# Patient Record
Sex: Male | Born: 1935 | Race: White | Hispanic: No | Marital: Married | Smoking: Former smoker
Health system: Southern US, Community
[De-identification: ages and names within clinical notes are randomized; demographics above are authoritative.]

## PROBLEM LIST (undated history)

## (undated) DIAGNOSIS — I714 Abdominal aortic aneurysm, without rupture, unspecified: Secondary | ICD-10-CM

## (undated) DIAGNOSIS — K5900 Constipation, unspecified: Secondary | ICD-10-CM

## (undated) DIAGNOSIS — K469 Unspecified abdominal hernia without obstruction or gangrene: Secondary | ICD-10-CM

## (undated) DIAGNOSIS — C4490 Unspecified malignant neoplasm of skin, unspecified: Secondary | ICD-10-CM

## (undated) DIAGNOSIS — J449 Chronic obstructive pulmonary disease, unspecified: Secondary | ICD-10-CM

## (undated) DIAGNOSIS — R7303 Prediabetes: Secondary | ICD-10-CM

## (undated) DIAGNOSIS — I77819 Aortic ectasia, unspecified site: Secondary | ICD-10-CM

## (undated) DIAGNOSIS — E039 Hypothyroidism, unspecified: Secondary | ICD-10-CM

## (undated) DIAGNOSIS — I10 Essential (primary) hypertension: Secondary | ICD-10-CM

## (undated) DIAGNOSIS — E785 Hyperlipidemia, unspecified: Secondary | ICD-10-CM

## (undated) DIAGNOSIS — E559 Vitamin D deficiency, unspecified: Secondary | ICD-10-CM

## (undated) DIAGNOSIS — I2510 Atherosclerotic heart disease of native coronary artery without angina pectoris: Secondary | ICD-10-CM

## (undated) DIAGNOSIS — M779 Enthesopathy, unspecified: Secondary | ICD-10-CM

## (undated) DIAGNOSIS — I739 Peripheral vascular disease, unspecified: Secondary | ICD-10-CM

## (undated) HISTORY — DX: Abdominal aortic aneurysm, without rupture: I71.4

## (undated) HISTORY — PX: PNEUMONECTOMY: SHX168

## (undated) HISTORY — DX: Essential (primary) hypertension: I10

## (undated) HISTORY — PX: PILONIDAL CYST / SINUS EXCISION: SUR543

## (undated) HISTORY — DX: Abdominal aortic aneurysm, without rupture, unspecified: I71.40

## (undated) HISTORY — DX: Enthesopathy, unspecified: M77.9

## (undated) HISTORY — DX: Peripheral vascular disease, unspecified: I73.9

## (undated) HISTORY — PX: KNEE SURGERY: SHX244

## (undated) HISTORY — PX: CORONARY ARTERY BYPASS GRAFT: SHX141

## (undated) HISTORY — DX: Unspecified abdominal hernia without obstruction or gangrene: K46.9

## (undated) HISTORY — PX: CATARACT EXTRACTION: SUR2

## (undated) HISTORY — DX: Chronic obstructive pulmonary disease, unspecified: J44.9

## (undated) HISTORY — DX: Prediabetes: R73.03

## (undated) HISTORY — DX: Aortic ectasia, unspecified site: I77.819

## (undated) HISTORY — DX: Unspecified malignant neoplasm of skin, unspecified: C44.90

## (undated) HISTORY — DX: Hypothyroidism, unspecified: E03.9

## (undated) HISTORY — DX: Hyperlipidemia, unspecified: E78.5

## (undated) HISTORY — DX: Vitamin D deficiency, unspecified: E55.9

## (undated) HISTORY — DX: Atherosclerotic heart disease of native coronary artery without angina pectoris: I25.10

---

## 2002-01-13 ENCOUNTER — Ambulatory Visit (HOSPITAL_COMMUNITY): Admission: RE | Admit: 2002-01-13 | Discharge: 2002-01-13 | Payer: Self-pay | Admitting: Ophthalmology

## 2002-02-14 ENCOUNTER — Ambulatory Visit (HOSPITAL_COMMUNITY): Admission: RE | Admit: 2002-02-14 | Discharge: 2002-02-15 | Payer: Self-pay | Admitting: Ophthalmology

## 2002-02-14 ENCOUNTER — Encounter: Payer: Self-pay | Admitting: Ophthalmology

## 2002-02-28 ENCOUNTER — Ambulatory Visit (HOSPITAL_COMMUNITY): Admission: RE | Admit: 2002-02-28 | Discharge: 2002-02-28 | Payer: Self-pay | Admitting: *Deleted

## 2002-11-09 ENCOUNTER — Ambulatory Visit (HOSPITAL_COMMUNITY): Admission: RE | Admit: 2002-11-09 | Discharge: 2002-11-09 | Payer: Self-pay | Admitting: Internal Medicine

## 2002-11-09 ENCOUNTER — Encounter: Payer: Self-pay | Admitting: Internal Medicine

## 2002-11-10 ENCOUNTER — Ambulatory Visit (HOSPITAL_COMMUNITY): Admission: RE | Admit: 2002-11-10 | Discharge: 2002-11-10 | Payer: Self-pay | Admitting: Internal Medicine

## 2002-11-10 ENCOUNTER — Encounter: Payer: Self-pay | Admitting: Internal Medicine

## 2002-12-14 ENCOUNTER — Ambulatory Visit (HOSPITAL_COMMUNITY): Admission: RE | Admit: 2002-12-14 | Discharge: 2002-12-14 | Payer: Self-pay | Admitting: Critical Care Medicine

## 2002-12-14 ENCOUNTER — Encounter (INDEPENDENT_AMBULATORY_CARE_PROVIDER_SITE_OTHER): Payer: Self-pay | Admitting: *Deleted

## 2002-12-14 ENCOUNTER — Encounter: Payer: Self-pay | Admitting: Critical Care Medicine

## 2002-12-28 ENCOUNTER — Encounter
Admission: RE | Admit: 2002-12-28 | Discharge: 2002-12-28 | Payer: Self-pay | Admitting: Thoracic Surgery (Cardiothoracic Vascular Surgery)

## 2002-12-28 ENCOUNTER — Encounter: Payer: Self-pay | Admitting: Thoracic Surgery (Cardiothoracic Vascular Surgery)

## 2003-01-17 ENCOUNTER — Encounter: Payer: Self-pay | Admitting: Thoracic Surgery

## 2003-01-18 ENCOUNTER — Encounter: Payer: Self-pay | Admitting: Thoracic Surgery

## 2003-01-18 ENCOUNTER — Encounter (INDEPENDENT_AMBULATORY_CARE_PROVIDER_SITE_OTHER): Payer: Self-pay | Admitting: Specialist

## 2003-01-18 ENCOUNTER — Inpatient Hospital Stay (HOSPITAL_COMMUNITY): Admission: RE | Admit: 2003-01-18 | Discharge: 2003-01-24 | Payer: Self-pay | Admitting: Thoracic Surgery

## 2003-01-19 ENCOUNTER — Encounter: Payer: Self-pay | Admitting: Thoracic Surgery

## 2003-01-20 ENCOUNTER — Encounter: Payer: Self-pay | Admitting: Thoracic Surgery

## 2003-01-21 ENCOUNTER — Encounter: Payer: Self-pay | Admitting: Thoracic Surgery

## 2003-01-22 ENCOUNTER — Encounter: Payer: Self-pay | Admitting: Thoracic Surgery

## 2003-01-23 ENCOUNTER — Encounter: Payer: Self-pay | Admitting: Thoracic Surgery

## 2003-02-01 ENCOUNTER — Encounter: Payer: Self-pay | Admitting: Thoracic Surgery

## 2003-02-01 ENCOUNTER — Encounter: Admission: RE | Admit: 2003-02-01 | Discharge: 2003-02-01 | Payer: Self-pay | Admitting: Thoracic Surgery

## 2003-02-22 ENCOUNTER — Encounter: Admission: RE | Admit: 2003-02-22 | Discharge: 2003-02-22 | Payer: Self-pay | Admitting: Thoracic Surgery

## 2003-02-22 ENCOUNTER — Encounter: Payer: Self-pay | Admitting: Thoracic Surgery

## 2003-04-26 ENCOUNTER — Encounter: Admission: RE | Admit: 2003-04-26 | Discharge: 2003-04-26 | Payer: Self-pay | Admitting: Thoracic Surgery

## 2003-07-26 ENCOUNTER — Encounter: Admission: RE | Admit: 2003-07-26 | Discharge: 2003-07-26 | Payer: Self-pay | Admitting: Thoracic Surgery

## 2003-10-25 ENCOUNTER — Encounter: Admission: RE | Admit: 2003-10-25 | Discharge: 2003-10-25 | Payer: Self-pay | Admitting: Thoracic Surgery

## 2004-01-05 ENCOUNTER — Encounter: Admission: RE | Admit: 2004-01-05 | Discharge: 2004-01-05 | Payer: Self-pay | Admitting: Internal Medicine

## 2004-05-06 IMAGING — CT CT CHEST W/ CM
1 of 2 series · 15 of 32 positions shown, 19 images · IV contrast (agent unspecified)
Comparison: none

FINDINGS
CLINICAL DATA: RIGHT UPPER LOBE MASS.
CON - NONE.
CT CHEST WITH CONTRAST
COMPARISON WITH PRIOR STUDY PERFORMED AT [HOSPITAL] ON 11/10/02.
SPICULATED MASS IN THE RIGHT UPPER LOBE HAS INCREASED SIGNIFICANTLY IN SIZE SINCE THE PRIOR EXAM.
IT NOW MEASURES APPROXIMATELY 4.4 X 3.2 CM IN SIZE.  THERE IS SOME CALCIFICATION WITHIN THAT AREA.
THE SPICULATED COMPONENTS DO EXTEND TO THE PLEURAL SURFACE.  I SUSPECT THIS REPRESENTS A SCAR
CARCINOMA.  THE PATIENT HAS SEVERE CHRONIC INTERSTITIAL AND OBSTRUCTIVE LUNG DISEASE WITH NUMEROUS
BLEBS.  NO FOCAL AREAS OF SEVERE CHRONIC INTERSTITIAL  DISEASE AT BOTH BASES POSTERIORLY AND APPEAR
WORSE THAN ON THE PRIOR STUDY.  HEART SIZE IS NORMAL.  THERE ARE  A FEW SMALL NODES IN THE
MEDIASTINUM IN THE PRECARINAL REGION WHICH APPEAR ESSENTIALLY UNCHANGED.  THERE IS A SINGLE NODE
ADJACENT TO THE RIGHT MAIN PULMONARY ARTERY ON IMAGE #26, WHICH IS A TYPICAL SITE FOR A LYMPH NODE
OFTEN SEEN IN NORMAL PATIENTS, THEREFORE, THIS IS NONSPECIFIC.
IMPRESSION
SIGNIFICANT INCREASE IN THE SIZE OF THE SPICULATED MASS IN THE RIGHT UPPER LOBE CONSISTENT WITH
SCAR CARCINOMA.  INCREASED DENSITY IN THE EXTENSIVE AREAS OF PERIPHERAL BLEB FORMATION IN THE
POSTERIOR ASPECTS OF BOTH LOWER LOBES.  THIS COULD REPRESENT SOME ACUTE INFECTION IN THOSE AREAS OF
SEVERE EMPHYSEMATOUS DISEASE.
COPY TO:  JANCY CANO, M.D.

[Series 2: — · axial · 0.74mm/px · z∈[-240,+35]mm · 15 of 65 slices shown, 19 images]
[im 5/65  mediastinal]
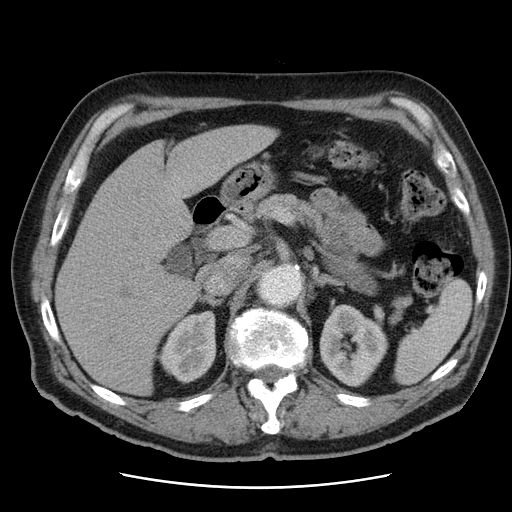
[im 5/65  lung]
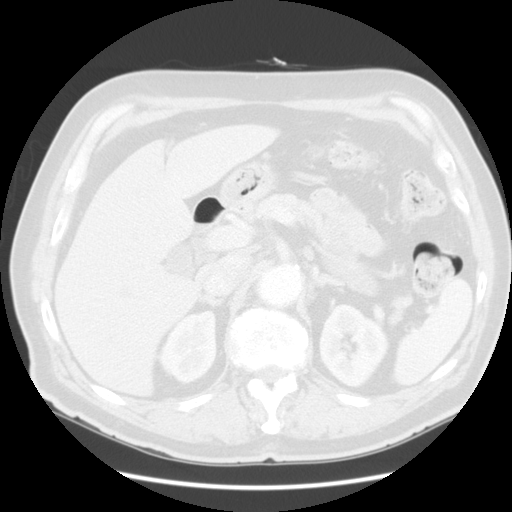
[im 9/65  lung]
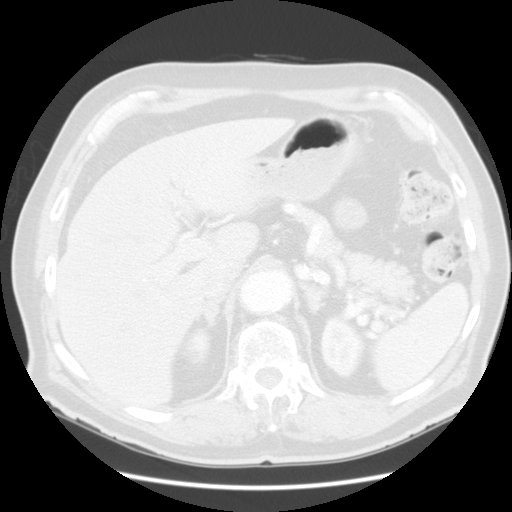
[im 13/65  lung]
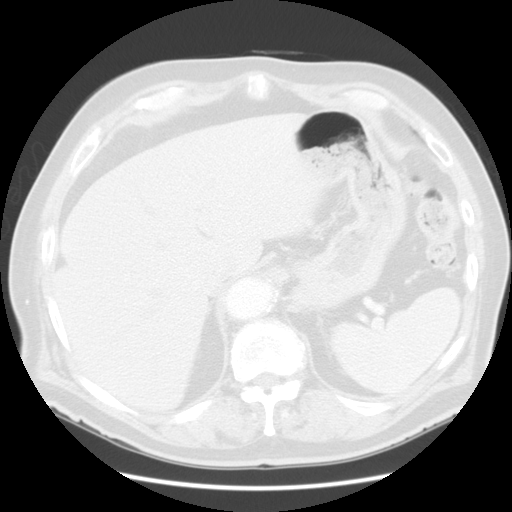
[im 18/65  lung]
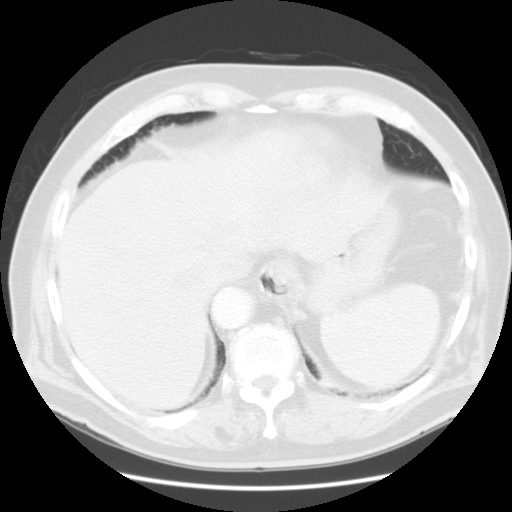
[im 22/65  mediastinal]
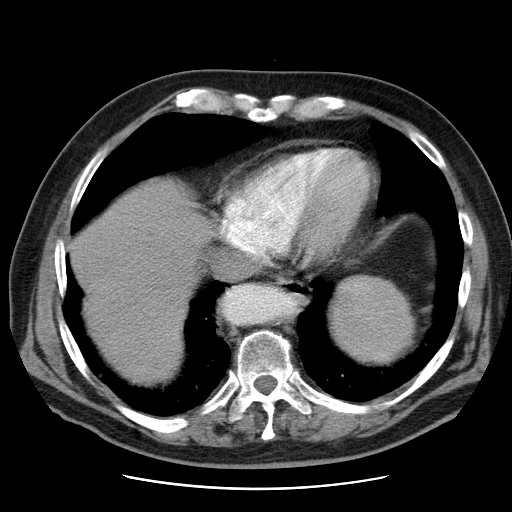
[im 22/65  lung]
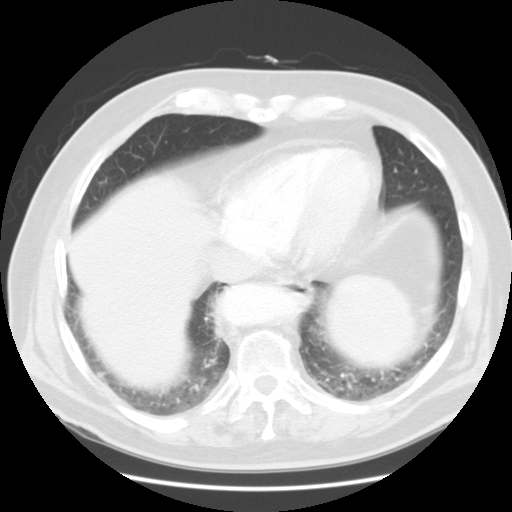
[im 26/65  lung]
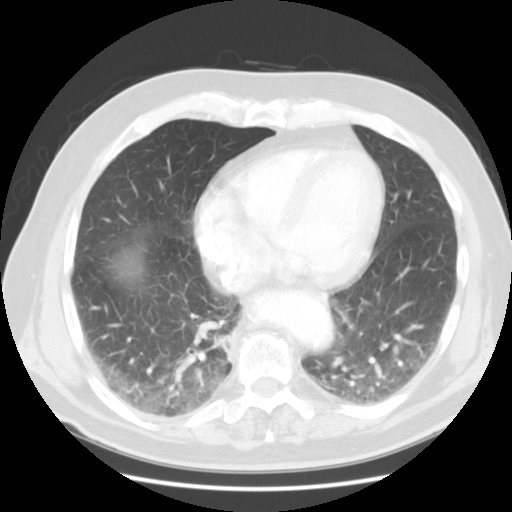
[im 30/65  lung]
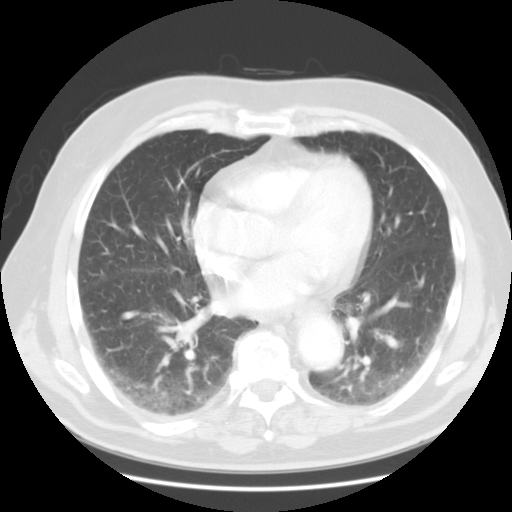
[im 33/65  lung]
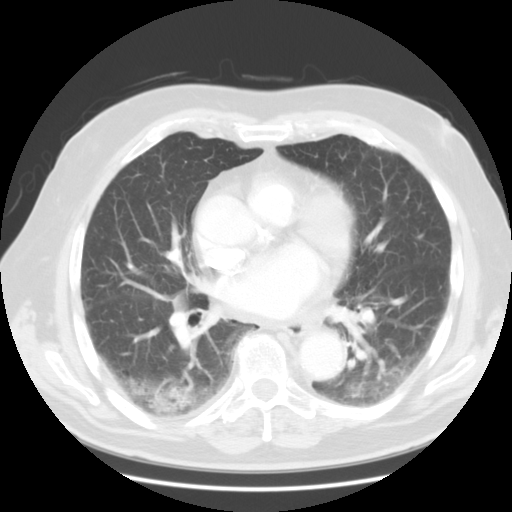
[im 35/65  mediastinal]
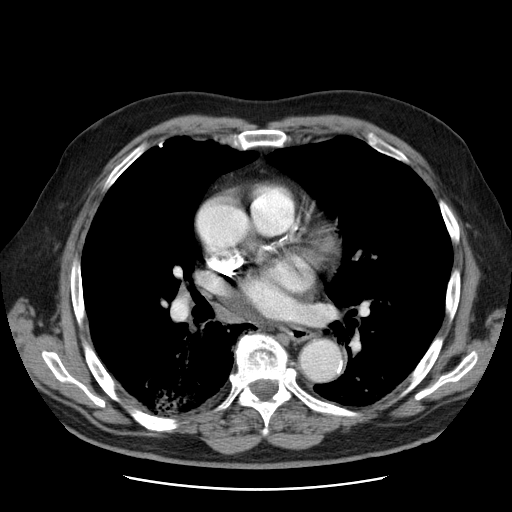
[im 35/65  lung]
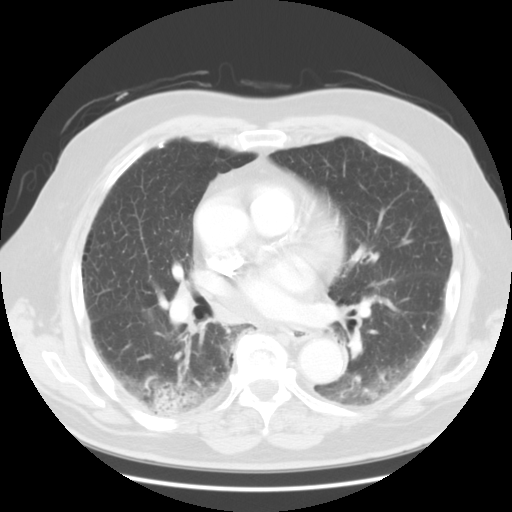
[im 39/65  lung]
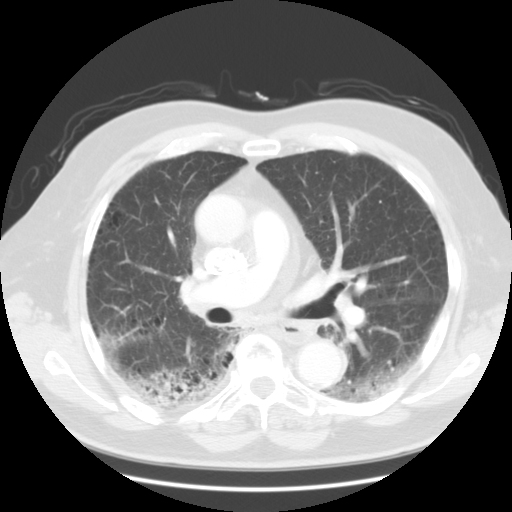
[im 43/65  lung]
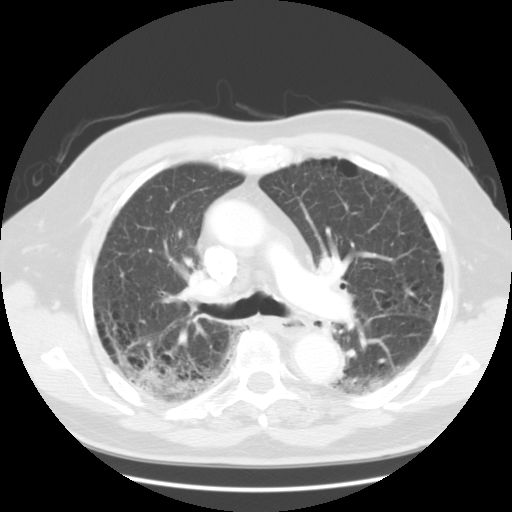
[im 47/65  lung]
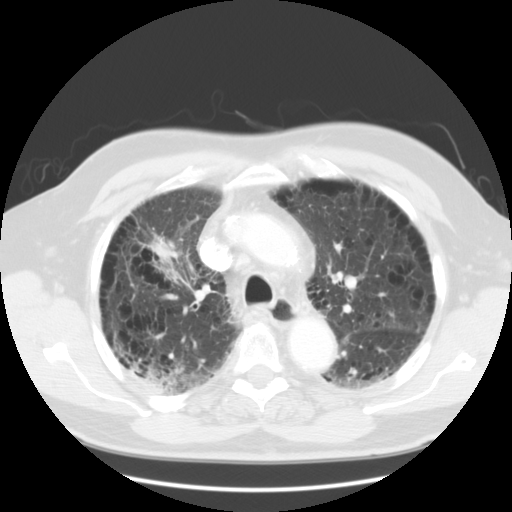
[im 52/65  mediastinal]
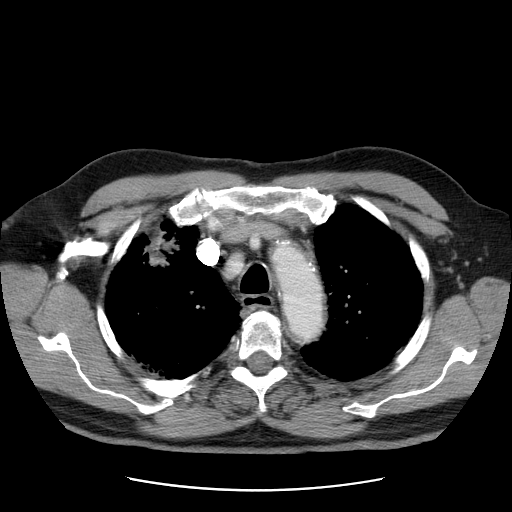
[im 52/65  lung]
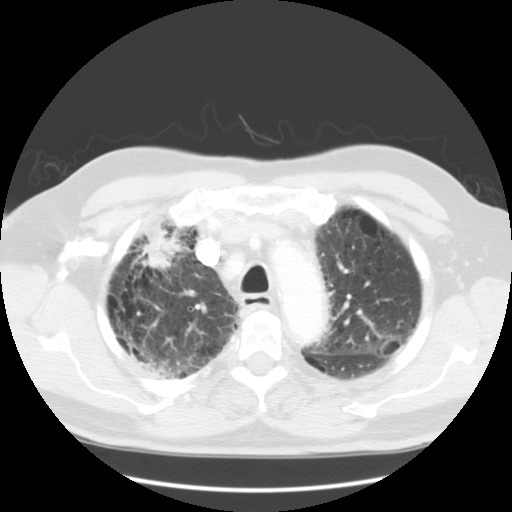
[im 56/65  lung]
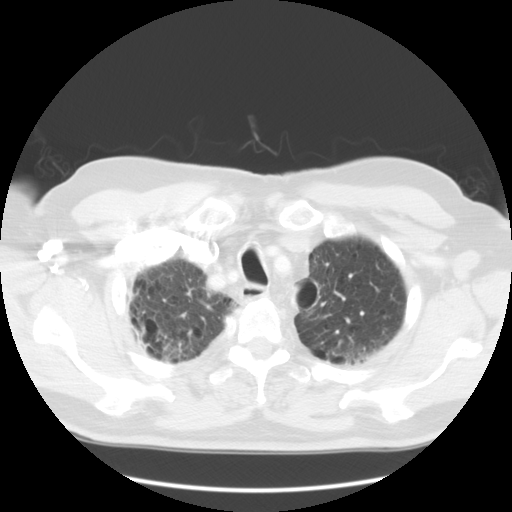
[im 60/65  lung]
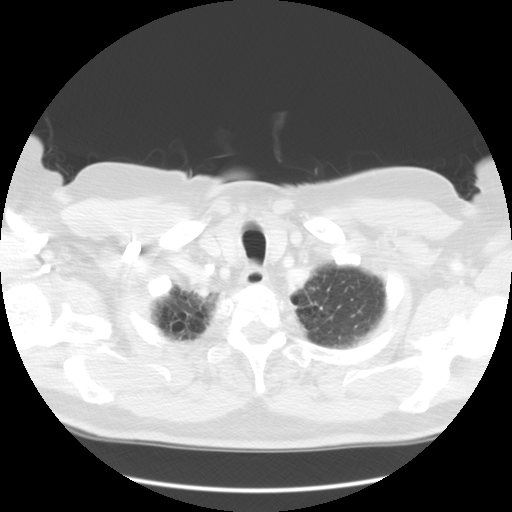

[15 of 32 positions shown; findings below may reference images not displayed]

## 2004-07-03 ENCOUNTER — Encounter: Admission: RE | Admit: 2004-07-03 | Discharge: 2004-07-03 | Payer: Self-pay | Admitting: Thoracic Surgery

## 2005-01-01 ENCOUNTER — Encounter: Admission: RE | Admit: 2005-01-01 | Discharge: 2005-01-01 | Payer: Self-pay | Admitting: Thoracic Surgery

## 2005-01-08 ENCOUNTER — Ambulatory Visit (HOSPITAL_COMMUNITY): Admission: RE | Admit: 2005-01-08 | Discharge: 2005-01-08 | Payer: Self-pay | Admitting: Specialist

## 2005-07-09 ENCOUNTER — Encounter: Admission: RE | Admit: 2005-07-09 | Discharge: 2005-07-09 | Payer: Self-pay | Admitting: Thoracic Surgery

## 2005-08-23 ENCOUNTER — Encounter: Admission: RE | Admit: 2005-08-23 | Discharge: 2005-08-23 | Payer: Self-pay | Admitting: Specialist

## 2005-09-26 ENCOUNTER — Ambulatory Visit: Payer: Self-pay | Admitting: Cardiology

## 2005-10-03 ENCOUNTER — Ambulatory Visit: Payer: Self-pay | Admitting: Critical Care Medicine

## 2005-10-22 ENCOUNTER — Ambulatory Visit: Payer: Self-pay

## 2005-10-31 ENCOUNTER — Ambulatory Visit: Payer: Self-pay | Admitting: Critical Care Medicine

## 2006-01-13 ENCOUNTER — Ambulatory Visit: Payer: Self-pay | Admitting: Pulmonary Disease

## 2006-01-13 ENCOUNTER — Inpatient Hospital Stay (HOSPITAL_COMMUNITY): Admission: RE | Admit: 2006-01-13 | Discharge: 2006-01-19 | Payer: Self-pay | Admitting: Specialist

## 2006-01-14 ENCOUNTER — Ambulatory Visit: Payer: Self-pay | Admitting: Physical Medicine & Rehabilitation

## 2006-01-16 ENCOUNTER — Ambulatory Visit: Payer: Self-pay | Admitting: Cardiology

## 2006-01-16 ENCOUNTER — Encounter: Payer: Self-pay | Admitting: Vascular Surgery

## 2006-01-16 ENCOUNTER — Encounter: Payer: Self-pay | Admitting: Cardiology

## 2006-02-04 ENCOUNTER — Ambulatory Visit: Payer: Self-pay | Admitting: Critical Care Medicine

## 2006-02-09 ENCOUNTER — Ambulatory Visit (HOSPITAL_COMMUNITY): Admission: RE | Admit: 2006-02-09 | Discharge: 2006-02-09 | Payer: Self-pay | Admitting: Specialist

## 2006-02-09 ENCOUNTER — Encounter: Payer: Self-pay | Admitting: Vascular Surgery

## 2007-12-01 ENCOUNTER — Ambulatory Visit (HOSPITAL_COMMUNITY): Admission: RE | Admit: 2007-12-01 | Discharge: 2007-12-01 | Payer: Self-pay | Admitting: Internal Medicine

## 2008-10-26 ENCOUNTER — Ambulatory Visit (HOSPITAL_COMMUNITY): Admission: RE | Admit: 2008-10-26 | Discharge: 2008-10-26 | Payer: Self-pay | Admitting: Internal Medicine

## 2008-11-01 ENCOUNTER — Encounter: Admission: RE | Admit: 2008-11-01 | Discharge: 2008-11-01 | Payer: Self-pay | Admitting: Internal Medicine

## 2008-11-09 ENCOUNTER — Ambulatory Visit (HOSPITAL_COMMUNITY): Admission: RE | Admit: 2008-11-09 | Discharge: 2008-11-09 | Payer: Self-pay | Admitting: Internal Medicine

## 2009-01-08 ENCOUNTER — Ambulatory Visit (HOSPITAL_COMMUNITY): Admission: RE | Admit: 2009-01-08 | Discharge: 2009-01-08 | Payer: Self-pay | Admitting: Internal Medicine

## 2009-01-10 ENCOUNTER — Inpatient Hospital Stay (HOSPITAL_COMMUNITY): Admission: EM | Admit: 2009-01-10 | Discharge: 2009-01-18 | Payer: Self-pay | Admitting: Emergency Medicine

## 2009-01-11 ENCOUNTER — Ambulatory Visit: Payer: Self-pay | Admitting: Surgery

## 2009-01-11 ENCOUNTER — Encounter: Payer: Self-pay | Admitting: Surgery

## 2009-02-06 ENCOUNTER — Ambulatory Visit: Payer: Self-pay | Admitting: Surgery

## 2009-02-06 ENCOUNTER — Encounter: Admission: RE | Admit: 2009-02-06 | Discharge: 2009-02-06 | Payer: Self-pay | Admitting: Surgery

## 2009-09-05 ENCOUNTER — Ambulatory Visit (HOSPITAL_COMMUNITY): Admission: RE | Admit: 2009-09-05 | Discharge: 2009-09-05 | Payer: Self-pay | Admitting: Internal Medicine

## 2009-09-05 ENCOUNTER — Encounter: Payer: Self-pay | Admitting: Cardiology

## 2009-09-20 ENCOUNTER — Encounter: Payer: Self-pay | Admitting: Cardiology

## 2009-10-03 DIAGNOSIS — E039 Hypothyroidism, unspecified: Secondary | ICD-10-CM

## 2009-10-05 ENCOUNTER — Ambulatory Visit: Payer: Self-pay | Admitting: Cardiology

## 2009-10-05 DIAGNOSIS — I739 Peripheral vascular disease, unspecified: Secondary | ICD-10-CM | POA: Insufficient documentation

## 2009-10-05 DIAGNOSIS — E782 Mixed hyperlipidemia: Secondary | ICD-10-CM

## 2009-10-05 DIAGNOSIS — I25810 Atherosclerosis of coronary artery bypass graft(s) without angina pectoris: Secondary | ICD-10-CM

## 2009-10-29 ENCOUNTER — Ambulatory Visit: Payer: Self-pay

## 2009-10-29 ENCOUNTER — Ambulatory Visit (HOSPITAL_COMMUNITY): Admission: RE | Admit: 2009-10-29 | Discharge: 2009-10-29 | Payer: Self-pay | Admitting: Cardiology

## 2009-10-29 ENCOUNTER — Encounter: Payer: Self-pay | Admitting: Cardiology

## 2009-10-29 ENCOUNTER — Ambulatory Visit: Payer: Self-pay | Admitting: Internal Medicine

## 2009-10-29 ENCOUNTER — Encounter (INDEPENDENT_AMBULATORY_CARE_PROVIDER_SITE_OTHER): Payer: Self-pay | Admitting: *Deleted

## 2009-11-05 ENCOUNTER — Encounter: Payer: Self-pay | Admitting: Cardiology

## 2009-11-30 ENCOUNTER — Ambulatory Visit: Payer: Self-pay | Admitting: Cardiology

## 2010-04-11 ENCOUNTER — Ambulatory Visit (HOSPITAL_COMMUNITY): Admission: RE | Admit: 2010-04-11 | Discharge: 2010-04-11 | Payer: Self-pay | Admitting: Internal Medicine

## 2010-06-15 ENCOUNTER — Encounter: Payer: Self-pay | Admitting: Thoracic Surgery

## 2010-06-16 ENCOUNTER — Encounter: Payer: Self-pay | Admitting: Thoracic Surgery

## 2010-06-17 ENCOUNTER — Encounter: Payer: Self-pay | Admitting: Internal Medicine

## 2010-06-25 NOTE — Assessment & Plan Note (Signed)
Summary: np6/chf/edema/htn/jml   Visit Type:  Initial Consult Primary Provider:  Berenice Primas, PAc  CC:  CAD.  History of Present Illness: The patient is referred for evaluation of dyspnea and lower extremity edema. He has a history of coronary artery disease status post recent CABG. He has had some increased lower extremity edema. This has been slowly progressive. He does have chronic dyspnea. He says he's been short of breath since lung resection for non-small cell cancer. He wears O2 as needed and at night. He gets dyspneic walking a moderate distance on level ground. He does not however describe PND or orthopnea. He does not describe chest pressure, neck or arm discomfort. He does not report palpitations, presyncope or syncope. He did have some labs recently but I do not have the BNP results.  Of note he is limited by joint pains.  Current Medications (verified): 1)  Coreg 3.125 Mg Tabs (Carvedilol) .Marland Kitchen.. 1 By Mouth Two Times A Day 2)  Ramipril 2.5 Mg Caps (Ramipril) .Marland Kitchen.. 1 By Mouth Daily 3)  Synthroid 200 Mcg Tabs (Levothyroxine Sodium) .Marland Kitchen.. 1 By Mouth Daily 4)  Aldactone 25 Mg Tabs (Spironolactone) .Marland Kitchen.. 1 By Mouth Daily 5)  Simvastatin 40 Mg Tabs (Simvastatin) .Marland Kitchen.. 1 By Mouth Daiy 6)  Furosemide 40 Mg Tabs (Furosemide) .Marland Kitchen.. 1 By Mouth Daily 7)  Vitamin D 1000 Unit Tabs (Cholecalciferol) .Marland Kitchen.. 1 By Mouth Daily  Allergies (verified): No Known Drug Allergies  Past History:  Past Medical History:  1. Hypothyroidism.  2. 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  3. Hiatal hernia with reflux.  4. Longstanding back trouble including bone spurs and fused vertebrae.  He     is treated by Dr. Thomasena Edis.  5. Left knee pain status post several knee surgeries also by Dr. Thomasena Edis.  6. Aortic ectasia, no abdominal aortic aneurysm.  He is followed for this by     Dr. Gretta Began.  7.  PVD (90% left iliac stenosis)  Past Surgical History:  1.  Right partial pneumonectomy.  2.  Knee arthroscopies.  3.  Cataracts bilaterally.  4.  Pilonidal cysts.  5.  CABG  using a left internal mammary       artery graft to left anterior descending coronary artery, saphenous       vein graft to intermediate coronary artery, saphenous vein graft to       right coronary artery.  Endoscopic vein harvesting from left leg       was done.   Family History: Negative for coronary artery disease.  Social History:  Mr. Draheim has been married for the last 40 years.  He has one  daughter.  He is a retired Chief Technology Officer.  He quit smoking  cigarettes July 2004.  Previous to this, he did  smoke a pack a day for 60 years.  Did not drink alcohol.  He lives with his wife in a multilevel dwelling.  He drives.   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  Review of Systems       Positive for tinnitus, reflux. Otherwise as stated in the history of present illness negative for all other systems.  Vital Signs:  Patient profile:   77 year old male Height:      72 inches Weight:      271 pounds BMI:     36.89 Pulse rate:   68 / minute Resp:  16 per minute BP sitting:   160 / 88  (right arm)  Vitals Entered By: Marrion Coy, CNA (Oct 05, 2009 11:44 AM)  Physical Exam  General:  Well developed, well nourished, in no acute distress. Head:  normocephalic and atraumatic Eyes:  PERRLA/EOM intact; conjunctiva and lids normal. Mouth:  Teeth, gums and palate normal. Oral mucosa normal. Neck:  Neck supple, no JVD. No masses, thyromegaly or abnormal cervical nodes. Chest Wall:  Well-healed sternotomy scar Lungs:  Mildly reduced lung sounds, no wheezing, no crackles Abdomen:  Bowel sounds positive; abdomen soft and non-tender without masses, organomegaly, or hernias noted. No hepatosplenomegaly. Msk:  Back normal, normal gait. Muscle strength and tone normal. Extremities:  No clubbing or cyanosis. Neurologic:  Alert and oriented x 3. Skin:  Intact  without lesions or rashes. Cervical Nodes:  no significant adenopathy Axillary Nodes:  no significant adenopathy Inguinal Nodes:  no significant adenopathy Psych:  Normal affect.   Detailed Cardiovascular Exam  Neck    Carotids: Carotids full and equal bilaterally without bruits.      Neck Veins: Normal, no JVD.    Heart    Inspection: no deformities or lifts noted.      Palpation: normal PMI with no thrills palpable.      Auscultation: regular rate and rhythm, S1, S2 without murmurs, rubs, gallops, or clicks.    Vascular    Abdominal Aorta: no palpable masses, pulsations, or audible bruits.      Femoral Pulses: diminished right femoral pulse and diminshed left femoral pulse.      Pedal Pulses: diminished right dorsalis pedis pulse and diminished left dorsalis pedis pulse.      Radial Pulses: normal radial pulses bilaterally.      Peripheral Circulation: no clubbing, cyanosis, or edema noted with normal capillary refill.     EKG  Procedure date:  10/05/2009  Findings:      Sinus rhythm, rate 66, no acute ST-T wave changes  Impression & Recommendations:  Problem # 1:  DYSPNEA ON EXERTION (ICD-786.09) The patient has dyspnea but has had chronic shortness of breath since his lung surgery. I looked back through old records and note his ejection fraction was well-preserved at catheter I do not see an echocardiogram done at the time of surgery. The last one was 2008. BNP results are pending. I would like to order an echocardiogram to further evaluate his ejection fraction. He is reluctant to take more medications but certainly if his EF is reduced we would uptitrate his already good medical regimen. Orders: Echocardiogram (Echo)  Problem # 2:  CAD (ICD-414.00) He has no ongoing angina. He should continue with risk reduction.  Problem # 3:  PVD (ICD-443.9) He does have left iliac stenosis but isn't describing claudication. He apparently has followup by the vascular surgeons for  other issues and I will defer workup.  Problem # 4:  DYSLIPIDEMIA (ICD-272.4) The goal LDL should be less than 100 with an HDL greater than 40. I would be happy to review this and participate in the followup of this.  Patient Instructions: 1)  Your physician recommends that you schedule a follow-up appointment in: 2 months 2)  Your physician recommends that you continue on your current medications as directed. Please refer to the Current Medication list given to you today. 3)  Your physician has requested that you have an echocardiogram.  Echocardiography is a painless test that uses sound waves to create images of your heart. It provides your doctor with  information about the size and shape of your heart and how well your heart's chambers and valves are working.  This procedure takes approximately one hour. There are no restrictions for this procedure.  Appended Document: np6/chf/edema/htn/jml Pt does not want statin.  No further meds suggested.  Appended Document: np6/chf/edema/htn/jml wife aware, no new meds at this time.

## 2010-06-25 NOTE — Progress Notes (Signed)
Summary: [facility]  [facility] Note   Imported By: Roderic Ovens 10/25/2009 12:12:54  _____________________________________________________________________  External Attachment:    Type:   Image     Comment:   External Document

## 2010-06-25 NOTE — Assessment & Plan Note (Signed)
Summary: 2 month rov/sl   Visit Type:  Follow-up Primary Provider:  Berenice Primas, PAc  CC:  CAD.  History of Present Illness: The returns for followup of his known coronary disease. Since I last saw him he has had no new problems. I did try to switch him to Lipitor for better lipid control. However, turns out he's not taking any cholesterol medicines as he thinks they cause muscle aches. He has chronic joint pain and severe arthritis and is limited by this. He has chronic shortness of breath and does use p.r.n. oxygen but doesn't think that this is any different than previous. He denies any new PND or orthopnea. He denies any new palpitations, presyncope or syncope. This had chronic lower extremity swelling and doesn't think this is different. He has had no new weight gain. He denies chest pressure, neck or arm discomfort.  Current Medications (verified): 1)  Coreg 3.125 Mg Tabs (Carvedilol) .Marland Kitchen.. 1 By Mouth Two Times A Day 2)  Ramipril 2.5 Mg Caps (Ramipril) .Marland Kitchen.. 1 By Mouth Daily 3)  Synthroid 200 Mcg Tabs (Levothyroxine Sodium) .Marland Kitchen.. 1 By Mouth Daily 4)  Simvastatin 40 Mg Tabs (Simvastatin) .... Hold 5)  Furosemide 40 Mg Tabs (Furosemide) .... 2 By Mouth Daily 6)  Vitamin D 1000 Unit Tabs (Cholecalciferol) .Marland Kitchen.. 1 By Mouth Daily 7)  Klor-Con M20 20 Meq Cr-Tabs (Potassium Chloride Crys Cr) .... 1/2 By Mouth Daily 8)  Tramadol Hcl 50 Mg Tabs (Tramadol Hcl) .... As Needed 9)  Ultimate Pro Support .Marland Kitchen.. 1 By Mouth Dialy 10)  Methocarbamol 500 Mg Tabs (Methocarbamol) .... As Needed 11)  Vitamin B Complex-C   Caps (B Complex-C) .Marland Kitchen.. 1 By Mouth Daily 12)  Fish Oil   Oil (Fish Oil) .Marland Kitchen.. 1 By Mouth Dialy 13)  Aldactone 25 Mg Tabs (Spironolactone) .Marland Kitchen.. 1 By Mouth Daily 14)  Spiriva Handihaler 18 Mcg Caps (Tiotropium Bromide Monohydrate) .... As Directed 15)  Androgel 25 Mg/2.5gm Gel (Testosterone) .... As Directed 16)  Nitrolingual 0.4 Mg/spray Soln (Nitroglycerin) .... As Directed  Allergies  (verified): No Known Drug Allergies  Past History:  Past Medical History:  1. Hypothyroidism.  2. 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  3. Hiatal hernia with reflux.  4. Longstanding back trouble including bone spurs and fused vertebrae.  He     is treated by Dr. Thomasena Edis.  5. Left knee pain status post several knee surgeries also by Dr. Thomasena Edis.  6. Aortic ectasia, no abdominal aortic aneurysm.  He is followed for this by     Dr. Gretta Began.  7.  PVD (90% left iliac stenosis)  8. CAD  Past Surgical History:  1.  Right partial pneumonectomy.  2.  Knee arthroscopies.  3.  Cataracts bilaterally.  4.  Pilonidal cysts.  5.  CABG  using a left internal mammary       artery graft to left anterior descending coronary artery, saphenous       vein graft to intermediate coronary artery, saphenous vein graft to       right coronary artery.  Endoscopic vein harvesting from left leg         Review of Systems       As stated in the HPI and negative for all other systems.   Vital Signs:  Patient profile:   77 year old male Height:      72 inches Weight:      274  pounds BMI:     37.30 Pulse rate:   60 / minute Resp:     18 per minute BP sitting:   124 / 66  (right arm)  Vitals Entered By: Marrion Coy, CNA (November 30, 2009 10:02 AM)  Physical Exam  General:  Well developed, well nourished, in no acute distress. Head:  normocephalic and atraumatic Eyes:  PERRLA/EOM intact; conjunctiva and lids normal. Neck:  Neck supple, no JVD. No masses, thyromegaly or abnormal cervical nodes. Chest Wall:  Well-healed sternotomy scar Lungs:  Mildly reduced lung sounds, no wheezing, no crackles Abdomen:  Bowel sounds positive; abdomen soft and non-tender without masses, organomegaly, or hernias noted. No hepatosplenomegaly. Msk:  Back normal, normal gait. Muscle strength and tone normal. Extremities:  No clubbing or cyanosis. Skin:  Intact  without lesions or rashes. Cervical Nodes:  no significant adenopathy Psych:  Normal affect.   Detailed Cardiovascular Exam  Neck    Carotids: Carotids full and equal bilaterally without bruits.      Neck Veins: Normal, no JVD.    Heart    Inspection: no deformities or lifts noted.      Palpation: normal PMI with no thrills palpable.      Auscultation: regular rate and rhythm, S1, S2 without murmurs, rubs, gallops, or clicks.    Vascular    Abdominal Aorta: no palpable masses, pulsations, or audible bruits.      Femoral Pulses: diminished right femoral pulse and diminshed left femoral pulse.      Pedal Pulses: diminished right dorsalis pedis pulse and diminished left dorsalis pedis pulse.      Radial Pulses: normal radial pulses bilaterally.      Peripheral Circulation: no clubbing, cyanosis, or edema noted with normal capillary refill.     Impression & Recommendations:  Problem # 1:  CAD (ICD-414.00) He has no new symptoms. He is very much looking to simplify his medical regimen. I not sure he is getting tremendous benefit from this low dose of beta blocker and so I will discontinue this. He will remain on the other meds as listed.  Problem # 2:  DYSLIPIDEMIA (ICD-272.4) He is greatly influenced somewhat he will take by friends and neighbors. He does not want to take a statin. However, at least for now, he agrees to try pravastatin 80 mg daily. His LDL was 120 recently and I would like to get this less than 100.  Problem # 3:  DYSPNEA ON EXERTION (ICD-786.09) I think this is multifactorial and no further change in therapy or evaluation is needed.  Patient Instructions: 1)  Your physician recommends that you schedule a follow-up appointment in: 6 months with Dr Antoine Poche 2)  Your physician has recommended you make the following change in your medication: Stop Simvastatin and Carvedilol, start Pravastatin 80 mg daily Prescriptions: FUROSEMIDE 40 MG TABS (FUROSEMIDE) 2 by mouth  daily  #60 x 11   Entered by:   Charolotte Capuchin, RN   Authorized by:   Rollene Rotunda, MD, [facility]   Signed by:   Charolotte Capuchin, RN on 11/30/2009   Method used:   Electronically to        [facility]  [phone]* (retail)       [address]       [place], [place]  [address]       Ph: [phone] or [phone]       Fax: [phone]   RxID:  [id number] ALDACTONE 25 MG TABS (SPIRONOLACTONE) 1 by mouth daily  #30 x 11   Entered by:   Charolotte Capuchin, RN   Authorized by:   Rollene Rotunda, MD, [facility]   Signed by:   Charolotte Capuchin, RN on 11/30/2009   Method used:   Electronically to        [facility]  [phone]* (retail)       [address]       [place], [place]  [address]       Ph: [phone] or [phone]       Fax: [phone]   RxID:   [id number] KLOR-CON M20 20 MEQ CR-TABS (POTASSIUM CHLORIDE CRYS CR) 1/2 by mouth daily  #30 x 11   Entered by:   Charolotte Capuchin, RN   Authorized by:   Rollene Rotunda, MD, [facility]   Signed by:   Charolotte Capuchin, RN on 11/30/2009   Method used:   Electronically to        [facility]  [phone]* (retail)       [address]       [place], [place]  [address]       Ph: [phone] or [phone]       Fax: [phone]   RxID:   [id number] RAMIPRIL 2.5 MG CAPS (RAMIPRIL) 1 by mouth daily  #30 x 11   Entered by:   Charolotte Capuchin, RN   Authorized by:   Rollene Rotunda, MD, [facility]   Signed by:   Charolotte Capuchin, RN on 11/30/2009   Method used:   Electronically to        [facility]  [phone]* (retail)       [address]       [place], [place]  [address]       Ph: [phone] or [phone]       Fax: [phone]   RxID:   [id number] PRAVASTATIN SODIUM 80 MG TABS (PRAVASTATIN SODIUM) one every evening  #30 x 11   Entered by:   Charolotte Capuchin,  RN   Authorized by:   Rollene Rotunda, MD, [facility]   Signed by:   Charolotte Capuchin, RN on 11/30/2009   Method used:   Electronically to        [facility]  [phone]* (retail)       [address]       [place], [place]  [address]       Ph: [phone] or [phone]       Fax: [phone]   RxID:   [id number] SIMVASTATIN 80 MG TABS (SIMVASTATIN) one every evening  #30 x 11   Entered by:   Charolotte Capuchin, RN   Authorized by:   Rollene Rotunda, MD, [facility]   Signed by:   Charolotte Capuchin, RN on 11/30/2009   Method used:   Electronically to        [facility]  [phone]* (retail)       [address]       [place], [place]  [address]       Ph: [phone] or [phone]       Fax: [phone]   RxID:   [id number]  I have reviewed and approved all prescriptions at the time of this visit.

## 2010-06-28 NOTE — Letter (Signed)
Summary: Outpatient Coinsurance Notice  Outpatient Coinsurance Notice   Imported By: Marylou Mccoy 11/01/2009 11:42:38  _____________________________________________________________________  External Attachment:    Type:   Image     Comment:   External Document

## 2010-07-10 ENCOUNTER — Telehealth: Payer: Self-pay | Admitting: Cardiology

## 2010-07-23 NOTE — Progress Notes (Signed)
Summary: surgery risk   J[facility] needs to call Dr Ezzard Standing [phone]  Phone Note From Other Clinic   Caller: dr. Narda Bonds    [phone] Request: Talk with Nurse Summary of Call: wants to know the surgery risk for pt. pt has appt to see  dr.newman in 3 week. md is aware of pt last appt in 2011 Initial call taken by: Lorne Skeens,  July 10, 2010 2:26 PM  Follow-up for Phone Call        Dr Ezzard Standing aware that Dr Edna Lions is not in the office this week.  Dr Ezzard Standing just wants to discuss with [facility] before the pt comes in to see him.  Will forward to MD. Follow-up by: Charolotte Capuchin, RN,  July 10, 2010 2:52 PM  Additional Follow-up for Phone Call Additional follow up Details #1::        Discussed with Dr. Ezzard Standing.  The patient is at acceptable risk for surgery from a cardiovascular standpoint.  No further testing is indicated. Additional Follow-up by: Rollene Rotunda, MD, [facility],  July 18, 2010 1:57 PM

## 2010-08-12 ENCOUNTER — Encounter: Payer: Self-pay | Admitting: Cardiology

## 2010-08-12 ENCOUNTER — Telehealth: Payer: Self-pay | Admitting: Cardiology

## 2010-08-12 ENCOUNTER — Ambulatory Visit (INDEPENDENT_AMBULATORY_CARE_PROVIDER_SITE_OTHER): Payer: Medicare Other | Admitting: Cardiology

## 2010-08-12 ENCOUNTER — Other Ambulatory Visit: Payer: Self-pay | Admitting: Cardiology

## 2010-08-12 DIAGNOSIS — R0609 Other forms of dyspnea: Secondary | ICD-10-CM

## 2010-08-12 DIAGNOSIS — I2510 Atherosclerotic heart disease of native coronary artery without angina pectoris: Secondary | ICD-10-CM

## 2010-08-12 DIAGNOSIS — Z01810 Encounter for preprocedural cardiovascular examination: Secondary | ICD-10-CM

## 2010-08-12 DIAGNOSIS — I200 Unstable angina: Secondary | ICD-10-CM

## 2010-08-12 LAB — CBC WITH DIFFERENTIAL/PLATELET
Basophils Relative: 0.6 % (ref 0.0–3.0)
Eosinophils Relative: 1.8 % (ref 0.0–5.0)
Hemoglobin: 14.1 g/dL (ref 13.0–17.0)
Lymphs Abs: 4.9 10*3/uL — ABNORMAL HIGH (ref 0.7–4.0)
MCHC: 34.6 g/dL (ref 30.0–36.0)
MCV: 89.2 fl (ref 78.0–100.0)
Monocytes Absolute: 1 10*3/uL (ref 0.1–1.0)
Neutrophils Relative %: 41.7 % — ABNORMAL LOW (ref 43.0–77.0)

## 2010-08-12 LAB — BASIC METABOLIC PANEL
CO2: 24 mEq/L (ref 19–32)
Calcium: 9.1 mg/dL (ref 8.4–10.5)
Chloride: 103 mEq/L (ref 96–112)
GFR: 83.33 mL/min (ref >60.00)
Sodium: 137 mEq/L (ref 135–145)

## 2010-08-12 LAB — PROTIME-INR: INR: 0.9 ratio (ref 0.8–1.0)

## 2010-08-12 LAB — APTT: aPTT: 29.3 s — ABNORMAL HIGH (ref 21.7–28.8)

## 2010-08-15 ENCOUNTER — Inpatient Hospital Stay (HOSPITAL_BASED_OUTPATIENT_CLINIC_OR_DEPARTMENT_OTHER)
Admission: RE | Admit: 2010-08-15 | Discharge: 2010-08-15 | Disposition: A | Discharge status: Home / Self Care | Payer: Medicare Other | Source: Ambulatory Visit | Attending: Cardiology | Admitting: Cardiology

## 2010-08-15 DIAGNOSIS — Z951 Presence of aortocoronary bypass graft: Secondary | ICD-10-CM | POA: Insufficient documentation

## 2010-08-15 DIAGNOSIS — R079 Chest pain, unspecified: Secondary | ICD-10-CM | POA: Insufficient documentation

## 2010-08-15 DIAGNOSIS — I2510 Atherosclerotic heart disease of native coronary artery without angina pectoris: Secondary | ICD-10-CM | POA: Insufficient documentation

## 2010-08-22 NOTE — Progress Notes (Signed)
Summary: Need pt seen sooner  Phone Note From Other Clinic   Caller: Dr.Mckeown/[phone] Lurena Joiner Summary of Call: Need pt seen [facility] for EKG changes ,palp,sweating, jaw pain ,chest pains Initial call taken by: Judie Grieve,  August 12, 2010 10:25 AM  Follow-up for Phone Call        per Lurena Joiner this weekend -  yesterday  esp - pt c/o heart attack symptoms with EKG changes.  Per Dr [facility] Lions - add onto schedule today.  They will fax [place] the EKG from today. Phone [phone] Spoke with pt -  s/s came and went for over 15 mins.  SOB, sweating, jaw pain and nausea.  Discomfort went away on its own after taking ASA and drinking a shot of alchol.  Not pain now. Follow-up by: Charolotte Capuchin, RN,  August 12, 2010 11:55 AM

## 2010-08-22 NOTE — Assessment & Plan Note (Signed)
Summary: chest pain EKG changes  PF,RN   Visit Type:  work in Primary Provider:  Berenice Primas, PAc  CC:  chest pain.  History of Present Illness: The patient was added on the schedule. He has a history of coronary disease with severe left main stenting status post CABG in 2010.  He reports an episode of chest pain.  This happened at 4 AM 2 days prior to this appointment. He had a scheduled appointment with his primary. He described chest discomfort similar to his previous angina. He was accompanied by jaw discomfort, nausea and diaphoresis. He did not have arm discomfort. He did take nitroglycerin and is not sure whether this helped. He thought his symptoms lasted for about 5-10 minutes. He has had no symptoms since that time. He had no palpitations, presyncope or syncope. He has had no new shortness of breath though he is limited in his activities. He's had no new PND or orthopnea. He is mostly bothered by significant lower extremity and cramping. This is frequent.  Current Medications (verified): 1)  Synthroid 200 Mcg Tabs (Levothyroxine Sodium) .Marland Kitchen.. 1 By Mouth Daily 2)  Furosemide 40 Mg Tabs (Furosemide) .... 2 By Mouth Daily 3)  Vitamin D 1000 Unit Tabs (Cholecalciferol) .... 6000 Units Per Day 4)  Klor-Con M20 20 Meq Cr-Tabs (Potassium Chloride Crys Cr) .... 1/2 By Mouth Daily 5)  Vitamin B Complex-C   Caps (B Complex-C) .Marland Kitchen.. 1 By Mouth Daily 6)  Fish Oil   Oil (Fish Oil) .Marland Kitchen.. 1 By Mouth Dialy 7)  Aldactone 25 Mg Tabs (Spironolactone) .Marland Kitchen.. 1 By Mouth Daily 8)  Spiriva Handihaler 18 Mcg Caps (Tiotropium Bromide Monohydrate) .... As Directed 9)  Nitrolingual 0.4 Mg/spray Soln (Nitroglycerin) .... As Directed 10)  Enalapril Maleate 20 Mg Tabs (Enalapril Maleate) .... Once Daily 11)  Aspirin 81 Mg Tbec (Aspirin) .... Take One Tablet By Mouth Daily  Allergies (verified): No Known Drug Allergies  Past History:  Past Medical History: Reviewed history from 11/30/2009 and no changes  required.  1. Hypothyroidism.  2. 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  3. Hiatal hernia with reflux.  4. Longstanding back trouble including bone spurs and fused vertebrae.  He     is treated by Dr. Thomasena Edis.  5. Left knee pain status post several knee surgeries also by Dr. Thomasena Edis.  6. Aortic ectasia, no abdominal aortic aneurysm.  He is followed for this by     Dr. Gretta Began.  7.  PVD (90% left iliac stenosis)  8. CAD  Past Surgical History: Reviewed history from 11/30/2009 and no changes required.  1.  Right partial pneumonectomy.  2.  Knee arthroscopies.  3.  Cataracts bilaterally.  4.  Pilonidal cysts.  5.  CABG  using a left internal mammary       artery graft to left anterior descending coronary artery, saphenous       vein graft to intermediate coronary artery, saphenous vein graft to       right coronary artery.  Endoscopic vein harvesting from left leg         Family History: Reviewed history from 10/05/2009 and no changes required. Negative for coronary artery disease.  Social History: Reviewed history from 10/05/2009 and no changes required.  Mr. Skoczylas has been married for the last 40 years.  He has one  daughter.  He is a retired Chief Technology Officer.  He quit smoking  cigarettes July  2004.  Previous to this, he did  smoke a pack a day for 60 years.  Did not drink alcohol.  He lives with his wife in a multilevel dwelling.  He drives.   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  Review of Systems       As stated in the HPI and negative for all other systems.   Vital Signs:  Patient profile:   77 year old male Height:      72 inches Weight:      270 pounds BMI:     36.75 Pulse rate:   82 / minute Resp:     20 per minute BP sitting:   158 / 94  (right arm) Cuff size:   regular  Vitals Entered By: Celestia Khat, CMA (August 12, 2010 1:03 PM)  Physical Exam  General:  Well developed, well  nourished, in no acute distress. Head:  normocephalic and atraumatic Eyes:  PERRLA/EOM intact; conjunctiva and lids normal. Mouth:  Teeth, gums and palate normal. Oral mucosa normal. Neck:  Neck supple, no JVD. No masses, thyromegaly or abnormal cervical nodes. Chest Wall:  Well-healed sternotomy scar Lungs:  Mildly reduced lung sounds, no wheezing, no crackles Abdomen:  Bowel sounds positive; abdomen soft and non-tender without masses, organomegaly, or hernias noted. No hepatosplenomegaly. Msk:  Back normal, normal gait. Muscle strength and tone normal. Extremities:  No clubbing or cyanosis. Neurologic:  Alert and oriented x 3. Skin:  Intact without lesions or rashes. Cervical Nodes:  no significant adenopathy Inguinal Nodes:  no significant adenopathy Psych:  Normal affect.   Detailed Cardiovascular Exam  Neck    Carotids: Carotids full and equal bilaterally without bruits.      Neck Veins: Normal, no JVD.    Heart    Inspection: no deformities or lifts noted.      Palpation: normal PMI with no thrills palpable.      Auscultation: regular rate and rhythm, S1, S2 without murmurs, rubs, gallops, or clicks.    Vascular    Abdominal Aorta: no palpable masses, pulsations, or audible bruits.      Femoral Pulses: diminished right femoral pulse and diminshed left femoral pulse.      Pedal Pulses: diminished right dorsalis pedis pulse and diminished left dorsalis pedis pulse.      Radial Pulses: normal radial pulses bilaterally.      Peripheral Circulation: no clubbing, cyanosis, or edema noted with normal capillary refill.     Impression & Recommendations:  Problem # 1:  CAD (ICD-414.00) I am quite concerned about the symptoms. I did review his catheterization previously. At this point given the resting pain I think repeat catheterization is indicated. I discussed this with him including risks benefits and he agrees to proceed. Orders: TLB-BMP (Basic Metabolic Panel-BMET)  (80048-METABOL) TLB-CBC Platelet - w/Differential (85025-CBCD) TLB-PTT (85730-PTTL) TLB-PT (Protime) (85610-PTP)  Problem # 2:  DYSPNEA ON EXERTION (ICD-786.09) This is chronic and related at least in some part to chronic lung problems. However, this could be an anginal as well and will be evaluated as above.  Problem # 3:  PVD (ICD-443.9) He has known severe left sided lower extremity disease. No change in therapy is indicated.  Patient Instructions: 1)  Your physician recommends that you have lab work today for cath work up 2)  Your physician has recommended you make the following change in your medication: start Isosorbide 60 mg once daily 3)  Your physician has requested that you have  a cardiac catheterization.  Cardiac catheterization is used to diagnose and/or treat various heart conditions. Doctors may recommend this procedure for a number of different reasons. The most common reason is to evaluate chest pain. Chest pain can be a symptom of coronary artery disease (CAD), and cardiac catheterization can show whether plaque is narrowing or blocking your heart's arteries. This procedure is also used to evaluate the valves, as well as measure the blood flow and oxygen levels in different parts of your heart.  For further information please visit [email or web address]  Please follow instruction sheet, as given. Prescriptions: ISOSORBIDE MONONITRATE CR 60 MG XR24H-TAB (ISOSORBIDE MONONITRATE) once daily by mouth  #30 x 11   Entered by:   Charolotte Capuchin, RN   Authorized by:   Rollene Rotunda, MD, [facility]   Signed by:   Charolotte Capuchin, RN on 08/12/2010   Method used:   Electronically to        [facility]  [phone]* (retail)       [address]       [place], [place]  [address]       Ph: [phone] or [phone]       Fax: [phone]   RxID:   [id number]  I have reviewed and approved all prescriptions at the time of this visit. Rollene Rotunda, MD, [facility]  August 12, 2010 2:51 PM

## 2010-08-22 NOTE — Letter (Signed)
Summary: Cardiac Catheterization Instructions- [facility]  [facility], Main Office  [address]   [place], [place] [address]   Phone: [phone]  Fax: [phone]     08/12/2010 MRN: [id number]  [facility] [address] [place], [place]  [address]  [place]  Dear Mr. LINCH,   You are scheduled for a Cardiac Catheterization on Thursday March 22,2012 with Dr.Jasmin Trumbull  Please arrive to the 1st floor of the Heart and Vascular Center at [facility] at 12:30pm on the day of your procedure. Please do not arrive before 6:30 a.m. Call the Heart and Vascular Center at [phone] if you are unable to make your appointmnet. The Code to get into the parking garage under the building is 3000. Take the elevators to the 1st floor. You must have someone to drive you home. Someone must be with you for the first 24 hours after you arrive home. Please wear clothes that are easy to get on and off and wear slip-on shoes. Do not eat or drink after midnight except water with your medications that morning. Bring all your medications and current insurance cards with you.   ___ Make sure you take your aspirin.  ___ You may take ALL of your medications with water that morning.   The usual length of stay after your procedure is 2 to 3 hours. This can vary.  If you have any questions, please call the office at the number listed above.   Rocco Serene, RN

## 2010-08-31 LAB — BASIC METABOLIC PANEL
BUN: 12 mg/dL (ref 6–23)
BUN: 12 mg/dL (ref 6–23)
BUN: 13 mg/dL (ref 6–23)
CO2: 26 mEq/L (ref 19–32)
CO2: 26 mEq/L (ref 19–32)
CO2: 27 mEq/L (ref 19–32)
Chloride: 106 mEq/L (ref 96–112)
Chloride: 107 mEq/L (ref 96–112)
Chloride: 108 mEq/L (ref 96–112)
Chloride: 109 mEq/L (ref 96–112)
Chloride: 114 mEq/L — ABNORMAL HIGH (ref 96–112)
Creatinine, Ser: 0.94 mg/dL (ref 0.4–1.5)
Creatinine, Ser: 1.04 mg/dL (ref 0.4–1.5)
GFR calc Af Amer: >60 mL/min (ref >60)
GFR calc Af Amer: >60 mL/min (ref >60)
GFR calc non Af Amer: >60 mL/min (ref >60)
Glucose, Bld: 106 mg/dL — ABNORMAL HIGH (ref 70–99)
Glucose, Bld: 141 mg/dL — ABNORMAL HIGH (ref 70–99)
Potassium: 4 mEq/L (ref 3.5–5.1)
Potassium: 4.3 mEq/L (ref 3.5–5.1)
Potassium: 4.3 mEq/L (ref 3.5–5.1)
Sodium: 141 mEq/L (ref 135–145)

## 2010-08-31 LAB — POCT I-STAT 3, ART BLOOD GAS (G3+)
Acid-base deficit: 1 mmol/L (ref 0.0–2.0)
Acid-base deficit: 3 mmol/L — ABNORMAL HIGH (ref 0.0–2.0)
Acid-base deficit: 3 mmol/L — ABNORMAL HIGH (ref 0.0–2.0)
Bicarbonate: 21.4 mEq/L (ref 20.0–24.0)
Bicarbonate: 22.7 mEq/L (ref 20.0–24.0)
Bicarbonate: 24 mEq/L (ref 20.0–24.0)
Bicarbonate: 24.4 mEq/L — ABNORMAL HIGH (ref 20.0–24.0)
Bicarbonate: 24.9 mEq/L — ABNORMAL HIGH (ref 20.0–24.0)
O2 Saturation: 100 %
O2 Saturation: 92 %
O2 Saturation: 99 %
Patient temperature: 35.4
Patient temperature: 37.4
TCO2: 22 mmol/L (ref 0–100)
TCO2: 24 mmol/L (ref 0–100)
TCO2: 26 mmol/L (ref 0–100)
TCO2: 26 mmol/L (ref 0–100)
TCO2: 26 mmol/L (ref 0–100)
pCO2 arterial: 31.7 mmHg — ABNORMAL LOW (ref 35.0–45.0)
pH, Arterial: 7.295 — ABNORMAL LOW (ref 7.350–7.450)
pH, Arterial: 7.338 — ABNORMAL LOW (ref 7.350–7.450)
pH, Arterial: 7.34 — ABNORMAL LOW (ref 7.350–7.450)
pH, Arterial: 7.372 (ref 7.350–7.450)
pO2, Arterial: 269 mmHg — ABNORMAL HIGH (ref 80.0–100.0)
pO2, Arterial: 75 mmHg — ABNORMAL LOW (ref 80.0–100.0)

## 2010-08-31 LAB — URINALYSIS, ROUTINE W REFLEX MICROSCOPIC
Bilirubin Urine: NEGATIVE
Glucose, UA: NEGATIVE mg/dL
Hgb urine dipstick: NEGATIVE
Ketones, ur: NEGATIVE mg/dL
Protein, ur: NEGATIVE mg/dL

## 2010-08-31 LAB — GLUCOSE, CAPILLARY
Glucose-Capillary: 107 mg/dL — ABNORMAL HIGH (ref 70–99)
Glucose-Capillary: 115 mg/dL — ABNORMAL HIGH (ref 70–99)
Glucose-Capillary: 117 mg/dL — ABNORMAL HIGH (ref 70–99)
Glucose-Capillary: 130 mg/dL — ABNORMAL HIGH (ref 70–99)
Glucose-Capillary: 145 mg/dL — ABNORMAL HIGH (ref 70–99)
Glucose-Capillary: 86 mg/dL (ref 70–99)
Glucose-Capillary: 99 mg/dL (ref 70–99)

## 2010-08-31 LAB — CBC
HCT: 29.6 % — ABNORMAL LOW (ref 39.0–52.0)
HCT: 31.7 % — ABNORMAL LOW (ref 39.0–52.0)
HCT: 33 % — ABNORMAL LOW (ref 39.0–52.0)
HCT: 33.9 % — ABNORMAL LOW (ref 39.0–52.0)
HCT: 35.2 % — ABNORMAL LOW (ref 39.0–52.0)
HCT: 37.7 % — ABNORMAL LOW (ref 39.0–52.0)
HCT: 38 % — ABNORMAL LOW (ref 39.0–52.0)
Hemoglobin: 10.3 g/dL — ABNORMAL LOW (ref 13.0–17.0)
Hemoglobin: 12.9 g/dL — ABNORMAL LOW (ref 13.0–17.0)
MCHC: 34.2 g/dL (ref 30.0–36.0)
MCHC: 34.3 g/dL (ref 30.0–36.0)
MCHC: 34.4 g/dL (ref 30.0–36.0)
MCHC: 34.8 g/dL (ref 30.0–36.0)
MCV: 88.6 fL (ref 78.0–100.0)
MCV: 89.3 fL (ref 78.0–100.0)
MCV: 89.3 fL (ref 78.0–100.0)
MCV: 89.4 fL (ref 78.0–100.0)
MCV: 89.5 fL (ref 78.0–100.0)
MCV: 89.5 fL (ref 78.0–100.0)
MCV: 89.7 fL (ref 78.0–100.0)
Platelets: 142 10*3/uL — ABNORMAL LOW (ref 150–400)
Platelets: 155 10*3/uL (ref 150–400)
Platelets: 199 10*3/uL (ref 150–400)
Platelets: 208 10*3/uL (ref 150–400)
RBC: 3.69 MIL/uL — ABNORMAL LOW (ref 4.22–5.81)
RBC: 3.8 MIL/uL — ABNORMAL LOW (ref 4.22–5.81)
RBC: 3.93 MIL/uL — ABNORMAL LOW (ref 4.22–5.81)
RBC: 4.26 MIL/uL (ref 4.22–5.81)
RDW: 14.2 % (ref 11.5–15.5)
RDW: 14.2 % (ref 11.5–15.5)
RDW: 14.5 % (ref 11.5–15.5)
WBC: 11.9 10*3/uL — ABNORMAL HIGH (ref 4.0–10.5)
WBC: 15 10*3/uL — ABNORMAL HIGH (ref 4.0–10.5)
WBC: 6.9 10*3/uL (ref 4.0–10.5)
WBC: 7.8 10*3/uL (ref 4.0–10.5)
WBC: 9 10*3/uL (ref 4.0–10.5)

## 2010-08-31 LAB — POCT I-STAT, CHEM 8
BUN: 12 mg/dL (ref 6–23)
BUN: 9 mg/dL (ref 6–23)
Calcium, Ion: 1.14 mmol/L (ref 1.12–1.32)
Chloride: 102 mEq/L (ref 96–112)
Creatinine, Ser: 0.8 mg/dL (ref 0.4–1.5)
Glucose, Bld: 136 mg/dL — ABNORMAL HIGH (ref 70–99)
Sodium: 138 mEq/L (ref 135–145)
Sodium: 140 mEq/L (ref 135–145)
TCO2: 19 mmol/L (ref 0–100)

## 2010-08-31 LAB — POCT I-STAT 4, (NA,K, GLUC, HGB,HCT)
Glucose, Bld: 111 mg/dL — ABNORMAL HIGH (ref 70–99)
Glucose, Bld: 116 mg/dL — ABNORMAL HIGH (ref 70–99)
Glucose, Bld: 116 mg/dL — ABNORMAL HIGH (ref 70–99)
Glucose, Bld: 129 mg/dL — ABNORMAL HIGH (ref 70–99)
HCT: 26 % — ABNORMAL LOW (ref 39.0–52.0)
HCT: 33 % — ABNORMAL LOW (ref 39.0–52.0)
HCT: 33 % — ABNORMAL LOW (ref 39.0–52.0)
Hemoglobin: 11.2 g/dL — ABNORMAL LOW (ref 13.0–17.0)
Hemoglobin: 11.2 g/dL — ABNORMAL LOW (ref 13.0–17.0)
Hemoglobin: 11.9 g/dL — ABNORMAL LOW (ref 13.0–17.0)
Hemoglobin: 8.8 g/dL — ABNORMAL LOW (ref 13.0–17.0)
Potassium: 3.7 mEq/L (ref 3.5–5.1)
Potassium: 4.1 mEq/L (ref 3.5–5.1)
Potassium: 4.4 mEq/L (ref 3.5–5.1)
Sodium: 136 mEq/L (ref 135–145)
Sodium: 138 mEq/L (ref 135–145)

## 2010-08-31 LAB — COMPREHENSIVE METABOLIC PANEL
ALT: 22 U/L (ref 0–53)
Alkaline Phosphatase: 79 U/L (ref 39–117)
CO2: 27 mEq/L (ref 19–32)
Chloride: 104 mEq/L (ref 96–112)
GFR calc non Af Amer: >60 mL/min (ref >60)
Glucose, Bld: 103 mg/dL — ABNORMAL HIGH (ref 70–99)
Potassium: 3.6 mEq/L (ref 3.5–5.1)
Sodium: 137 mEq/L (ref 135–145)
Total Protein: 6.5 g/dL (ref 6.0–8.3)

## 2010-08-31 LAB — PROTIME-INR
Prothrombin Time: 12.5 seconds (ref 11.6–15.2)
Prothrombin Time: 14.7 seconds (ref 11.6–15.2)

## 2010-08-31 LAB — POCT I-STAT GLUCOSE
Glucose, Bld: 115 mg/dL — ABNORMAL HIGH (ref 70–99)
Operator id: 3406

## 2010-08-31 LAB — BLOOD GAS, ARTERIAL
Acid-Base Excess: 1.3 mmol/L (ref 0.0–2.0)
Drawn by: 277331
O2 Saturation: 94.4 %
pO2, Arterial: 65.1 mmHg — ABNORMAL LOW (ref 80.0–100.0)

## 2010-08-31 LAB — LIPID PANEL
HDL: 38 mg/dL — ABNORMAL LOW (ref >39)
LDL Cholesterol: 110 mg/dL — ABNORMAL HIGH (ref 0–99)
Total CHOL/HDL Ratio: 4.6 RATIO
Triglycerides: 163 mg/dL — ABNORMAL HIGH (ref <150)
VLDL: 28 mg/dL (ref 0–40)
VLDL: 33 mg/dL (ref 0–40)

## 2010-08-31 LAB — POCT CARDIAC MARKERS: Troponin i, poc: <0.05 ng/mL (ref 0.00–0.09)

## 2010-08-31 LAB — CARDIAC PANEL(CRET KIN+CKTOT+MB+TROPI)
CK, MB: 1.3 ng/mL (ref 0.3–4.0)
CK, MB: 1.6 ng/mL (ref 0.3–4.0)
Total CK: 66 U/L (ref 7–232)
Total CK: 71 U/L (ref 7–232)
Troponin I: 0.02 ng/mL (ref 0.00–0.06)

## 2010-08-31 LAB — ABO/RH: ABO/RH(D): A NEG

## 2010-08-31 LAB — HEMOGLOBIN A1C
Hgb A1c MFr Bld: 5.8 % (ref 4.6–6.1)
Hgb A1c MFr Bld: 5.9 % (ref 4.6–6.1)

## 2010-08-31 LAB — MRSA PCR SCREENING: MRSA by PCR: NEGATIVE

## 2010-08-31 LAB — TYPE AND SCREEN
ABO/RH(D): A NEG
Antibody Screen: NEGATIVE

## 2010-08-31 LAB — CREATININE, SERUM
Creatinine, Ser: 0.77 mg/dL (ref 0.4–1.5)
GFR calc Af Amer: >60 mL/min (ref >60)

## 2010-08-31 LAB — APTT
aPTT: 29 seconds (ref 24–37)
aPTT: 35 seconds (ref 24–37)

## 2010-08-31 LAB — CK TOTAL AND CKMB (NOT AT ARMC)
Relative Index: INVALID (ref 0.0–2.5)
Total CK: 65 U/L (ref 7–232)

## 2010-08-31 LAB — MAGNESIUM
Magnesium: 2.2 mg/dL (ref 1.5–2.5)
Magnesium: 2.5 mg/dL (ref 1.5–2.5)

## 2010-08-31 LAB — PLATELET COUNT: Platelets: 145 10*3/uL — ABNORMAL LOW (ref 150–400)

## 2010-08-31 LAB — HEPARIN LEVEL (UNFRACTIONATED): Heparin Unfractionated: 0.41 IU/mL (ref 0.30–0.70)

## 2010-09-03 ENCOUNTER — Ambulatory Visit (INDEPENDENT_AMBULATORY_CARE_PROVIDER_SITE_OTHER): Payer: Medicare Other

## 2010-09-03 ENCOUNTER — Inpatient Hospital Stay (INDEPENDENT_AMBULATORY_CARE_PROVIDER_SITE_OTHER)
Admission: RE | Admit: 2010-09-03 | Discharge: 2010-09-03 | Disposition: A | Discharge status: Home / Self Care | Payer: Medicare Other | Source: Ambulatory Visit

## 2010-09-03 DIAGNOSIS — S62309A Unspecified fracture of unspecified metacarpal bone, initial encounter for closed fracture: Secondary | ICD-10-CM

## 2010-09-17 ENCOUNTER — Encounter: Payer: Self-pay | Admitting: Cardiology

## 2010-09-18 ENCOUNTER — Ambulatory Visit (INDEPENDENT_AMBULATORY_CARE_PROVIDER_SITE_OTHER): Payer: Medicare Other | Admitting: Cardiology

## 2010-09-18 ENCOUNTER — Encounter: Payer: Self-pay | Admitting: Cardiology

## 2010-09-18 VITALS — BP 140/83 | HR 76 | Ht 73.0 in | Wt 270.0 lb

## 2010-09-18 DIAGNOSIS — E785 Hyperlipidemia, unspecified: Secondary | ICD-10-CM

## 2010-09-18 DIAGNOSIS — I2510 Atherosclerotic heart disease of native coronary artery without angina pectoris: Secondary | ICD-10-CM

## 2010-09-18 DIAGNOSIS — I739 Peripheral vascular disease, unspecified: Secondary | ICD-10-CM

## 2010-09-18 NOTE — Assessment & Plan Note (Signed)
I did review previous records and he had a small abdominal aneurysm 3.3 cm last evaluated in 2010. I will follow this in the future. He has known right iliac stenosis but this is not symptomatic and no change in therapy is indicated.

## 2010-09-18 NOTE — Progress Notes (Signed)
HPI The patient presents for followup after cardiac catheterization. Results are described elsewhere. Essentially he had pain grafts with no culprit lesion to explain recent chest pain. This was a very difficult secondary to tortuous aorta.  He has had no chest pain since that time. He has had no palpitations, presyncope or syncope. He denies any PND or orthopnea. He did have some bruising after the catheterization and this is resolving.  No Known Allergies  Current Outpatient Prescriptions  Medication Sig Dispense Refill  . aspirin 81 MG tablet Take 81 mg by mouth daily.        . B Complex-C (B-COMPLEX WITH VITAMIN C) tablet Take 1 tablet by mouth daily.        . Cholecalciferol (VITAMIN D) 1000 UNITS capsule Take 1,000 Units by mouth daily.        . enalapril (VASOTEC) 20 MG tablet Take 20 mg by mouth daily.        . fish oil-omega-3 fatty acids 1000 MG capsule Take 1 g by mouth daily.        . furosemide (LASIX) 40 MG tablet 2 tabs po qd       . levothyroxine (SYNTHROID, LEVOTHROID) 200 MCG tablet Take 200 mcg by mouth daily.        . nitroGLYCERIN (NITROSTAT) 0.4 MG SL tablet Place 0.4 mg under the tongue every 5 (five) minutes as needed.        . potassium chloride SA (K-DUR,KLOR-CON) 20 MEQ tablet 1/2 tab po qd       . spironolactone (ALDACTONE) 25 MG tablet Take 25 mg by mouth daily.        Marland Kitchen tiotropium (SPIRIVA) 18 MCG inhalation capsule Place 18 mcg into inhaler and inhale daily.        Marland Kitchen DISCONTD: isosorbide mononitrate (IMDUR) 60 MG 24 hr tablet Take 60 mg by mouth daily.          Past Medical History  Diagnosis Date  . Hypothyroidism   . 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  Marland Kitchen Hernia   . Bone spur     and fused vertebrae.  He   is treated by Dr. Thomasena Edis.  Marland Kitchen CAD (coronary artery disease)   . PVD (peripheral vascular disease)     (90% left iliac stenosis)  . Aortic ectasia       no abdominal aortic aneurysm.  He is followed for  this by   Dr. Gretta Began.    Past Surgical History  Procedure Date  . Pneumonectomy   . Knee surgery   . Cataract extraction   . Pilonidal cyst / sinus excision   . Coronary artery bypass graft      Median sternotomy, extracorporeal circulation,   coronary artery bypass graft surgery x3 using a left internal mammary   artery graft to left anterior descending coronary artery, with a    saphenous vein graft to the intermediate coronary artery, and a   saphenous vein graft to the right coronary artery.  Endoscopic vein   harvesting from the left leg.     ROS: As stated in the HPI and negative for all other systems except for recent fall and diarhea  PHYSICAL EXAM BP 140/83  Pulse 76  Ht 6\' 1"  (1.854 m)  Wt 270 lb (122.471 kg)  BMI 35.62 kg/m2 GENERAL:  Well appearing NECK:  No jugular venous distention, waveform within normal limits, carotid upstroke brisk and  symmetric, no bruits, no thyromegaly LYMPHATICS:  No cervical, inguinal adenopathy LUNGS:  Clear to auscultation bilaterally BACK:  No CVA tenderness CHEST:  Well healed sternotomy scar. HEART:  PMI not displaced or sustained,S1 and S2 within normal limits, no S3, no S4, no clicks, no rubs, no murmurs ABD:  Flat, positive bowel sounds normal in frequency in pitch, no bruits, no rebound, no guarding, no midline pulsatile mass, no hepatomegaly, no splenomegaly, obese EXT:  2 plus pulses throughout, trace edema, no cyanosis no clubbing SKIN:  No rashes no nodules NEURO:  Cranial nerves II through XII grossly intact, motor grossly intact throughout PSYCH:  Cognitively intact, oriented to person place and time  ASSESSMENT AND PLAN

## 2010-09-18 NOTE — Patient Instructions (Signed)
Follow up in 6 months with Dr Hochrein Continue current medications 

## 2010-09-18 NOTE — Assessment & Plan Note (Signed)
This was followed by Dr. Oneta Rack.

## 2010-09-18 NOTE — Assessment & Plan Note (Signed)
His cath demonstrated a 95% left main stenosis, Misty Stanley the LAD was patent, SVG to OM was patent and SVG to right coronary pain. Given this is secondary risk reduction no further testing or change in therapy.

## 2010-09-20 NOTE — Cardiovascular Report (Signed)
  [name], [name]                 ACCOUNT NO.:  [id number]  MEDICAL RECORD NO.:  [id number]           PATIENT TYPE:  LOCATION:                                 FACILITY:  PHYSICIAN:  Rollene Rotunda, MD, FACCDATE OF BIRTH:  28-Jul-1924  DATE OF PROCEDURE:  08/15/2010 DATE OF DISCHARGE:                           CARDIAC CATHETERIZATION   PRIMARY CARE PHYSICIAN:  Lucky Cowboy, MD  PROCEDURE:  Left heart catheterization/coronary arteriography.  INDICATION:  Evaluated the patient with chest pain.  He had previous CABG secondary to severe left main disease.  PROCEDURE NOTE:  Left heart catheterization was performed via the right femoral artery.  The artery was cannulated using anterior wall puncture. A #4-French arterial sheath was inserted via the Seldinger technique. We did switch out during the case to a 5-French arterial long sheath. Multiple catheters were utilized.  The procedure was very prolonged lasting 75 minutes.  He had an extremely tortuous vessel making cannulation of his vessels extremely difficult.  There was extended fluoroscopy time of 40 minutes.  RESULTS:  Hemodynamics LV 131/19, AO 130/97.  CORONARIES:  Left main had distal 95% stenosis.  The LAD was patent.  It was seen only via the LIMA injection, which was a flush shot and so was incompletely opacified, but seen to be free of high-grade disease.  Ramus intermediate.  This was seen to fill via vein graft.  There was no obstructive disease noted.  It was a large vessel.  The right coronary artery was large dominant vessel.  The native vessel was not selectively cannulated.  However, the distal vessel was adequately visualized with the vein graft injection.  There was a large PDA, which was free of disease.  There was moderate-sized posterolateral, free of disease.  Grafts.  LIMA to the LAD was not selectively cannulated, but the flush injection demonstrated it to be patent down to the  anastomosis. Saphenous vein graft to the ramus intermediate was widely patent.  There were some luminal irregularities.  Saphenous vein graft to the right coronary artery had distal 25% stenosis.  Valves were seen.  Otherwise, it was widely patent.  LV.  Left ventricle was crossed for pressures, but not injected secondary to the length of the procedure, difficulty with catheter manipulation, and amount of contrast dye used.  CONCLUSION:  Severe native three-vessel coronary artery disease.  Patent grafts.  PLAN:  The patient will be managed medically.  His ongoing chest pain appears to be nonanginal.     Rollene Rotunda, MD, [facility]     JH/MEDQ  D:  08/15/2010  T:  08/16/2010  Job:  [id number]  cc:   Lucky Cowboy, M.D.  Electronically Signed by Rollene Rotunda MD [facility] on 09/20/2010 11:43:10 AM

## 2010-10-08 NOTE — Assessment & Plan Note (Signed)
OFFICE VISIT   [name], [name]  DOB:  [birth year]                                        February 06, 2009  CHART #:  [id number]   The patient returned today for follow up status post coronary bypass  graft surgery x3 on 01/12/2009.  Since discharge he said that he has  continued to improve.  He has been eating fairly well.  He denies any  chest pain or shortness of breath.  He did have some postoperative  atrial fibrillation and was discharged home on amiodarone in sinus  rhythm.  He has noticed no irregular heart rhythms or palpitations.   PHYSICAL EXAMINATION:  Vital  Signs:  Today, his blood pressure 140/70,  his pulse is 56 and regular, respiratory rate is 18 and unlabored.  Oxygen saturation on room air is 91%.  General:  He looks well.  Cardiac:  Regular rate and rhythm with normal heart sounds.  Lungs:  Clear.  Chest:  Chest incision is healing well and the sternum is  stable.  Extremities:  His leg incision is healing well.  There is mild  bilateral lower extremity edema to the knees.   MEDICATIONS:  Amiodarone 200 mg daily, aspirin 325 mg daily, Lasix 40 mg  daily, Lopressor 25 mg b.i.d., potassium 20 mEq daily, rosuvastatin 10  mg at bedtime, tramadol 50 mg p.r.n., levothyroxine 200 mcg daily, and a  multivitamin daily.   A followup chest x-ray today shows improved aeration with decreasing  small bilateral pleural effusions and bibasilar atelectasis.   IMPRESSION:  Overall, the patient is making a slow but steady recovery  following surgery.  He was significantly debilitated preoperatively.  I  encouraged him to continue walking as much as possible, although he is  somewhat limited due to his problems with his right leg.  I encouraged  him to attend cardiac rehab.  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  He will continue to follow up with  Dr. Sharyn Lull and will contact me if he develops any problems with his  incisions.   Evelene Croon, M.D.  Electronically Signed   BB/MEDQ  D:  02/06/2009  Cook:  02/07/2009  Job:  [id number]   cc:   Eduardo Osier. Sharyn Lull, M.D.

## 2010-10-08 NOTE — Discharge Summary (Signed)
[name], [name]                 ACCOUNT NO.:  [id number]   MEDICAL RECORD NO.:  [id number]          PATIENT TYPE:  INP   LOCATION:  2015                         FACILITY:  [facility]   PHYSICIAN:  Evelene Croon, M.D.     DATE OF BIRTH:  Oct 13, 1924   DATE OF ADMISSION:  01/10/2009  DATE OF DISCHARGE:                               DISCHARGE SUMMARY   FINAL DIAGNOSIS:  High-grade left main coronary artery disease.   IN-HOSPITAL DIAGNOSES:  1. Postoperative atrial fibrillation.  2. Acute blood loss anemia.  3. Volume overload postoperatively.   SECONDARY DIAGNOSES:  1. Hypertension.  2. History of paroxysmal atrial fibrillation.  3. Chronic obstructive pulmonary disease.  4. History of non-small cell lung cancer status post right thoracotomy      with right upper lobectomy.  5. Left knee osteoarthritis status post left total knee arthroplasty.  6. Hypothyroidism.  7. Gastroesophageal reflux disease.  8. Esophagogastroduodenoscopy.  9. Morbid obesity.  10.Status post cataract surgery.   IN-HOSPITAL OPERATIONS AND PROCEDURES:  1. Cardiac catheterization.  2. Coronary artery bypass grafting x3 using a left internal mammary      artery graft to left anterior descending coronary artery, saphenous      vein graft to intermediate coronary artery, saphenous vein graft to      right coronary artery.  Endoscopic vein harvesting from left leg      was done.   HISTORY AND PHYSICAL AND HOSPITAL COURSE:  The patient is an 77 year old  white male with a history of hypertension, hyperlipidemia, morbid  obesity, as well as paroxysmal atrial fibrillation and history of prior  lung cancer resection, status post right lobectomy in 2005.  He was  admitted with substernal chest pain radiating to his jaw with nausea and  diaphoresis relieved with nitroglycerin.  Troponin I was less than 0.05.  His CPK was negative.  Cardiac catheterization was done by Dr. Leron Croak  which showed an 85-90% distal left  main stenosis compromising a large  LAD and a large intermediate branch.  The left circumflex was a  nondominant vessel.  The right coronary artery was a large dominant  vessel that had some ostial and proximal disease.  EF was 50-55%.  Dr.  Laneta Simmers was consulted following catheterization.  Dr. Laneta Simmers saw and  evaluated the patient.  He discussed with the patient undergoing  coronary artery bypass grafting.  Risks and benefits were discussed with  the patient.  The patient nods understanding and agreed to proceed.  Surgery was scheduled for January 12, 2009.  Preoperatively, the patient  underwent bilateral carotid duplex ultrasound showing no ICA stenosis.  The patient also had preoperative ABIs showing on the right to be 0.84  and the left to be 0.73.  The patient remained stable preoperatively.  For further details of the patient's past medical history and physical  exam, please see dictated H&P.   The patient was taken to the operating room on January 12, 2009 where he  underwent coronary artery bypass grafting x3 using a left internal  mammary artery graft to left anterior descending coronary  artery,  saphenous vein graft to intermediate coronary artery, saphenous vein  graft to right coronary artery.  Endoscopic vein harvesting from the  left leg was done.  The patient tolerated this procedure well and  transferred to the intensive care unit in stable condition.  Postoperatively, the patient was noted to be hemodynamically stable.  He  was extubated evening of surgery.  Post extubation, the patient noted to  be alert and oriented x4.  Neuro intact.  Postoperatively, the patient  was initially noted to be in normal sinus rhythm.  Blood pressure  stable.  All drips were able to be weaned and discontinued.  The patient  was started on low-dose beta-blocker.  By postop day #4, the patient  started to go in and out of atrial fibrillation.  He does have a history  of paroxysmal atrial  fibrillation.  He was started on p.o. amiodarone at  that time, as well as continued on Lopressor.  Heart rate continued to  be monitored.  Currently, the patient is in normal sinus rhythm.  Blood  pressure remained stable.  Post extubation, the patient's pulmonary  status noted to be stable.  Chest x-ray obtained postop day #1 was  stable.  Chest tubes had minimal drainage and were deceased in routine  fashion.  The patient was encouraged to use his incentive spirometer.  He was eventually able to be weaned off oxygen sating greater than 90%  on room air.  The patient did have some mild acute blood loss anemia  postoperatively.  His hemoglobin/hematocrit were followed closely.  He  did not require any blood transfusions postoperatively.  The patient had  some mild volume overload postoperatively.  He was started on diuretics.  Daily weights were obtained.  This was slowly improving.  The patient is  currently below his baseline weight.  He is below by 2.2 kg.  Postoperatively, the patient was up ambulating well with cardiac rehab.  Physical Therapy was consulted for further assistance.  He was  progressing well.  They did recommend home health PT.  The patient was  tolerating diet well.  No nausea, vomiting noted.  All incisions were  clean, dry and intact and healing well.   On January 17, 2009, the patient's vital signs were noted to be stable.  He is afebrile.  He is in normal sinus rhythm.  His most recent lab work  shows a white blood cell count of 11.3, hemoglobin 11.8, hematocrit  33.9, platelets of 247.  Sodium of 137, potassium 4.1, chloride of 106,  bicarbonate 26, BUN of 12, creatinine 1.04, glucose of 112.  The patient  is tentatively ready for discharge home in the a.m. pending he remained  stable.   FOLLOW-UP APPOINTMENTS:  A followup appointment has been arranged with  Dr. Laneta Simmers for February 06, 2009 at 12:30 p.m.  The patient will need  to obtain PMI chest x-ray 30  minutes prior to this appointment.  The  patient will need to follow up with Dr. Sharyn Lull in 2 weeks.  He will  need to contact Dr. Annitta Jersey office to make these arrangements.   ACTIVITY:  The patient instructed no driving until released to do so, no  heavy lifting over 10 pounds.  He is told to ambulate 3-4 times a day  and progress as tolerated and to continue his breathing exercises.   INCISIONAL CARE:  The patient is told to shower washing his incisions  using soap and water.  He is to  contact the office if he develops any  drainage or opening from any of his incision sites.   DIET:  The patient educated on diet to be low-fat, low-salt.   DISCHARGE MEDICATIONS:  1. Tylenol 325 mg 2 tablets q.6 h. p.r.n.  2. Amiodarone 200 mg 2 tablets b.i.d. x14 days and 1 tablet b.i.d.  3. Aspirin 325 mg daily.  4. Lasix 40 mg daily x5 days.  5. Metoprolol 50 mg b.i.d.  6. Oxycodone 5 mg 1-2 tablets q. 4-6 h. p.r.n. pain.  7. Potassium chloride 20 mEq daily x5 days.  8. Crestor 10 mg at night.  9. Levothyroxine 200 mcg daily.  10.Multivitamin daily.      Sol Blazing, PA      Evelene Croon, M.D.  Electronically Signed    KMD/MEDQ  D:  01/17/2009  T:  01/17/2009  Job:  [id number]   cc:   Eduardo Osier. Sharyn Lull, M.D.

## 2010-10-08 NOTE — Discharge Summary (Signed)
[name], [name]                 ACCOUNT NO.:  [id number]   MEDICAL RECORD NO.:  [id number]          PATIENT TYPE:  INP   LOCATION:  2015                         FACILITY:  [facility]   PHYSICIAN:  Evelene Croon, M.D.     DATE OF BIRTH:  Feb 05, 1925   DATE OF ADMISSION:  01/10/2009  DATE OF DISCHARGE:  01/18/2009                               DISCHARGE SUMMARY   ADDENDUM:   DISCHARGE MEDICATIONS:  1. Amiodarone 200 mg p.o. two times daily.  2. Enteric-coated aspirin 325 mg p.o. daily.  3. Lasix 40 mg p.o. daily x5 days.  4. Metoprolol 25 mg p.o. two times daily.  5. Ultram 50 mg one to two tablets every 4-6 hours as needed for pain.  6. Potassium chloride 20 mEq p.o. daily x5 days.  7. Pressor 10 mg p.o. at night.  8. Levothyroxine 200 mcg p.o. daily.  9. Multivitamin p.o. daily.      Doree Fudge, [place]      Evelene Croon, M.D.  Electronically Signed    DZ/MEDQ  D:  01/18/2009  T:  01/19/2009  Job:  [id number]   cc:   Eduardo Osier. Sharyn Lull, M.D.  Evelene Croon, M.D.

## 2010-10-08 NOTE — Op Note (Signed)
[name], [name]                 ACCOUNT NO.:  [id number]   MEDICAL RECORD NO.:  [id number]          PATIENT TYPE:  INP   LOCATION:  2308                         FACILITY:  [facility]   PHYSICIAN:  Evelene Croon, M.D.     DATE OF BIRTH:  [birth year]   DATE OF PROCEDURE:  01/12/2009  DATE OF DISCHARGE:                               OPERATIVE REPORT   PREOPERATIVE DIAGNOSIS:  High-grade left main coronary artery disease.   POSTOPERATIVE DIAGNOSIS:  High-grade left main coronary artery disease.   OPERATIVE PROCEDURE:  Median sternotomy, extracorporeal circulation,  coronary artery bypass graft surgery x3 using a left internal mammary  artery graft to left anterior descending coronary artery, with a  saphenous vein graft to the intermediate coronary artery, and a  saphenous vein graft to the right coronary artery.  Endoscopic vein  harvesting from the left leg.   ATTENDING SURGEON:  Evelene Croon, MD   ASSISTANT:  Coral Ceo, [facility]   ANESTHESIA:  General endotracheal.   CLINICAL HISTORY:  This patient is an 77 year old white male with a  history of hypertension, hyperlipidemia, and morbid obesity as well as  paroxysmal atrial fibrillation and history of prior lung cancer  resection status post right lobectomy in 2005.  He was admitted with  substernal chest pain radiating to his jaw with nausea and diaphoresis,  relieved with nitroglycerin.  Troponin-I was less than 0.05.  His CPK  was negative.  Catheterization by Dr. Sharyn Lull showed 85% to 90% distal  left main stenosis compromising a large LAD and a large intermediate  branch.  The left circumflex was a nondominant vessel.  The right  coronary artery was a large dominant vessel that had some ostial and  proximal disease.  I estimated that the proximal stenosis was about 60%,  although Cardiology felt that it was less.  Left ventricular ejection  fraction was 50% to 55%.  After review of the catheterization  examination of the  patient, I felt that coronary artery bypass graft  surgery is the best treatment to prevent further ischemia and  infarction.  I discussed the operative procedure with the patient and  his family.  We discussed alternatives, benefits, and risks including,  but not limited to bleeding, blood transfusion, infection, stroke,  myocardial infarction, graft failure, and death.  He understood and  agreed to proceed.   OPERATIVE PROCEDURE:  The patient was taken to operating room and placed  on table in supine position.  After induction of general endotracheal  anesthesia, a Foley catheter was placed in bladder using sterile  technique.  Preoperative intravenous antibiotics were given.  Then the  chest, abdomen, and both lower extremities were prepped and draped in  usual sterile manner.  The chest was entered through a median sternotomy  incision.  The pericardium opened midline.  Examination of heart showed  good ventricular contractility.  The ascending aorta was fairly long and  it had no palpable plaques in it.  The heart and aorta were shift  towards the right side due to prior lobectomy.   Then, the left internal mammary  artery was harvested from the chest wall  using pedicle graft.  This is a medium caliber vessel with excellent  blood flow through it.  At the same time, a segment of greater saphenous  vein was harvested from the left leg using endoscopic vein harvest  technique.  This vein was of medium size and good quality.   Then, the patient was heparinized when an adequate activated clotting  time was achieved, the distal ascending aorta was cannulated using a 22-  French aortic cannula for arterial inflow.  Venous outflow was achieved  using a 40-French single stage venous cannula.  An antegrade  cardioplegia and vent cannula was inserted in the aortic root.   The patient was then placed on cardiopulmonary bypass and distal  coronaries were identified.  The LAD was a large  graftable vessel.  The  intermediate vessel was also a large graftable vessel.  The right  coronary was large graftable vessel that was heavily diseased in its  proximal and midportion, but had no disease distally.   Then, the aorta was cross-clamped and 1000 mL of cold blood antegrade  cardioplegia was administered in the aortic root with quick arrest the  heart.  Systemic hypothermia to 20 degrees centigrade and topical  hypothermic iced saline was used.  A temperature probe placed in septum  insulating pad in the pericardium.   The first distal anastomosis was performed to the intermediate coronary  artery.  The internal diameter of this vessel was about 2.5 mm.  The  conduit used was a segment of greater saphenous vein and the anastomosis  performed in an end-to-side manner using continuous 7-0 Prolene suture.  Flow was noted through the graft was excellent.   The second distal anastomosis was performed to the midportion of the  right coronary artery.  The internal diameter of this vessel was greater  than 2.5 mm.  The conduit used was a second segment of greater saphenous  vein and the anastomosis performed in an end-to-side manner using  continuous 7-0 Prolene suture.  Flow was noted through the graft was  excellent.  Another dose of cardioplegia given down the vein grafts and  in the aortic root.   The third distal anastomosis was performed to the midportion of the LAD.  The internal diameter of this vessel was about 2.5 mm.  The conduit used  was a left internal mammary graft and this was brought through an  opening in the left pericardium, anterior to phrenic nerve.  It was  anastomosed to the LAD in end-to-side manner using continuous 8-0  Prolene suture.  The pedicle was sutured to the epicardium with 6-0  Prolene sutures.  The patient rewarmed to 37 degrees centigrade.  With  the cross-clamp in place, the 2 proximal vein graft anastomosis was  performed.  The aortic  ascending aorta in end-to-side manner using  continuous 6-0 Prolene suture.  Then the clamp was removed from mammary  pedicle.  There was rapid warming of the ventricular septum and return  of spontaneous ventricular fibrillation.  The cross-clamp was removed  with a time of 62 minutes and the patient defibrillated into sinus  rhythm.   The proximal and distal anastomoses appeared hemostatic while the grafts  satisfactory.  Graft marker was placed around the proximal anastomoses.  Two temporary right ventricular and right atrial pacing wires placed and  brought through the skin.   When the patient rewarmed to 37 degrees centigrade, he was weaned from  cardiopulmonary  bypass on no inotropic agents.  Total bypass time was 83  minutes.  Cardiac function appeared excellent with cardiac output of 6 L  per minute.  Protamine was given and the venous and aortic cannulas  removed without difficulty.  Hemostasis was achieved.  Three chest tubes  were placed with the tube in the posterior pericardium, one in the left  pleural space and one in the anterior mediastinum.  The sternum was  closed with double #6 stainless steel wires.  The fascia was closed with  continuous #1 Vicryl suture.  Subcutaneous tissue was closed with  continuous 2-0 Vicryl and the skin with a 3-0 Vicryl subcuticular  closure.  The lower extremity vein harvest site was closed in layers in  similar manner.  The sponge, needle, and instrument counts were correct according to  scrub nurse.  Dry sterile dressing was applied over the incisions around  the chest tube with Pleur-Evac suction.  The patient remained  hemodynamically stable and was transferred to the SICU in guarded, but  stable condition.      Evelene Croon, M.D.  Electronically Signed     BB/MEDQ  D:  01/12/2009  T:  01/13/2009  Job:  [id number]   cc:   CVTS Office  Mohan N. Sharyn Lull, M.D.

## 2010-10-08 NOTE — Cardiovascular Report (Signed)
NAMEMACARTHUR, [name]                 ACCOUNT NO.:  [id number]   MEDICAL RECORD NO.:  [id number]          PATIENT TYPE:  INP   LOCATION:  2015                         FACILITY:  [facility]   PHYSICIAN:  Mohan N. Sharyn Lull, M.D. DATE OF BIRTH:  03/20/1925   DATE OF PROCEDURE:  01/11/2009  DATE OF DISCHARGE:  01/18/2009                            CARDIAC CATHETERIZATION   PROCEDURE:  Left cardiac catheterization with selective left and right  coronary angiography, LV graphy, and aortography via right groin using  Judkins technique.   INDICATIONS FOR PROCEDURE:  [name] is an 77 year old white male with  past medical history significant for hypertension, coronary artery  disease, history of paroxysmal AFib, COPD, history of CA of lung,  hypothyroidism, GERD, hypercholesteremia, morbid obesity, and  degenerative joint disease.  He came to the ER via EMS complaining of  retrosternal and precordial chest pain radiating to the jaw associated  with diaphoresis and nausea.  He took 2 sublingual nitro spray with  relief of chest pain.  Denies any palpitation, lightheadedness, or  syncope.  Denies shortness of breath.  Denies PND or orthopnea, but  complains of chronic leg swelling.  Denies fever, chills, cough.  Denies  chest pain at present.  Denies any recent cardiac workup.   PAST MEDICAL HISTORY:  As above.   PAST SURGICAL HISTORY:  He had pilonidal cyst resection many years ago,  had Baker cyst resection many years ago, had left total knee replacement  in 2007, had cataract surgery x2 in the past, and had right lobectomy in  2006.   ALLERGIES:  No known drug allergies.   MEDICATIONS AT HOME:  He was on:  1. Synthroid 200 mcg daily.  2. Lasix 40 mg p.o. daily.  3. Fenofibrate 1 tablet daily.  4. Vitamin D3, 1200 units per day.  5. Vicodin p.r.n..  6. Nitro spray p.r.n.   SOCIAL HISTORY:  He is married, has 1 child, retired.  He smoked more  than 1 pack per day for 60+ years.  No history  of alcohol or drug abuse.  He worked as a Animator in the past.   FAMILY HISTORY:  Negative for coronary artery disease.   PHYSICAL EXAMINATION:  GENERAL:  He is alert, awake, and oriented x3 in  no acute distress.  HEENT:  Conjunctivae were pink.  NECK:  Supple.  No JVD.  No bruit.  No bruit.  LUNGS:  He has decreased breath sounds at left base.  CARDIOVASCULAR:  S1 and S2 were normal.  There was a soft systolic  murmur.  ABDOMEN:  Soft.  Bowel sounds were present.  Obese and nontender.  EXTREMITIES:  There is no clubbing or cyanosis.  There was 1+ edema.   LABORATORY DATA:  EKG showed sinus bradycardia with no acute ischemic  changes.  His 2 sets of cardiac enzymes were negative.  The patient was  admitted to telemetry unit.  MI was ruled out by serial enzymes and EKG.  I discussed with the patient at length regarding various options of  treatment, i.e., noninvasive stress testing versus left cath,  possible  PTCA and stenting, its risks and benefits and consented for PCI.   PROCEDURE:  After obtaining informed consent, the patient was brought to  the cath lab and was placed on fluoroscopy table.  Right groin was  prepped and draped in usual fashion.  Xylocaine 2% was used for local  anesthesia in the right groin.  With the help of thin-wall needle, a 6-  French arterial sheath was placed.  The sheath was aspirated and  flushed.  Next, 6-French left Judkins catheter was advanced over the  wire under fluoroscopic guidance up to the ascending aorta.  Wire was  pulled out, the catheter was aspirated, and connected to the manifold.  Catheter was further advanced and engaged into left coronary ostium.  Multiple views of the left system were taken.  Next, the catheter was  disengaged and was pulled out over the wire and was replaced with 6-  [place] right Judkins catheter which was advanced over the wire under  fluoroscopic guidance up to the ascending aorta.  Wire was pulled  out,  the catheter was aspirated, and connected to the manifold.  Catheter was  further advanced and engaged into right coronary ostium.  Multiple views  of the right system were taken.  Next, the catheter was disengaged and  was pulled out over the wire and was replaced with 6-French pigtail  catheter which was advanced over the wire under fluoroscopic guidance up  to the ascending aorta.  Wire was pulled out, the catheter was  aspirated, and connected to the manifold.  Catheter was further advanced  across the aortic valve into the LV.  LV pressures were recorded.  Next,  LV graph was done in 30-degree RAO position.  Post angiographic  pressures were recorded from LV and then pullback pressures were  recorded from the aorta.  There was no gradient across the aortic valve.  Next, the pigtail catheter was pulled down up to the abdominal aorta.  Aortography was done in PA position.  Next, pigtail catheter was pulled  out over the wire.  Sheaths were aspirated and flushed.   FINDINGS:  LV showed good LV systolic function, EF of 50-55%.  Left main  has 85-90% distal stenosis.  LAD has ostial aneurysmal dilatation and  then 30-40% focal proximal stenosis.  Diagonal 1 and 2 were very small.  Ramus was moderate size, which has mild disease.  Left circumflex was  100% occluded at the ostium.  RCA has 20-30% ostial and proximal  stenosis.  PDA is small which is patent.  PLV branches were patent.  Ascending aorta had diffuse mild aneurysmal dilatation.  Thoracic and  abdominal aorta were tortuous with mild aneurysmal dilatation.  Left  common iliac has 85-90% ostial stenosis.  The patient tolerated the  procedure well.  There were no complications.  The patient was  transferred to recovery room in stable condition and will get CVTS  consult for possible CABG.      Eduardo Osier. Sharyn Lull, M.D.  Electronically Signed     MNH/MEDQ  D:  03/13/2009  T:  03/14/2009  Job:  [id number]   cc:    Catheterization Lab

## 2010-10-11 NOTE — Op Note (Signed)
   NAMEGOVIND, FUREY                           ACCOUNT NO.:  [id number]   MEDICAL RECORD NO.:  [id number]                   PATIENT TYPE:  AMB   LOCATION:  ENDO                                 FACILITY:  [facility]   PHYSICIAN:  Shan Levans, M.D. [facility]            DATE OF BIRTH:  01-Jun-1924   DATE OF PROCEDURE:  12/14/2002  DATE OF DISCHARGE:                                 OPERATIVE REPORT   PROCEDURE:  Bronchoscopy.   INDICATIONS FOR PROCEDURE:  Evaluate right upper lobe mass.   OPERATOR:  Charlcie Cradle. Delford Field, M.D. [facility]   ANESTHESIA:  1% Xylocaine local.   PREOPERATIVE MEDICATION:  Demerol 30 mg IV push, Versed 5 mg IV push.   PROCEDURE:  The Olympus video bronchoscope was introduced through the right  nares.  The upper airways that were visualized were entirely unremarkable.  The entire tracheobronchial tree was visualized and revealed no  endobronchial lesions.  Attention was then paid to the right upper lobe  orifice.  The anterior segment underwent transbronchial needle aspiration  under fluoroscopic control, the mass in the right upper lobe appeared to be  approximated by the needle.  Specimens x 2 were obtained.  Transbronchial  biopsies were obtained.  Bronchial washings were obtained.   COMPLICATIONS:  None.   IMPRESSION:  Right upper lobe mass, evaluate for malignancy.   RECOMMENDATIONS:  Follow up pathology and microbiology.                                               Shan Levans, M.D. [facility]    PW/MEDQ  D:  12/14/2002  T:  12/14/2002  Job:  [id number]

## 2010-10-11 NOTE — Assessment & Plan Note (Signed)
[facility]                               PULMONARY OFFICE NOTE   NAME:[name], [name]                        MRN:          [id number]  DATE:02/04/2006                            DOB:          1924/06/01   [name] is a 77 year old white male with a history of a right upper lobe  lung carcinoma, previous resection, a history of hypertension, COPD,  hypothyroidism, hypercholesterolemia.  The patient underwent recently a  total knee replacement on the left.  He did well with this with no  respiratory complications.   PHYSICAL EXAMINATION:  VITAL SIGNS:  On exam, temperature 97, blood pressure  130/84, pulse 84, saturation 95% on room air.  GENERAL:  In general, this is an obese white male in no distress.  CHEST:  Chest showed distant breath sounds with prolonged expiratory wheeze;  no wheeze or rhonchi noted.  CARDIAC:  Cardiac exam showed a regular rate and rhythm without S3.  Normal  S1, S2.  ABDOMEN:  Protuberant.  Bowel sounds active.  EXTREMITIES:  Extremities showed no edema or clubbing.  SKIN:  Clear.  NEUROLOGIC:  Neurologic exam was intact.  HEENT:  HEENT exam showed no jugular venous distention.  No lymphadenopathy.  Oropharynx is clear.  NECK:  Supple.   We did a medication reconciliation for this patient.  He currently is on  Percocet p.r.n., Combivent 1 spray q.i.d., Lopressor 12.5 mg b.i.d.,  Synthroid 150 mcg daily, Robaxin 500 mg every 8 hours p.r.n., iron sulfate  324 mg 3 times daily, Protonix 40 mg daily, and warfarin daily.  He  apparently is no longer on Lasix, Lyrica, Quinaretic, simvastatin,  multivitamins, aspirin, Prostate Defense, and Spiriva.  His Synthroid dose  is now 150 mcg daily.   IMPRESSION:  Impression is that of stable chronic obstruction lung disease  with change in medication profile.   PLAN:  The patient is advised to see Dr. Oneta Rack as soon as possible to see  if additional changes in medication profile  will be necessary.  In the  interim, he is to finish his Combivent  until the current prescription expires, then resume the Spiriva at 1 spray  daily.  We will see the patient back in followup in 3 months.                                   Charlcie Cradle Delford Field, MD, FCCP   PEW/MedQ  DD:  02/04/2006  DT:  02/05/2006  Job #:  [id number]   cc:   Lucky Cowboy, M.D.

## 2010-10-11 NOTE — Op Note (Signed)
[name], [name]                 ACCOUNT NO.:  [id number]   MEDICAL RECORD NO.:  [id number]          PATIENT TYPE:  INP   LOCATION:  0008                         FACILITY:  [facility]   PHYSICIAN:  Erasmo Leventhal, M.D.DATE OF BIRTH:  [birth year]   DATE OF PROCEDURE:  01/13/2006  DATE OF DISCHARGE:                                 OPERATIVE REPORT   PREOPERATIVE DIAGNOSIS:  Left knee end stage osteoarthritis.   POSTOPERATIVE DIAGNOSIS:  Left knee end stage osteoarthritis.   OPERATION PERFORMED:  Left total knee arthroplasty.   SURGEON:  Erasmo Leventhal, M.D.   ASSISTANT:  Jaquelyn Bitter. Chabon, P.A.   ANESTHESIA:  Spinal.   ESTIMATED BLOOD LOSS:  Less than 50 mL.   DRAINS:  2 medium Hemovacs.   COMPLICATIONS:  None.   TOURNIQUET TIME:  1 hour 36 minutes at 350 mmHg.   DISPOSITION:  PACU stable.   OPERATIVE IMPLANTS:  Laural Benes & [facility] Sigma rotating platform size 6  femur, size 5 tibia, 12.5 rotating platform polyethylene insert and a 38 mm  all cemented.   DESCRIPTION OF PROCEDURE:  Patient counseled in the holding area.  Correct  side was identified.  IV was started, antibiotics were given.  Taken to  operating room, placed in supine position where spinal anesthetic was  administered.  Foley catheter was placed utilizing sterile technique by the  OR circulating nurse.  All extremities well padded.  Left knee was examined.  Had a valgus knee with approximately a 5 degree flexion contracture.  It was  elevated, prepped with DuraPrep and draped in sterile fashion.  Exsanguinated with an Esmarch.  Tourniquet was inflated to 350 mmHg.  Straight midline incision was made through the skin and subcutaneous tissue.  __________  soft tissue flaps were done.  Medial parapatellar arthrotomy was  performed.  Medial soft tissue was preserved making sure we did not  destabilize on the medial side.  The knee was then flexed, patella was  retracted but not everted.  End  stage arthritis changes involving his bone  with a shallow grooved out lateral side.  Cruciate ligaments were resected,  starter hole made in the distal femur.  Canal was irrigated until the  effluent was clear.  __________ was gently placed.  Chose a 5 degree valgus  with 11 mm cut off distal femur, found that that was not quite sufficient,  went back and took another 2 mm.  The femur was found to be a size 6.  Rotation marks were made, cutting block was applied and cut size #6. Medial  and lateral menisci were removed under direct visualization.  Geniculate  vessels were coagulated, posterior neurovascular structures were followed  off and protected throughout the entire case.  Tibial eminence was resected.  Proximal tibia was found to be a size 5.  Central aspect was made, step  reamer was utilized, canal irrigated, effluent was clear.  __________  gently placed.  We chose initially a 10 mm cut based upon the medial side of  a 0 degree slope.  Posteromedial posterolateral femoral osteophytes removed  under direct visualization.  At this time the knee was not balanced.  Was  tight in flexion and extension.  Another 4 mm was taken from proximal tibia.  At this time flexion extension blocks well balanced.  The tibial base plate  was applied.  Rotation was set.  Reamer and punch was then applied.  The  femur box cut was created standard fashion.  At this time sized 6 femur,  size5 tibia, with 10 insert with good range of motion and soft tissue  balance.  Patella was found to be a size 38.  __________ bone was cut.  Blocking holes were made.  At this point the knee was copiously irrigated  with pulsatile lavage.  Cement was mixed  utilizing __________ cement  technique.  All components were cemented into place.  Size 5 tibia, size 6  femur, trials 12.5 tibial insert and 38 patella.  After all cement had  cured, excess cement was removed.  At this time we checked the knee.  We had  excellent  alignment, flexion and extension gaps well balanced.  Also stable  to varus and valgus stress from full extension to 90 degrees of flexion.  Trial was removed.  Excess cement was removed.  Again the knee was irrigated  and a final 12.5 mm rotating platform tibial insert was implanted.  Knee was  again checked, alignment excellent, balance excellent with excellent  stability.  Patellofemoral tracking was also anatomic.  2 medium Hemovac  drains were placed.  Sequential closure was done.  Arthrotomy was closed at  90 degrees with Vicryl, subcu was closed with Vicryl.  Skin was closed with  staples.  Sterile dressing was applied to the knee.  Tourniquet was  deflated, normal circulation to foot and ankle end of case.  Drains were  hooked to suction.  Patient then awakened and taken from operating room to  PACU in stable condition.  The patient tolerated the procedure well.  There  were no complications  Sponge, needle counts were correct.  Patient doing  well recovery time this dictation.  To decrease surgical time __________           ______________________________  Erasmo Leventhal, M.D.     RAC/MEDQ  D:  01/13/2006  T:  01/14/2006  Job:  [id number]

## 2010-10-11 NOTE — Op Note (Signed)
[name], [name]                           ACCOUNT NO.:  [id number]   MEDICAL RECORD NO.:  [id number]                   PATIENT TYPE:  INP   LOCATION:  3306                                 FACILITY:  [facility]   PHYSICIAN:  Ines Bloomer, M.D.              DATE OF BIRTH:  1924-07-19   DATE OF PROCEDURE:  01/18/2003  DATE OF DISCHARGE:                                 OPERATIVE REPORT   PREOPERATIVE DIAGNOSES:  1. Right upper lobe mass.  2. Chronic obstructive pulmonary disease.  3. Chronic emphysema.   POSTOPERATIVE DIAGNOSIS:  Non-small cell lung cancer, right upper lobe.   OPERATION:  1. Right video-assisted thoracoscopic surgery.  2. Right thoracotomy.  3. Right upper lobectomy with node dissection.   SURGEON:  Ines Bloomer, M.D.   FIRST ASSISTANT:  Rowe Clack, P.A.-C.   After percutaneous insertion of all monitoring lines, the patient underwent  general anesthesia.  He was turned to the right lateral thoracotomy position  and was prepped and draped in the usual sterile manner.  Two trocar sites  were made in the anterior and posterior axillary lines at the seventh  intercostal space.  Two trocars were inserted.  The patient had pleural  plaques that were evident as well as chronic scarring and emphysematous  blebs of the right upper lobe particularly but also some at the right middle  lobe and the right lower lobe.  The mass was seen in the anterior segment of  the right upper lobe.  A posterior lateral thoracotomy incision was then  performed with the latissimus being partially divided and the serratus  reflected anteriorly.  The fifth intercostal space was inspected.  Two  Tuffiers were placed at right angles.  Dissection was started in the  fissure, dissecting down to the pulmonary artery and then dissecting up  superiorly, superior to the superior branch of the pulmonary artery to the  right lower lobe.  Then the posterior mediastinum was dissected out,  dissecting up the right upper lobe takeoff and dissecting out several 11R  nodes.  Then the superior portion of the major fissure was divided with  three applications of the EZ-45 stapler.  This exposed the posterior branch  of the right upper lobe, which was doubly ligated with 0 silk, clipped, and  divided.  A 10R and 11R node were dissected free.  Then the trachea was  dissected free, dissecting up two more 10R nodes.  Attention was turned  anteriorly and several more large 10R nodes were dissected free, dissecting  up the apical posterior branch to the right upper lobe of the pulmonary  artery.  This was looped with a vascular tape, stapled, and divided with the  Autosuture stapler.  Then attention was turned to the superior pulmonary  vein branches to the right upper lobe.  The apical posterior branch was  dissected out, stapled, and divided with  the Autosuture stapler, and then  the rest of the branches were dissected out, stapled, and divided with the  Autosuture stapler.  No anterior branch of the right upper lobe was seen.  The bronchus was looped with a vascular tape and then stapled with the TL-30  stapler and divided distally.  The minor fissure was divided with the TLC-75  and an EZ-45 stapler.  The right upper lobe was removed.  We checked for air  leaks and none was seen.  The inferior pulmonary ligament was taken down.  Two chest tubes were brought through the trocar sites and tied in place with  0 silk.  The chest was closed with three  paracostals.  A Marcaine block was done in the usual fashion.  On-Q  catheters were placed in the usual fashion, and the chest was closed with #1  Vicryl in the muscle layer, 2-0 Vicryl in the subcutaneous tissue, and 3-0  Vicryl as a subcuticular stitch.  The patient returned to the recovery room  in stable condition.                                               Ines Bloomer, M.D.    DPB/MEDQ  D:  01/18/2003  T:  01/19/2003  Job:   [id number]   cc:   Shan Levans, M.D. [facility]

## 2010-10-11 NOTE — Discharge Summary (Signed)
[name], [name]                 ACCOUNT NO.:  [id number]   MEDICAL RECORD NO.:  [id number]          PATIENT TYPE:  INP   LOCATION:  1421                         FACILITY:  [facility]   PHYSICIAN:  Erasmo Leventhal, M.D.DATE OF BIRTH:  [birth year]   DATE OF ADMISSION:  01/13/2006  DATE OF DISCHARGE:  01/19/2006                                 DISCHARGE SUMMARY   ADMISSION DIAGNOSES:  1. End-stage osteoarthritis left knee.  2. Chronic obstructive pulmonary disease.  3. Emphysema.  4. Hypertension.  5. Gastroesophageal reflux disease.  6. Hyperlipidemia.   DISCHARGE DIAGNOSES:  1. End-stage osteoarthritis left knee.  2. Chronic obstructive pulmonary disease.  3. Emphysema.  4. Hypertension.  5. Gastroesophageal reflux disease.  6. Hyperlipidemia.  7. Paroxysmal atrial fibrillation.   OPERATION/PROCEDURE:  Total knee arthroplasty, left knee.   BRIEF HISTORY:  This is a 77 year old gentleman with a history of end-stage  osteoarthritis of his left knee who has failed all conservative measures for  alleviation of his pain and discomfort.  After medical consultation with his  pulmonologist and medical doctors and medical clearance, the patient is now  scheduled for total knee arthroplasty of the left knee.  Risks, benefits and  after-care were discussed in detail with the patient.  Questions were  invited and answered and surgery to go ahead as scheduled.   LABORATORY DATA:  Admission CMET showed a sodium low at 131, glucose high at  123, total bilirubin high at 1.5 with low protein and albumin at 5.1 and 2.7  and low calcium at 7.6.  Blood lipase low at 15.  TSH 5.049.  Admission CBC  showed hemoglobin low at 10.4, hematocrit low at 30.9.  First postoperative  day BMET showed glucose 120, calcium 7.9.  Cardiac panel:  Initial cardiac  panel on August 24 showed creatinine kinase high at 421.  Troponin high at  0.1 and CK-MB high at 5.6.  This was after an episode of atrial  fibrillation.  Second cardiac panel showed a troponin high at 0.13.  CK high  at 426, CK-MB high at 5.5.  A third cardiac panel on August 25 showed CK  down at 338, troponin at 0.12, and CK-MB normal at 3.7.  PT/INR is at 2 at  discharge.  His BMET thereafter remains normal with the exception of  slightly elevated glucose.  CBC at discharge revealed hemoglobin 10.5,  hematocrit 29.8 with a slightly high RDW at 14.5, white count normal.  EKG  did show an episode of atrial fibrillation on August 24 that corrected  spontaneously to normal sinus rhythm.   HOSPITAL COURSE:  The patient tolerated the operative procedure well.  Consultation was made with the pulmonologist and with internal medicine for  management of his multiple medical problems.  On the first postoperative  day, he was feeling good. His vital signs were stable.  He was in moderate  pain.  His temperature was to a max of 101.1.  I&Os were good.  Lungs were  clear.  Heart sounds normal.  Bowel sounds active.  Drain was removed  without difficulty.  Calves were negative.  He was mildly hypernatremic. His  IV fluid was changed to normal saline at 1200 mL.  Free fluid restriction  was undertaken.   Second postoperative day, vital signs were stable though he was noted to  still have hyponatremia and hypocalcemia.  He was afebrile. Hemoglobin and  hematocrit were stable at 10.7 and 30.5.  Again he was hyponatremic at 131  and hypocalcemic at 7.6.  Amylase was normal.  Lipase was slightly  decreased.  Lungs were clear.  Heart sounds were normal. Bowel sounds  active.  Dressings changed.  Wound was benign.  I&Os not therapeutic so  Lovenox 40 subcu started.   Third postoperative day, he had a mild tachycardia at 114 and was noted to  have an irregular heartbeat. His vital signs were stable. He was afebrile.  Hemoglobin and hematocrit were stable at 10.4 and 30.9.  His BMET was within  normal limits with the exception of the glucose  at 120 and the calcium at  7.9.  Hyponatremia had corrected.  INR 1.5.  EKG was obtained. He was noted  to be in atrial fibrillation.  He was having no chest pain, no shortness of  breath.  His calves were negative.  His wound was benign.  Valsalvas were  active.  Medical consultation was called and notified of the findings and  subsequently cardiac enzymes were obtained with previously noted results.  Also chest CT scan angiogram to rule out a PE was obtained and it was  negative.  Also Dopplers were performed and were negative.    Fourth postoperative day, the patient was comfortable. Vital signs were  stable.  He was afebrile.  He was out of atrial fibrillation.  His knee  wound was benign.  His calves were negative. His hemoglobin and hematocrit  were stable at 10.4 and 29.9.  INR 1.6.  Echo was obtained and showed no  wall motion abnormalities.  Orthopedically he was stable.  Physical therapy  was resumed.  On January 17, 2006, the patient was noted to be medically  stable.  Medical service signed off to consult p.r.n.  His vital signs were  stable.  He was noted to have no fever.  His wound was benign.  His calves  were negative and plans were made for discharge to a skilled nursing  facility when available and on January 19, 2006 with vital signs stable,  temperature to a max of 99.5, oxygen saturation 91 on 2 L with good output,  hemoglobin and hematocrit stable at 10.5 and 29.8 with INR therapeutic at 2,  minimally decreased breath sounds at the base, bowel sounds active, heart  sounds with an occasional extra beat, but not irregular, calves negative,  and wound benign, plans were made to discharge the patient to [facility]  for followup as an outpatient.  I discussed the patient with Dr. Delford Field, the  patient's pulmonologist, prior to discharge and he said just to make sure that he went home on his usual medications per pulmonary status the day he  was found here at the  hospital.   DISCHARGE MEDICATIONS:  1. Combivent one puff q.i.d.  2. Ferrous sulfate 325 mg one p.o. t.i.d. for two weeks.  3. Levothyroxine 150 mcg one p.o. daily.  4. Lopressor 12.5 mg p.o. b.i.d.  5. Protonix 40 mg p.o. daily.  6. Robaxin 500 mg one p.o. q.8h. p.r.n. spasm.  7. Percocet one to two q.6h. p.r.n. pain.  8. Coumadin per pharmacy  protocol to maintain INR between 2 and 3 for a      total of three weeks postoperative.  9. Aspirin 81 mg one p.o. daily when Coumadin is finished.   CONDITION ON DISCHARGE:  Improved.   DISCHARGE INSTRUCTIONS:  He is to have physical therapy daily.  He will need  range of motion to his knee and ambulation as well as exercise.  His  weightbearing status is weightbearing as tolerated.  He will also need a CPM  machine at 0 to 40 degrees to use six hours a day and increase by 5 degrees  flexion per day until 0 to 90 for three days in a row and then maybe  discontinue.   FOLLOW UP:  In the office with Dr. Thomasena Edis in two weeks. Please call the  office today for an appointment in two weeks.  Office number is [phone].  Appointment with Dr. Delford Field in 10 days for followup of his pulmonary status.      Jaquelyn Bitter. Chabon, P.A.    ______________________________  Erasmo Leventhal, M.D.    SJC/MEDQ  D:  01/19/2006  T:  01/19/2006  Job:  [id number]

## 2010-10-11 NOTE — Discharge Summary (Signed)
NAMEJAYCEE, [name]                           ACCOUNT NO.:  [id number]   MEDICAL RECORD NO.:  [id number]                   PATIENT TYPE:  INP   LOCATION:  3306                                 FACILITY:  [facility]   PHYSICIAN:  Ines Bloomer, M.D.              DATE OF BIRTH:  29-Oct-1924   DATE OF ADMISSION:  01/18/2003  DATE OF DISCHARGE:                                 DISCHARGE SUMMARY   ANTICIPATED DATE OF DISCHARGE:  January 24, 2003   HISTORY OF THE PRESENT ILLNESS:  This is a 77 year old male referred by Drs.  [place] and [facility] for evaluation of right upper lobe lung mass.  In June  2004 routine chest x-ray revealed this right lung mass.  Follow up CT scan  of the chest revealed a spiculated mass of the right upper lobe, and no  mediastinal or hilar was noted.  On June 23rd he underwent bronchoscopy with  biopsy by Dr. Delford Field.  No endobronchial lesions were noted and there was no  malignant cells identified.  On August 04th he was seen in the CVTS Office  by Dr. Charlett Lango.  After examining me [name] and his records Dr.  Dorris Fetch recommended surgical intervention for definitive diagnosis.  Mr.  Cook wished to think over his options.  CT scan of the chest was repeated on  August 04th and noted increased size in rhe right upper love lung mass.  He  returned to the CVTS Office on August 09th to discuss the findings.  He  agreed at that time to proceed with surgery.  Dr. Dorris Fetch requested  cardiac clearance.  He was seen and evaluated by Dr. Gerri Spore.  As Dr.  Dayton Scrape was at the [facility] office during the period of surgery  arrangements were subsequently made for Dr. Edwyna Shell to proceed with his  surgery.  He is admitted this hospitalization for the procedure.   PAST MEDICAL HISTORY:  1. Hypothyroidism.  2. 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  3. Hiatal hernia with reflux.  4. Longstanding back  trouble with bone spurs and fused vertebrae treated by     Dr. Thomasena Edis.  5. Left knee pain status post several surgeries.  6. Aortic ectasia; no abdominal aortic aneurysm; he is followed by Dr. Tawanna Cooler     Early.   ALLERGIES:  No known drug allergies.   MEDICATIONS:  Medications prior to admission:  1. Synthroid 50 mcg daily.  2. Multivitamin supplement.  3. Sal-Palmetto 2-4 tablets daily.   SOCIAL HISTORY:  Please see the history and physical done at the time of  admission.   FAMILY HISTORY:  Please see the history and physical done at the time of  admission.   REVIEW OF SYSTEMS:  Please see the history and physical done at the time of  admission.   PHYSICAL EXAMINATION:  Please see the history and physical done at the time  of admission.   HOSPITAL COURSE:  The patient was admitted electively on January 18, 2003.  He underwent the following procedures:  Right upper lobe lobectomy with lymph node dissection via right thoracotomy.  The patient tolerated the procedure well and was taken to the post  anesthesia care unit  in stable condition.   Postoperative Hospital Course:  The patient has dome well.  He has required  aggressive pulmonary toilet, but he has responded well.  He has,  additionally, required multiple nebulizers including aerosolized steroid.  He has showed a good and gradual improvement in regards to oxygen  requirements and having the ability to increase his activities each day.  All routine lines, monitors, drains, and devices were discontinued in a  stepwise manner.  His incision is healing quite well without sign of  infection.  He is tolerating routine activity commensurate for level of  postoperative convalescence.  From a pathology viewpoint the patient's right  upper lobe lesion showed invasive poorly differentiated non-small cell  carcinoma consistent with poorly differentiated adenocarcinoma.  The tumor  extended close to, but does not invade the visceral  pleura.  Adjacent lung  parenchyma showed organizing pneumonia.  There was one peribronchial lymph  node that was negative for tumor.  The bronchial margin was free of tumor.  The patient had multiple 10R and 11R lymph node biopsies.  All were negative  for metastatic carcinoma.  The patient had a bone scan and CT scan of the  brain.  Both the results of these tests are currently pending to the chart.   Overall, the patient is felt to be tentatively stable for discharge in the  morning of January 24, 2003 pending morning rounds reevaluation.   DISCHARGE MEDICATIONS:  Medications on discharge include:  1. The patient's preop medications; additionally,  2. Advair 500/50 1 dose inhaled twice daily.  3. Pain medication will include Tylox 1 or 2 every six hours as needed.   DISCHARGE INSTRUCTIONS:  The patient will receive written instructions with  regard to medications, activities, diet, wound care, and follow up.   FOLLOW UP:  Follow up will include Dr. Edwyna Shell Wednesday, September 08th, at  3:30 with a chest x-ray from [facility].   DISCHARGE DIAGNOSIS:  Right upper lobe carcinoma; pathology is consistent  with poorly differentiated adenocarcinoma.   OTHER DIAGNOSES:  1. Hypothyroidism.  2. History of skin cancer.  3. History of hiatal hernia,  4.     History of longstanding back pain.  5. History of left knee pain.  6. History of aortic ectasia.         Rowe Clack, P.A.-C.                    Ines Bloomer, M.D.    Sherryll Burger  D:  01/23/2003  T:  01/24/2003  Job:  [id number]   cc:   CVTS Office   Lucky Cowboy, M.D.  [address], [address]  [place], [place] [address]  Fax: [phone]   Shan Levans, M.D. [facility]

## 2010-10-11 NOTE — Consult Note (Signed)
[name], [name]                 ACCOUNT NO.:  [id number]   MEDICAL RECORD NO.:  [id number]          PATIENT TYPE:  INP   LOCATION:  0008                         FACILITY:  [facility]   PHYSICIAN:  Thomasenia Bottoms, MDDATE OF BIRTH:  Jul 07, 1924   DATE OF CONSULTATION:  01/13/2006  DATE OF DISCHARGE:                                   CONSULTATION   REASON FOR CONSULTATION:  Medical management postoperatively.   ASSESSMENT:  This is a 77 year old gentleman immediately post left total  knee replacement.   MEDICAL ASSESSMENT:  1. Hyperlipidemia.  2. Hypothyroidism.  3. Gastroesophageal reflux disease (GERD).  4. Known chronic obstructive pulmonary disease (COPD), he has mild      obstruction with an FEV1 of 77% of predicted.  5. History of adenocarcinoma, lung cancer, post partial pneumonectomy.  6. The patient is status post negative nuclear stress test on 10/22/2005      as part of his preop evaluation.  7. The patient also has a history of gallstones.  8. History of skin cancer.  9. Osteoarthritis.   RECOMMENDATIONS:  1. Will resume his outpatient medications.  He was only taking Thyroxine      and Lasix and those prescriptions were twice daily.  The patient was      taking it only once per day, so we will continue these two medications      starting tomorrow.  2. DVT prophylaxis per the orthopedic service.  3. Pulmonary postoperative care will be provided by the pulmonology group.  4. Would recommend following his hemoglobin and hematocrit      postoperatively.  They were normal in his preop labs, hemoglobin 13.5,      hematocrit 39.6.   MEDICATIONS PRIOR TO HOSPITALIZATION:  1. Synthroid 200 mcg p.o. daily.  2. Lasix 40 mg p.o. every morning.  He had stopped his cholesterol medication and his Quinaretic.  Also, he says  he ran out of Spiriva this morning.   SOCIAL HISTORY:  The patient quit smoking in 2004.   FAMILY HISTORY:  Negative for coronary artery disease.   PAST SURGICAL HISTORY:  Significant for tonsillectomy in addition to his  pneumonectomy, he also had cataract surgery and pilonidal cyst removal.   PHYSICAL EXAMINATION:  VITAL SIGNS:  Postoperatively, his blood pressure is  139/67, pulse 67, respiratory rate 14, and he is sating 96% on 4 liters via  nasal cannula.  GENERAL:  The patient is awake, oriented, and making jokes.  HEENT:  Normocephalic, atraumatic.  Pupils are equal and round.  His sclerae  are anicteric.  Oral mucosa moist.  NECK:  Supple.  No lymphadenopathy, no thyromegaly.  CARDIAC:  Regular rate and rhythm with no murmurs, rubs or gallops  appreciated.  LUNGS:  Clear to auscultation, very good breath sounds bilaterally.  No  wheezes, rhonchi, or rales.  ABDOMEN:  Soft, nontender, not distended.  Normal active bowel sounds.  No  masses are appreciated.  EXTREMITIES:  Left leg is in a large soft immobilizer and his ankle is  bandaged.  Both lower extremities reveal palpable DP pulses.  The right  lower extremity is also in several wrappings.  NEUROLOGIC:  He is alert and oriented and attentive.  No slurred speech.  He  moves his upper extremities to command.  I did not ask him to move his lower  extremities postoperatively.  He is attentive and cooperative.   DATA:  There is no data at this time.   REVIEW OF SYSTEMS:  CONSTITUTIONAL:  Appetite excellent.  PULMONARY:  He  does have shortness of breath secondary to his COPD.  CARDIOVASCULAR:  He  has shortness of breath with exertion but no chest pains.  He does have a  history of aortic aneurysm.  GI:  Does have heartburn and acid reflux.  History of hiatal hernia.  GU:  No hematuria reported.  MUSCULOSKELETAL:  He  does have joint pains clearly which precipitated the surgery.  NEUROLOGIC:  Denies any seizures or history of strokes or migraine headaches.  HEMATOLOGIC:  The patient has had cancer in the past but no trouble with  abnormal bleeding or bruising.  This comes  from the patient's checked review  of systems.   ASSESSMENT AND RECOMMENDATIONS:  As mentioned earlier, we will follow the  patient while he is in the hospital.  Thank you very much for this consultation.      Thomasenia Bottoms, MD  Electronically Signed     CVC/MEDQ  D:  01/13/2006  T:  01/14/2006  Job:  [id number]   cc:   Jeoffrey Massed, MD  Fax: [phone]

## 2010-10-11 NOTE — H&P (Signed)
[name], [name]                           ACCOUNT NO.:  [id number]   MEDICAL RECORD NO.:  [id number]                   PATIENT TYPE:  INP   LOCATION:  NA                                   FACILITY:  [facility]   PHYSICIAN:  Ines Bloomer, M.D.              DATE OF BIRTH:  1925/01/12   DATE OF ADMISSION:  01/18/2003  DATE OF DISCHARGE:                                HISTORY & PHYSICAL   PRIMARY CARE PHYSICIAN:  Lucky Cowboy, M.D.   PULMONOLOGIST:  Shan Levans, M.D.   CHIEF COMPLAINT:  Right upper lobe lung lesion.   HISTORY OF PRESENT ILLNESS:  A 77 year old Caucasian man, referred from Dr.  Oneta Rack and Dr. Delford Field for evaluation of right upper lobe lung mass.  In  June 2004, routine chest x-ray revealed a right lung mass.  Followup CT scan  of the chest revealed a spiculated mass in the right upper lobe.  No  mediastinal or hilar adenopathy was noted.  On June 23, he underwent  bronchoscopy and biopsy by Dr. Delford Field.  No endobronchial lesions were noted,  and no malignant cells were identified.  On August 4, he was seen in the  CVTS office by Dr. Charlett Lango.  After examining [name] and  reviewing records, Dr. Dorris Fetch recommended surgical intervention for  definitive diagnosis.  [name] wished to think over his options.  CT scan of  the chest was repeated on August 4, and noted increased size of his right  upper lobe lung lesions.  He returned to the CVTS office on August 9 for  discussion of these findings.  Mr. Leavell agreed at that time to proceed with  surgery.  Dr. Dorris Fetch requested cardiac clearance.  [name] has been  cleared by Dr. Gerri Spore.  As Dr. Dorris Fetch will be at the [facility]  office over the next four weeks, arrangements have been made for Dr. Edwyna Shell  to proceed with surgery.  [name] is here today for preoperative History and  Physical Examination in anticipation of admission for surgery tomorrow for  right VATS, possible lobectomy, and  node dissection.   PAST MEDICAL HISTORY:  1. Hypothyroidism.  2. 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  3. Hiatal hernia with reflux.  4. Longstanding back trouble including bone spurs and fused vertebrae.  He     is treated by Dr. Thomasena Edis.  5. Left knee pain status post several knee surgeries also by Dr. Thomasena Edis.  6. Aortic ectasia, no abdominal aortic aneurysm.  He is followed for this by     Dr. Gretta Began.   He specifically denies any history of coronary disease, hypertension,  dyslipidemia, diabetes, or kidney disease.   ALLERGIES:  No known drug allergies.   MEDICATIONS PRIOR TO ADMISSION:  1. Synthroid 50 mcg daily.  2. Multivitamins supplement.  3.  Saw Palmetto 2 to 4 tablets daily.   SOCIAL HISTORY:  [name] has been married for the last 40 years.  He has one  daughter.  He is a retired Chief Technology Officer.  He quit smoking  cigarettes July 2004.  He has continued to smoke one cigar a day, says his  last cigar is at home.  He will smoke it tonight.  Previous to this, he did  smoke a pack a day for 60 years.  Did not drink alcohol.  He lives with his  wife in a multilevel dwelling.  He drives.  He will have assistance at home  after surgery from his wife, [name], and also his daughter, [name] and  [name].   FAMILY HISTORY:  Noncontributory.   REVIEW OF SYSTEMS:  GENERAL:  [name] says that he feels very tired and worn  out.  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  Also, a right cataract is forming.  No change  in hearing.  No difficulty chewing or swallowing.  He does have upper and  lower dentures.  PULMONARY:  No dyspnea on exertion, no cough.  CARDIAC:  No  chest pain.  He does report some pedal edema, left greater than right.  GI:  No change in bowel habits.  His appetite is good.  He follows a regular  diet.  GU:  No complaints.  He experiences  nocturia only if he does not take  his [employer].  NEUROLOGIC: He has occasional dizziness, no fainting and  no recent falls.  He ambulates independently, does use a cane.  No  claudication.  He does report some calf cramping at night.  SKIN:  He has  recurrent groin rash, jungle rot.  Treats with over-the-counter  treatments.  No recent fevers, no troubles with nose bleed, and no history  of blood clots.  He does report some anxiety related to the past two months'  workup.   PHYSICAL EXAMINATION:  VITAL SIGNS:  Blood pressure 138/80, heart rate 64.  He is 63 inches tall, weighs 225 pounds.  GENERAL:  A 77 year old Caucasian man in no acute distress.  HEENT:  Eyes are PERRLA and EOMI.  Oral mucosa is pink and moist.  NECK: Without carotid bruit.  No thyromegaly, lymphadenopathy noted.  BACK:  Kyphoscoliosis.  LUNGS:  Breathing is unlabored.  His breath sounds are clear although  distant.  HEART:  Regular rate and rhythm.  He does have a 1/6 systolic murmur.  ABDOMEN:  Rotund.  He does have bowel sounds.  Soft and nontender.  EXTREMITIES:  Lower extremity does have some mild left ankle edema.  No  varicosities or venostasis changes.  Bilateral feet are well perfused.  NEUROLOGIC:  Alert and oriented.  Cranial nerves II-XII grossly intact.  Gait is steady with his cane. He has full range of motion of all  extremities.  Muscle strength is 5/5 in upper extremities, 3 to 4/5 in lower  extremities.   LABORATORY DATA:  Pending and includes CBC, BMET, PT, and INR.   Other diagnostics include PFTs performed  previously revealed FEV1 2.7 (95%  of normal), FVC 4.8 (107% of normal).   CT scan of the chest on June 17 and August 4:  Please see enclosed reports  for complete details.   IMPRESSION:  This is a 77 year old man with a right upper lobe lung mass.   PLAN:  Elective  admission to [facility] and Dr. Edwyna Shell on August 25 for a right VATS, possible lobectomy, and node  dissection.      Toribio Harbour, N.P.                  Ines Bloomer, M.D.    CTK/MEDQ  D:  01/17/2003  T:  01/17/2003  Job:  [id number]

## 2010-10-11 NOTE — Discharge Summary (Signed)
[name], [name]                 ACCOUNT NO.:  [id number]   MEDICAL RECORD NO.:  [id number]          PATIENT TYPE:  INP   LOCATION:  1421                         FACILITY:  [facility]   PHYSICIAN:  Erasmo Leventhal, M.D.DATE OF BIRTH:  1925/04/17   DATE OF ADMISSION:  01/13/2006  DATE OF DISCHARGE:  01/19/2006                                 DISCHARGE SUMMARY   ADDENDUM:  This is an addendum and change to [name] priority discharge  summary on his discharge medications.   Discontinue the Combivent.  He will not be taking Combivent and instead he  will be taking Spiriva 1 pill q.a.m.      Jaquelyn Bitter. Chabon, P.A.    ______________________________  Erasmo Leventhal, M.D.    SJC/MEDQ  D:  01/19/2006  T:  01/19/2006  Job:  [id number]

## 2010-10-11 NOTE — H&P (Signed)
NAMEUNKNOWN, SCHLEYER                 ACCOUNT NO.:  [id number]   MEDICAL RECORD NO.:  [id number]          PATIENT TYPE:   LOCATION:                                 FACILITY:   PHYSICIAN:  Erasmo Leventhal, M.D.DATE OF BIRTH:  11/20/1924   DATE OF ADMISSION:  12/24/2005  DATE OF DISCHARGE:                                HISTORY & PHYSICAL   PULMONOLOGIST:  Dr. Shan Levans   MEDICAL DOCTOR:  Dr. Lucky Cowboy   ADMITTING DIAGNOSIS:  Left knee osteoarthritis.   BRIEF HISTORY:  This is a 77 year old gentleman with a history of end-stage  osteoarthritis of his left knee that has failed all conservative measures  for relief of pain and discomfort.  Due to his continued pain and discomfort  and inability to ambulate without severe pain he is now scheduled for total  knee arthroplasty of the left knee.  He has seen Dr. Shan Levans, his  pulmonologist, and has had medical clearance for surgery.  He has also seen  Warden/ranger, and had medical clearance with a normal stress test.  Therefore, his surgery will go ahead as scheduled.  He is scheduled for  total knee arthroplasty.  The surgery, risks, benefits, and after care were  discussed in detail with the patient as well as the increased risk of  pulmonary complications due to his COPD.  He understands and accepts these  risks and surgery will go ahead as scheduled.   PAST MEDICAL HISTORY:   ALLERGIES:  None.   CURRENT MEDICATIONS:  1.  Spiriva once a day.  2.  Levothyroxine 200 mcg one daily.  3.  Lasix 40 mg one b.i.d.   SERIOUS MEDICAL ILLNESSES:  1.  Emphysema.  2.  Hypertension.  3.  Gastroesophageal reflux disease.  4.  COPD.  5.  Hyperlipidemia.   PREVIOUS SURGERIES:  1.  Right partial pneumonectomy.  2.  Knee arthroscopies.  3.  Cataracts bilaterally.  4.  Pilonidal cysts.   FAMILY HISTORY:  Negative.   SOCIAL HISTORY:  Patient is married.  He is retired.  He does not smoke, he  quit three  years ago.  He does not drink, he quit three years ago as well.   REVIEW OF SYSTEMS:  CENTRAL NERVOUS SYSTEM:  Negative for headache, blurred  vision, or dizziness.  PULMONARY:  Positive for COPD with shortness of  breath and two pillow orthopnea.  CARDIOVASCULAR:  Negative for chest pain.  Positive for history of aortic aneurysm being treated conservatively.  GI:  Positive for GERD and hiatal hernia.  GU:  Negative for urinary tract  difficulties.  MUSCULOSKELETAL:  Positive as in HPI.  Note, also patient  uses Vick's nasal spray for nasal congestion secondary to allergies.   PHYSICAL EXAMINATION:  VITAL SIGNS:  Blood pressure 130/78, respirations 20,  pulse 76 and regular.  GENERAL APPEARANCE:  This is an obese, well-developed, well-nourished  general in no acute distress.  HEENT:  Head normocephalic.  Nose patent.  Ears patent.  Pupils are equal,  round, and reactive to light.  Throat without injection.  NECK:  Supple without adenopathy.  Carotids 2+ without bruit.  CHEST:  Distant breath sounds that are clear.  HEART:  Regular rate and rhythm at 76 beats per minute without murmur.  Again, the sounds are distant due to his increased AP diameter of his chest.  ABDOMEN:  Soft with active bowel sounds.  No masses or organomegaly.  NEUROLOGIC:  Patient alert and oriented to time, place, and person.  Cranial  nerves II-XII grossly intact.  EXTREMITIES:  Left knee with a significant valgus deformity 5-130 degree  range of motion.  Dorsalis pedis, posterior tibialis pulses are 1+ and he  has moderate swelling of both lower legs.   X-rays show end-stage osteoarthritis of the left knee.   IMPRESSION:  End-stage osteoarthritis left knee.   PLAN:  Total knee arthroplasty left knee.      Jaquelyn Bitter. Chabon, P.A.    ______________________________  Erasmo Leventhal, M.D.    SJC/MEDQ  D:  12/17/2005  T:  12/17/2005  Job:  [id number]

## 2011-02-07 ENCOUNTER — Other Ambulatory Visit: Payer: Self-pay | Admitting: Cardiology

## 2011-06-09 DIAGNOSIS — E039 Hypothyroidism, unspecified: Secondary | ICD-10-CM | POA: Diagnosis not present

## 2011-06-09 DIAGNOSIS — E538 Deficiency of other specified B group vitamins: Secondary | ICD-10-CM | POA: Diagnosis not present

## 2011-06-09 DIAGNOSIS — M1710 Unilateral primary osteoarthritis, unspecified knee: Secondary | ICD-10-CM | POA: Diagnosis not present

## 2011-06-10 ENCOUNTER — Encounter: Payer: Self-pay | Admitting: Cardiology

## 2011-06-10 ENCOUNTER — Ambulatory Visit (INDEPENDENT_AMBULATORY_CARE_PROVIDER_SITE_OTHER): Payer: Medicare Other | Admitting: Cardiology

## 2011-06-10 DIAGNOSIS — I2510 Atherosclerotic heart disease of native coronary artery without angina pectoris: Secondary | ICD-10-CM

## 2011-06-10 DIAGNOSIS — R0609 Other forms of dyspnea: Secondary | ICD-10-CM | POA: Diagnosis not present

## 2011-06-10 DIAGNOSIS — R0989 Other specified symptoms and signs involving the circulatory and respiratory systems: Secondary | ICD-10-CM | POA: Diagnosis not present

## 2011-06-10 DIAGNOSIS — I739 Peripheral vascular disease, unspecified: Secondary | ICD-10-CM

## 2011-06-10 NOTE — Patient Instructions (Signed)
Your physician recommends that you schedule a follow-up appointment in: as scheduled  

## 2011-06-10 NOTE — Progress Notes (Signed)
HPI The patient was referred today for evaluation of "blue ankles". He reports he had these about 2 weeks ago. However, he wasn't having any other overt symptoms. He's had some chronic dyspnea which I have evaluated with a catheterization last spring. This demonstrated anatomy as described below. He's had some chronic lower extremity swelling that he doesn't think is any different. He's not having any new PND or orthopnea. He's not describing any new calf tenderness. He has resting cramping in his legs and chest but this is unchanged and chronic. He has chronic dyspnea with exertion but he does not report this to be any different than previous. He's had no cough fevers or chills. He's had no palpitations, presyncope or syncope. He denies any chest pressure, neck or arm discomfort.  No Known Allergies  Current Outpatient Prescriptions  Medication Sig Dispense Refill  . aspirin 81 MG tablet Take 81 mg by mouth daily.        . B Complex-C (B-COMPLEX WITH VITAMIN C) tablet Take 1 tablet by mouth daily.        . Cholecalciferol (VITAMIN D) 1000 UNITS capsule Take 1,000 Units by mouth daily.        . enalapril (VASOTEC) 20 MG tablet Take 20 mg by mouth daily.        . fish oil-omega-3 fatty acids 1000 MG capsule Take 1 g by mouth daily.        . furosemide (LASIX) 40 MG tablet 2 tabs po qd       . KLOR-CON M20 20 MEQ tablet TAKE ONE-HALF TABLET BY MOUTH EVERY DAY  15 each  6  . levothyroxine (SYNTHROID, LEVOTHROID) 200 MCG tablet Take 200 mcg by mouth daily.        . nitroGLYCERIN (NITROSTAT) 0.4 MG SL tablet Place 0.4 mg under the tongue every 5 (five) minutes as needed.        Marland Kitchen spironolactone (ALDACTONE) 25 MG tablet Take 25 mg by mouth daily.        Marland Kitchen tiotropium (SPIRIVA) 18 MCG inhalation capsule Place 18 mcg into inhaler and inhale daily.          Past Medical History  Diagnosis Date  . Hypothyroidism   . 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    Marland Kitchen Hernia   . Bone spur     and fused vertebrae.  He   is treated by Dr. Thomasena Edis.  Marland Kitchen CAD (coronary artery disease)   . PVD (peripheral vascular disease)     (90% left iliac stenosis)  . Aortic ectasia       no abdominal aortic aneurysm.  He is followed for this by   Dr. Gretta Began.    Past Surgical History  Procedure Date  . Pneumonectomy   . Knee surgery   . Cataract extraction   . Pilonidal cyst / sinus excision   . Coronary artery bypass graft      Median sternotomy, extracorporeal circulation,   coronary artery bypass graft surgery x3 using a left internal mammary   artery graft to left anterior descending coronary artery, with a    saphenous vein graft to the intermediate coronary artery, and a   saphenous vein graft to the right coronary artery.  Endoscopic vein   harvesting from the left leg.     ROS:  As stated in the HPI and negative for all other systems.  PHYSICAL EXAM BP 187/86  Pulse 71  Resp 18  Ht 6\' 1"  (1.854 m)  Wt 271 lb (122.925 kg)  BMI 35.75 kg/m2 GENERAL:  Well appearing HEENT:  Pupils equal round and reactive, fundi not visualized, oral mucosa unremarkable NECK:  No jugular venous distention, waveform within normal limits, carotid upstroke brisk and symmetric, no bruits, no thyromegaly LYMPHATICS:  No cervical, inguinal adenopathy LUNGS:  Expiratory wheezing BACK:  No CVA tenderness CHEST:  Well healed sternotomy scar. HEART:  PMI not displaced or sustained,S1 and S2 within normal limits, no S3, no S4, no clicks, no rubs, no murmurs ABD:  Flat, positive bowel sounds normal in frequency in pitch, no bruits, no rebound, no guarding, no midline pulsatile mass, no hepatomegaly, no splenomegaly EXT:  2 plus pulses throughout, mild ankle edema, no cyanosis no clubbing SKIN:  No rashes no nodules NEURO:  Cranial nerves II through XII grossly intact, motor grossly intact throughout PSYCH:  Cognitively intact, oriented to person place and time   EKG:  Minus  rhythm, rate 73, axis within normal limits, intervals within normal limits, poor anterior R wave progression 06/10/2011   ASSESSMENT AND PLAN

## 2011-06-10 NOTE — Assessment & Plan Note (Signed)
Was referred for the discoloration of his ankles. I think it was concern about DVT. However, I don't see any evidence of this. No further change in therapy is indicated.

## 2011-06-10 NOTE — Assessment & Plan Note (Signed)
The patient has no new sypmtoms.  No further cardiovascular testing is indicated.  We will continue with aggressive risk reduction and meds as listed.  He had the cath as reported

## 2011-06-10 NOTE — Assessment & Plan Note (Signed)
This is chronic.  His sats at rest on room air was 96% with ambulation it was 92%.   I have done an extensive work up.  At this point no change in therapy is indicated.

## 2011-08-13 DIAGNOSIS — H6120 Impacted cerumen, unspecified ear: Secondary | ICD-10-CM | POA: Diagnosis not present

## 2011-08-13 DIAGNOSIS — H7190 Unspecified cholesteatoma, unspecified ear: Secondary | ICD-10-CM | POA: Diagnosis not present

## 2011-08-18 DIAGNOSIS — J0100 Acute maxillary sinusitis, unspecified: Secondary | ICD-10-CM | POA: Diagnosis not present

## 2011-09-12 ENCOUNTER — Encounter: Payer: Self-pay | Admitting: Cardiology

## 2011-09-12 DIAGNOSIS — Z79899 Other long term (current) drug therapy: Secondary | ICD-10-CM | POA: Diagnosis not present

## 2011-09-12 DIAGNOSIS — Z125 Encounter for screening for malignant neoplasm of prostate: Secondary | ICD-10-CM | POA: Diagnosis not present

## 2011-09-12 DIAGNOSIS — E782 Mixed hyperlipidemia: Secondary | ICD-10-CM | POA: Diagnosis not present

## 2011-09-12 DIAGNOSIS — R7309 Other abnormal glucose: Secondary | ICD-10-CM | POA: Diagnosis not present

## 2011-09-12 DIAGNOSIS — E559 Vitamin D deficiency, unspecified: Secondary | ICD-10-CM | POA: Diagnosis not present

## 2011-09-12 DIAGNOSIS — Z1212 Encounter for screening for malignant neoplasm of rectum: Secondary | ICD-10-CM | POA: Diagnosis not present

## 2011-09-12 DIAGNOSIS — I10 Essential (primary) hypertension: Secondary | ICD-10-CM | POA: Diagnosis not present

## 2011-09-22 ENCOUNTER — Ambulatory Visit (HOSPITAL_COMMUNITY)
Admission: RE | Admit: 2011-09-22 | Discharge: 2011-09-22 | Disposition: A | Discharge status: Home / Self Care | Payer: Medicare Other | Source: Ambulatory Visit | Attending: Internal Medicine | Admitting: Internal Medicine

## 2011-09-22 ENCOUNTER — Other Ambulatory Visit (HOSPITAL_COMMUNITY): Payer: Self-pay | Admitting: Internal Medicine

## 2011-09-22 DIAGNOSIS — J438 Other emphysema: Secondary | ICD-10-CM | POA: Diagnosis not present

## 2011-09-22 DIAGNOSIS — J984 Other disorders of lung: Secondary | ICD-10-CM | POA: Diagnosis not present

## 2011-09-22 DIAGNOSIS — Z85118 Personal history of other malignant neoplasm of bronchus and lung: Secondary | ICD-10-CM | POA: Diagnosis not present

## 2011-09-22 DIAGNOSIS — I10 Essential (primary) hypertension: Secondary | ICD-10-CM

## 2011-09-22 DIAGNOSIS — J4489 Other specified chronic obstructive pulmonary disease: Secondary | ICD-10-CM | POA: Insufficient documentation

## 2011-09-22 DIAGNOSIS — J449 Chronic obstructive pulmonary disease, unspecified: Secondary | ICD-10-CM | POA: Diagnosis not present

## 2011-09-25 ENCOUNTER — Other Ambulatory Visit (HOSPITAL_COMMUNITY): Payer: Self-pay | Admitting: Specialist

## 2011-09-25 DIAGNOSIS — H66009 Acute suppurative otitis media without spontaneous rupture of ear drum, unspecified ear: Secondary | ICD-10-CM | POA: Diagnosis not present

## 2011-09-25 DIAGNOSIS — M25562 Pain in left knee: Secondary | ICD-10-CM

## 2011-09-25 DIAGNOSIS — H7190 Unspecified cholesteatoma, unspecified ear: Secondary | ICD-10-CM | POA: Diagnosis not present

## 2011-10-01 ENCOUNTER — Encounter (HOSPITAL_COMMUNITY)
Admission: RE | Admit: 2011-10-01 | Discharge: 2011-10-01 | Disposition: A | Discharge status: Home / Self Care | Payer: Medicare Other | Source: Ambulatory Visit | Attending: Specialist | Admitting: Specialist

## 2011-10-01 ENCOUNTER — Encounter (HOSPITAL_COMMUNITY): Payer: Self-pay

## 2011-10-01 DIAGNOSIS — M25569 Pain in unspecified knee: Secondary | ICD-10-CM | POA: Insufficient documentation

## 2011-10-01 DIAGNOSIS — Z96659 Presence of unspecified artificial knee joint: Secondary | ICD-10-CM | POA: Insufficient documentation

## 2011-10-01 DIAGNOSIS — Z471 Aftercare following joint replacement surgery: Secondary | ICD-10-CM | POA: Diagnosis not present

## 2011-10-01 DIAGNOSIS — M25562 Pain in left knee: Secondary | ICD-10-CM

## 2011-10-01 MED ORDER — TECHNETIUM TC 99M MEDRONATE IV KIT
24.5000 | PACK | Freq: Once | INTRAVENOUS | Status: AC | PRN
  Administered 2011-10-01: 24.5 via INTRAVENOUS

## 2011-10-08 DIAGNOSIS — M1710 Unilateral primary osteoarthritis, unspecified knee: Secondary | ICD-10-CM | POA: Diagnosis not present

## 2011-10-08 DIAGNOSIS — H7190 Unspecified cholesteatoma, unspecified ear: Secondary | ICD-10-CM | POA: Diagnosis not present

## 2011-11-24 DIAGNOSIS — M1710 Unilateral primary osteoarthritis, unspecified knee: Secondary | ICD-10-CM | POA: Diagnosis not present

## 2011-12-06 DIAGNOSIS — M545 Low back pain: Secondary | ICD-10-CM | POA: Diagnosis not present

## 2011-12-11 ENCOUNTER — Encounter: Payer: Self-pay | Admitting: Cardiology

## 2011-12-11 ENCOUNTER — Ambulatory Visit (INDEPENDENT_AMBULATORY_CARE_PROVIDER_SITE_OTHER): Payer: Medicare Other | Admitting: Cardiology

## 2011-12-11 VITALS — BP 121/70 | HR 70 | Ht 71.0 in | Wt 276.0 lb

## 2011-12-11 DIAGNOSIS — I2510 Atherosclerotic heart disease of native coronary artery without angina pectoris: Secondary | ICD-10-CM | POA: Diagnosis not present

## 2011-12-11 DIAGNOSIS — I739 Peripheral vascular disease, unspecified: Secondary | ICD-10-CM | POA: Diagnosis not present

## 2011-12-11 DIAGNOSIS — E785 Hyperlipidemia, unspecified: Secondary | ICD-10-CM

## 2011-12-11 NOTE — Progress Notes (Signed)
HPI The patient presents for followup of ankle swelling. He had "blue ankles" at the last visit. This has since cleared up. It was not clear the etiology of this. He has chronic lower extremity swelling. He's very limited in his activities by back and joint pain. He's had no new symptoms however. He gets dyspneic with mild exertion he wears oxygen at night. He's not having any new PND or orthopnea. He's not having any new chest pressure, neck or arm discomfort. He's gained about 5 pounds since I last saw him because he says he just eats to much.  No Known Allergies  Current Outpatient Prescriptions  Medication Sig Dispense Refill  . aspirin 81 MG tablet Take 81 mg by mouth daily.        . B Complex-C (B-COMPLEX WITH VITAMIN C) tablet Take 1 tablet by mouth daily.        . Cholecalciferol (VITAMIN D) 1000 UNITS capsule Take 1,000 Units by mouth daily.        . DULoxetine (CYMBALTA) 30 MG capsule Take 30 mg by mouth daily.      . enalapril (VASOTEC) 20 MG tablet Take 20 mg by mouth daily.        . furosemide (LASIX) 40 MG tablet 2 tabs po qd       . KLOR-CON M20 20 MEQ tablet TAKE ONE-HALF TABLET BY MOUTH EVERY DAY  15 each  6  . levothyroxine (SYNTHROID, LEVOTHROID) 200 MCG tablet Take 200 mcg by mouth daily.        . nitroGLYCERIN (NITROSTAT) 0.4 MG SL tablet Place 0.4 mg under the tongue every 5 (five) minutes as needed.        Marland Kitchen spironolactone (ALDACTONE) 25 MG tablet Take 25 mg by mouth daily.        . fish oil-omega-3 fatty acids 1000 MG capsule Take 1 g by mouth daily.          Past Medical History  Diagnosis Date  . Hypothyroidism   . 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  Marland Kitchen Hernia   . Bone spur     and fused vertebrae.  He   is treated by Dr. Thomasena Edis.  Marland Kitchen CAD (coronary artery disease)     Cath March 2012. 95% left main stenosis. Patent LIMA to the LAD, patent saphenous vein graft to OM, patent saphenous vein graft to the right  coronary artery.  Marland Kitchen PVD (peripheral vascular disease)     (90% left iliac stenosis)  . Aortic ectasia       no abdominal aortic aneurysm.  He is followed for this by   Dr. Gretta Began.    Past Surgical History  Procedure Date  . Pneumonectomy   . Knee surgery   . Cataract extraction   . Pilonidal cyst / sinus excision   . Coronary artery bypass graft      2010 Median sternotomy, extracorporeal circulation,   coronary artery bypass graft surgery x3 using a left internal mammary   artery graft to left anterior descending coronary artery, with a    saphenous vein graft to the intermediate coronary artery, and a   saphenous vein graft to the right coronary artery.  Endoscopic vein   harvesting from the left leg.     ROS:  As stated in the HPI and negative for all other systems.  PHYSICAL EXAM BP 121/70  Pulse 70  Ht 5\' 11"  (1.803  m)  Wt 276 lb (125.193 kg)  BMI 38.49 kg/m2  SpO2 97% GENERAL:  Well appearing HEENT:  Pupils equal round and reactive, fundi not visualized, oral mucosa unremarkable NECK:  No jugular venous distention, waveform within normal limits, carotid upstroke brisk and symmetric, no bruits, no thyromegaly LYMPHATICS:  No cervical, inguinal adenopathy LUNGS:  Expiratory wheezing BACK:  No CVA tenderness CHEST:  Well healed sternotomy scar. HEART:  PMI not displaced or sustained,S1 and S2 within normal limits, no S3, no S4, no clicks, no rubs, no murmurs ABD:  Flat, positive bowel sounds normal in frequency in pitch, no bruits, no rebound, no guarding, no midline pulsatile mass, no hepatomegaly, no splenomegaly EXT:  2 plus pulses throughout, moderate ankle edema, no cyanosis no clubbing   EKG:  Sinus rhythm, rate 66, axis within normal limits, intervals within normal limits, poor anterior R wave progression 12/11/2011   ASSESSMENT AND PLAN  CAD -  The patient has no new sypmtoms. No further cardiovascular testing is indicated. We will continue with aggressive  risk reduction and meds as listed. He had the cath as reported   DYSPNEA ON EXERTION -  This is chronic. I suspect a fair amount of this is secondary to his weight and deconditioning.  At this point no change in therapy is indicated.

## 2011-12-11 NOTE — Patient Instructions (Addendum)
The current medical regimen is effective;  continue present plan and medications.  Follow up with Dr Hochrein in 6 months  

## 2012-01-07 DIAGNOSIS — Z79899 Other long term (current) drug therapy: Secondary | ICD-10-CM | POA: Diagnosis not present

## 2012-01-07 DIAGNOSIS — R209 Unspecified disturbances of skin sensation: Secondary | ICD-10-CM | POA: Diagnosis not present

## 2012-01-07 DIAGNOSIS — R5381 Other malaise: Secondary | ICD-10-CM | POA: Diagnosis not present

## 2012-01-15 DIAGNOSIS — M545 Low back pain: Secondary | ICD-10-CM | POA: Diagnosis not present

## 2012-01-30 NOTE — Addendum Note (Signed)
Addended by: Disaya Walt, Bartolo Darter D on: 01/30/2012 03:01 PM   Modules accepted: Orders

## 2012-02-12 DIAGNOSIS — M5137 Other intervertebral disc degeneration, lumbosacral region: Secondary | ICD-10-CM | POA: Diagnosis not present

## 2012-02-12 DIAGNOSIS — M545 Low back pain: Secondary | ICD-10-CM | POA: Diagnosis not present

## 2012-03-22 DIAGNOSIS — S2239XA Fracture of one rib, unspecified side, initial encounter for closed fracture: Secondary | ICD-10-CM | POA: Diagnosis not present

## 2012-03-23 ENCOUNTER — Ambulatory Visit (HOSPITAL_COMMUNITY)
Admission: RE | Admit: 2012-03-23 | Discharge: 2012-03-23 | Disposition: A | Discharge status: Home / Self Care | Payer: Medicare Other | Source: Ambulatory Visit | Attending: Internal Medicine | Admitting: Internal Medicine

## 2012-03-23 ENCOUNTER — Other Ambulatory Visit (HOSPITAL_COMMUNITY): Payer: Self-pay | Admitting: Internal Medicine

## 2012-03-23 DIAGNOSIS — J449 Chronic obstructive pulmonary disease, unspecified: Secondary | ICD-10-CM | POA: Diagnosis not present

## 2012-03-23 DIAGNOSIS — S2239XA Fracture of one rib, unspecified side, initial encounter for closed fracture: Secondary | ICD-10-CM

## 2012-03-23 DIAGNOSIS — S6990XA Unspecified injury of unspecified wrist, hand and finger(s), initial encounter: Secondary | ICD-10-CM | POA: Diagnosis not present

## 2012-03-23 DIAGNOSIS — R52 Pain, unspecified: Secondary | ICD-10-CM | POA: Diagnosis not present

## 2012-03-23 DIAGNOSIS — M25529 Pain in unspecified elbow: Secondary | ICD-10-CM | POA: Insufficient documentation

## 2012-03-23 DIAGNOSIS — R079 Chest pain, unspecified: Secondary | ICD-10-CM | POA: Diagnosis not present

## 2012-03-23 DIAGNOSIS — S59919A Unspecified injury of unspecified forearm, initial encounter: Secondary | ICD-10-CM | POA: Diagnosis not present

## 2012-03-23 DIAGNOSIS — J4489 Other specified chronic obstructive pulmonary disease: Secondary | ICD-10-CM | POA: Insufficient documentation

## 2012-03-23 DIAGNOSIS — S5290XA Unspecified fracture of unspecified forearm, initial encounter for closed fracture: Secondary | ICD-10-CM

## 2012-04-08 DIAGNOSIS — H7190 Unspecified cholesteatoma, unspecified ear: Secondary | ICD-10-CM | POA: Diagnosis not present

## 2012-06-23 ENCOUNTER — Encounter: Payer: Self-pay | Admitting: Cardiology

## 2012-06-23 ENCOUNTER — Ambulatory Visit (INDEPENDENT_AMBULATORY_CARE_PROVIDER_SITE_OTHER): Payer: Medicare Other | Admitting: Cardiology

## 2012-06-23 VITALS — BP 140/90 | HR 86 | Ht 72.0 in | Wt 253.0 lb

## 2012-06-23 DIAGNOSIS — R0989 Other specified symptoms and signs involving the circulatory and respiratory systems: Secondary | ICD-10-CM

## 2012-06-23 DIAGNOSIS — R0609 Other forms of dyspnea: Secondary | ICD-10-CM

## 2012-06-23 DIAGNOSIS — I739 Peripheral vascular disease, unspecified: Secondary | ICD-10-CM

## 2012-06-23 DIAGNOSIS — E785 Hyperlipidemia, unspecified: Secondary | ICD-10-CM | POA: Diagnosis not present

## 2012-06-23 DIAGNOSIS — I2510 Atherosclerotic heart disease of native coronary artery without angina pectoris: Secondary | ICD-10-CM | POA: Diagnosis not present

## 2012-06-23 NOTE — Progress Notes (Signed)
HPI The patient presents for followup of CAD and dyspnea.  The patient denies any new shortness of breath. He wears oxygen at night and is supposed to wear it more during the day but he doesn't.: Coincidently he had an episode of chest pain 2 nights ago. He had only been scheduled for this appointment. He had cramping in both of his arms and in his chest. He thought this was similar to previous angina. He took 2 nitroglycerin sprays and chewed a baby aspirin and it went away at in 10 minutes.  He has had no recurrent discomfort. There was some mild sweatiness with this. He wasn't nauseated didn't vomit. He didn't have any jaw discomfort. It was moderate to severe in intensity. He's not describing any new PND or orthopnea. He has had no new palpitations, presyncope or syncope.  No Known Allergies  Current Outpatient Prescriptions  Medication Sig Dispense Refill  . aspirin 81 MG tablet Take 81 mg by mouth daily.        . B Complex-C (B-COMPLEX WITH VITAMIN C) tablet Take 1 tablet by mouth daily.        . Cholecalciferol (VITAMIN D) 1000 UNITS capsule Take 1,000 Units by mouth daily.        . DULoxetine (CYMBALTA) 30 MG capsule Take 30 mg by mouth daily.      . enalapril (VASOTEC) 20 MG tablet Take 20 mg by mouth daily.        . fish oil-omega-3 fatty acids 1000 MG capsule Take 1 g by mouth daily.        . furosemide (LASIX) 40 MG tablet 2 tabs po qd       . KLOR-CON M20 20 MEQ tablet TAKE ONE-HALF TABLET BY MOUTH EVERY DAY  15 each  6  . levothyroxine (SYNTHROID, LEVOTHROID) 200 MCG tablet Take 200 mcg by mouth daily.        . nitroGLYCERIN (NITROSTAT) 0.4 MG SL tablet Place 0.4 mg under the tongue every 5 (five) minutes as needed.        Marland Kitchen spironolactone (ALDACTONE) 25 MG tablet Take 25 mg by mouth daily.        . TRAMADOL HCL PO Take by mouth daily.        Past Medical History  Diagnosis Date  . Hypothyroidism   . 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  Marland Kitchen Hernia   . Bone spur     and fused vertebrae.  He   is treated by Dr. Thomasena Edis.  Marland Kitchen CAD (coronary artery disease)     Cath March 2012. 95% left main stenosis. Patent LIMA to the LAD, patent saphenous vein graft to OM, patent saphenous vein graft to the right coronary artery.  Marland Kitchen PVD (peripheral vascular disease)     (90% left iliac stenosis)  . Aortic ectasia       no abdominal aortic aneurysm.  He is followed for this by   Dr. Gretta Began.    Past Surgical History  Procedure Date  . Pneumonectomy   . Knee surgery   . Cataract extraction   . Pilonidal cyst / sinus excision   . Coronary artery bypass graft      2010 Median sternotomy, extracorporeal circulation,   coronary artery bypass graft surgery x3 using a left internal mammary   artery graft to left anterior descending coronary artery, with a    saphenous vein graft to the  intermediate coronary artery, and a   saphenous vein graft to the right coronary artery.  Endoscopic vein   harvesting from the left leg.     ROS:  As stated in the HPI and negative for all other systems.  PHYSICAL EXAM BP 140/90  Pulse 86  Ht 6' (1.829 m)  Wt 253 lb (114.76 kg)  BMI 34.31 kg/m2 GENERAL:  Well appearing HEENT:  Pupils equal round and reactive, fundi not visualized, oral mucosa unremarkable NECK:  No jugular venous distention, waveform within normal limits, carotid upstroke brisk and symmetric, no bruits, no thyromegaly LYMPHATICS:  No cervical, inguinal adenopathy LUNGS:  Expiratory wheezing BACK:  No CVA tenderness CHEST:  Well healed sternotomy scar. HEART:  PMI not displaced or sustained,S1 and S2 within normal limits, no S3, no S4, no clicks, no rubs, no murmurs ABD:  Flat, positive bowel sounds normal in frequency in pitch, no bruits, no rebound, no guarding, no midline pulsatile mass, no hepatomegaly, no splenomegaly EXT:  2 plus pulses throughout, moderate ankle edema, no cyanosis no clubbing   EKG:  Sinus rhythm, rate  86, axis within normal limits, intervals within normal limits, no acute ST T wave changes. 06/23/2012   ASSESSMENT AND PLAN  CAD -  Given the fact that this was his one and only episode and he since I last saw him he will continue with the current regimen. However, if he is requiring more nitroglycerin or has severe episodes he will let us know or call 911.  DYSPNEA ON EXERTION -  This is chronic. I suspect a fair amount of this is secondary to his weight and deconditioning.  At this point no change in therapy is indicated.

## 2012-06-23 NOTE — Patient Instructions (Addendum)
The current medical regimen is effective;  continue present plan and medications.  Follow up in 4 months with Dr Hochrein 

## 2012-09-16 DIAGNOSIS — Z79899 Other long term (current) drug therapy: Secondary | ICD-10-CM | POA: Diagnosis not present

## 2012-09-16 DIAGNOSIS — E559 Vitamin D deficiency, unspecified: Secondary | ICD-10-CM | POA: Diagnosis not present

## 2012-09-16 DIAGNOSIS — I10 Essential (primary) hypertension: Secondary | ICD-10-CM | POA: Diagnosis not present

## 2012-09-16 DIAGNOSIS — Z7901 Long term (current) use of anticoagulants: Secondary | ICD-10-CM | POA: Diagnosis not present

## 2012-09-16 DIAGNOSIS — Z125 Encounter for screening for malignant neoplasm of prostate: Secondary | ICD-10-CM | POA: Diagnosis not present

## 2012-09-16 DIAGNOSIS — E782 Mixed hyperlipidemia: Secondary | ICD-10-CM | POA: Diagnosis not present

## 2012-09-16 DIAGNOSIS — R7309 Other abnormal glucose: Secondary | ICD-10-CM | POA: Diagnosis not present

## 2012-10-07 DIAGNOSIS — H7190 Unspecified cholesteatoma, unspecified ear: Secondary | ICD-10-CM | POA: Diagnosis not present

## 2012-10-25 ENCOUNTER — Encounter: Payer: Self-pay | Admitting: Cardiology

## 2012-10-25 ENCOUNTER — Ambulatory Visit (INDEPENDENT_AMBULATORY_CARE_PROVIDER_SITE_OTHER): Payer: Medicare Other | Admitting: Cardiology

## 2012-10-25 VITALS — BP 160/80 | HR 72 | Ht 72.0 in | Wt 267.8 lb

## 2012-10-25 DIAGNOSIS — I739 Peripheral vascular disease, unspecified: Secondary | ICD-10-CM

## 2012-10-25 DIAGNOSIS — I2510 Atherosclerotic heart disease of native coronary artery without angina pectoris: Secondary | ICD-10-CM | POA: Diagnosis not present

## 2012-10-25 MED ORDER — ISOSORBIDE MONONITRATE ER 60 MG PO TB24: 90.0000 mg | ORAL_TABLET | Freq: Every day | ORAL | Status: DC

## 2012-10-25 MED ORDER — NITROGLYCERIN 0.4 MG SL SUBL: 0.4000 mg | SUBLINGUAL_TABLET | SUBLINGUAL | Status: DC | PRN

## 2012-10-25 NOTE — Patient Instructions (Addendum)
Please increase your Isosorbide to 90 mg a day. Continue all other medications as listed.  Follow up in 6 months with Dr Antoine Poche.  You will receive a letter in the mail 2 months before you are due.  Please call us when you receive this letter to schedule your follow up appointment.

## 2012-10-25 NOTE — Progress Notes (Signed)
HPI The patient presents for followup of CAD and dyspnea.  The patient is mostly limited by joint pain. This cannot be managed surgically. He has chronic dyspnea with exertion. Sometimes when he is particularly exerting himself for instance such as in a store he will get some discomfort and he will go and get his oxygen. His symptoms improved at that point. He hasn't taken any sublingual nitroglycerin. He does get some substernal chest pressure as described. He does not have any associated nausea vomiting or diaphoresis. He has no palpitations, presyncope or syncope. He's not describing resting shortness of breath, PND or orthopnea.  No Known Allergies  Current Outpatient Prescriptions  Medication Sig Dispense Refill  . aspirin 81 MG tablet Take 81 mg by mouth daily.        . B Complex-C (B-COMPLEX WITH VITAMIN C) tablet Take 1 tablet by mouth daily.        . Cholecalciferol (VITAMIN D) 1000 UNITS capsule Take 1,000 Units by mouth daily.        Marland Kitchen co-enzyme Q-10 30 MG capsule Take 400 mg by mouth daily.      . enalapril (VASOTEC) 20 MG tablet Take 20 mg by mouth daily.        . fish oil-omega-3 fatty acids 1000 MG capsule Take 1 g by mouth daily.        . furosemide (LASIX) 40 MG tablet 2 tabs po qd       . HYDROcodone-acetaminophen (NORCO/VICODIN) 5-325 MG per tablet Take 1 tablet by mouth every 6 (six) hours as needed for pain.      . ISOSORBIDE MONONITRATE PO Take by mouth.      . levothyroxine (SYNTHROID, LEVOTHROID) 200 MCG tablet Take 200 mcg by mouth daily.        Marland Kitchen MAGNESIUM PO Take by mouth.      . Multiple Vitamin (MULTIVITAMIN) tablet Take 1 tablet by mouth daily.      . nitroGLYCERIN (NITROSTAT) 0.4 MG SL tablet Place 0.4 mg under the tongue every 5 (five) minutes as needed.         No current facility-administered medications for this visit.    Past Medical History  Diagnosis Date  . Hypothyroidism   . Skin cancer     recent squamous cell carcinoma removed from right    index finger.  Marland Kitchen Hernia   . Bone spur     and fused vertebrae.  He   is treated by Dr. Thomasena Edis.  Marland Kitchen CAD (coronary artery disease)     Cath March 2012. 95% left main stenosis. Patent LIMA to the LAD, patent saphenous vein graft to OM, patent saphenous vein graft to the right coronary artery.  Marland Kitchen PVD (peripheral vascular disease)     (90% left iliac stenosis)  . Aortic ectasia       no abdominal aortic aneurysm.  He is followed for this by   Dr. Gretta Began.    Past Surgical History  Procedure Laterality Date  . Pneumonectomy    . Knee surgery    . Cataract extraction    . Pilonidal cyst / sinus excision    . Coronary artery bypass graft       2010 Median sternotomy, extracorporeal circulation,   coronary artery bypass graft surgery x3 using a left internal mammary   artery graft to left anterior descending coronary artery, with a    saphenous vein graft to the intermediate coronary artery, and a   saphenous vein graft  to the right coronary artery.  Endoscopic vein   harvesting from the left leg.     ROS:  As stated in the HPI and negative for all other systems.  PHYSICAL EXAM BP 160/80  Pulse 72  Ht 6' (1.829 m)  Wt 267 lb 12.8 oz (121.473 kg)  BMI 36.31 kg/m2 GENERAL:  Well appearing HEENT:  Pupils equal round and reactive, fundi not visualized, oral mucosa unremarkable NECK:  No jugular venous distention, waveform within normal limits, carotid upstroke brisk and symmetric, no bruits, no thyromegaly LYMPHATICS:  No cervical, inguinal adenopathy LUNGS:  Expiratory wheezing BACK:  No CVA tenderness CHEST:  Well healed sternotomy scar. HEART:  PMI not displaced or sustained,S1 and S2 within normal limits, no S3, no S4, no clicks, no rubs, no murmurs ABD:  Flat, positive bowel sounds normal in frequency in pitch, no bruits, no rebound, no guarding, no midline pulsatile mass, no hepatomegaly, no splenomegaly EXT:  2 plus pulses throughout, moderate ankle edema, no cyanosis no  clubbing   EKG:  Sinus rhythm, rate 72, axis within normal limits, intervals within normal limits, no acute ST T wave changes. PACs.  10/25/2012   ASSESSMENT AND PLAN  CAD -  His chest discomfort is relieved with oxygen. However, I will increase his Imdur to 90 mg daily. I will renew his sublingual nitroglycerin. We will otherwise consider conservative management.  DYSPNEA ON EXERTION -  This is chronic. I suspect a fair amount of this is secondary to his weight and deconditioning.  At this point no change in therapy is indicated.

## 2012-10-29 DIAGNOSIS — K5900 Constipation, unspecified: Secondary | ICD-10-CM | POA: Diagnosis not present

## 2012-11-21 ENCOUNTER — Emergency Department (HOSPITAL_COMMUNITY): Payer: Medicare Other

## 2012-11-21 ENCOUNTER — Encounter (HOSPITAL_COMMUNITY): Payer: Self-pay | Admitting: Emergency Medicine

## 2012-11-21 ENCOUNTER — Emergency Department (HOSPITAL_COMMUNITY)
Admission: EM | Admit: 2012-11-21 | Discharge: 2012-11-21 | Disposition: A | Discharge status: Home / Self Care | Payer: Medicare Other | Attending: Emergency Medicine | Admitting: Emergency Medicine

## 2012-11-21 DIAGNOSIS — Z951 Presence of aortocoronary bypass graft: Secondary | ICD-10-CM | POA: Insufficient documentation

## 2012-11-21 DIAGNOSIS — E039 Hypothyroidism, unspecified: Secondary | ICD-10-CM | POA: Insufficient documentation

## 2012-11-21 DIAGNOSIS — K5900 Constipation, unspecified: Secondary | ICD-10-CM | POA: Diagnosis not present

## 2012-11-21 DIAGNOSIS — R5381 Other malaise: Secondary | ICD-10-CM | POA: Insufficient documentation

## 2012-11-21 DIAGNOSIS — Z8679 Personal history of other diseases of the circulatory system: Secondary | ICD-10-CM | POA: Diagnosis not present

## 2012-11-21 DIAGNOSIS — Z85828 Personal history of other malignant neoplasm of skin: Secondary | ICD-10-CM | POA: Insufficient documentation

## 2012-11-21 DIAGNOSIS — R509 Fever, unspecified: Secondary | ICD-10-CM | POA: Insufficient documentation

## 2012-11-21 DIAGNOSIS — I2510 Atherosclerotic heart disease of native coronary artery without angina pectoris: Secondary | ICD-10-CM | POA: Diagnosis not present

## 2012-11-21 DIAGNOSIS — Z7982 Long term (current) use of aspirin: Secondary | ICD-10-CM | POA: Insufficient documentation

## 2012-11-21 DIAGNOSIS — Z87891 Personal history of nicotine dependence: Secondary | ICD-10-CM | POA: Diagnosis not present

## 2012-11-21 DIAGNOSIS — Z8739 Personal history of other diseases of the musculoskeletal system and connective tissue: Secondary | ICD-10-CM | POA: Insufficient documentation

## 2012-11-21 DIAGNOSIS — Z8719 Personal history of other diseases of the digestive system: Secondary | ICD-10-CM | POA: Insufficient documentation

## 2012-11-21 DIAGNOSIS — Z79899 Other long term (current) drug therapy: Secondary | ICD-10-CM | POA: Insufficient documentation

## 2012-11-21 DIAGNOSIS — R109 Unspecified abdominal pain: Secondary | ICD-10-CM | POA: Diagnosis not present

## 2012-11-21 HISTORY — DX: Constipation, unspecified: K59.00

## 2012-11-21 MED ORDER — PSYLLIUM 28 % PO PACK: 1.0000 | PACK | Freq: Two times a day (BID) | ORAL | Status: DC

## 2012-11-21 MED ORDER — MINERAL OIL RE ENEM
1.0000 | ENEMA | Freq: Once | RECTAL | Status: AC
  Administered 2012-11-21: 1 via RECTAL
  Filled 2012-11-21: qty 1

## 2012-11-21 MED ORDER — BISACODYL 10 MG RE SUPP: 10.0000 mg | Freq: Once | RECTAL | Status: DC

## 2012-11-21 NOTE — ED Notes (Signed)
Pt states that he has had constipation for the past 3 weeks and has taken multiple different things for it with no relief. Has chronic constpation. Denies any n/v . Had a small amount a few days ago.

## 2012-11-21 NOTE — ED Provider Notes (Signed)
History    CSN: [id number] Arrival date & time 11/21/12  1048  First MD Initiated Contact with Patient 11/21/12 1147     Chief Complaint  Patient presents with  . Constipation   (Consider location/radiation/quality/duration/timing/severity/associated sxs/prior Treatment) HPI Pt is an 79yo male with hx of constipation presenting with gradually worsening 3 week hx of constipation, stating he has not had a substantial BM for the past 3 weeks.  He has tried myralax, mineral oil, exlax, and dulcolax stool softeners but has not tried any suppositories.  Reports feeling some lower abdominal pain and discomfort that started about 3 weeks ago but has improved some since initial onset.  Reports being able to pass gas and liquid stool w/o blood or mucous but not much else. Denies nausea or vomiting but states he did feel "hot" yesterday.  Did not take temperature.  Denies any abdominal surgeries but PMH shows previous hernia.    Past Medical History  Diagnosis Date  . Hypothyroidism   . 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  Marland Kitchen Hernia   . Bone spur     and fused vertebrae.  He   is treated by Dr. Thomasena Edis.  Marland Kitchen CAD (coronary artery disease)     Cath March 2012. 95% left main stenosis. Patent LIMA to the LAD, patent saphenous vein graft to OM, patent saphenous vein graft to the right coronary artery.  Marland Kitchen PVD (peripheral vascular disease)     (90% left iliac stenosis)  . Aortic ectasia       no abdominal aortic aneurysm.  He is followed for this by   Dr. Gretta Began.  . Constipated    Past Surgical History  Procedure Laterality Date  . Pneumonectomy    . Knee surgery    . Cataract extraction    . Pilonidal cyst / sinus excision    . Coronary artery bypass graft       2010 Median sternotomy, extracorporeal circulation,   coronary artery bypass graft surgery x3 using a left internal mammary   artery graft to left anterior descending coronary artery, with  a    saphenous vein graft to the intermediate coronary artery, and a   saphenous vein graft to the right coronary artery.  Endoscopic vein   harvesting from the left leg.    No family history on file. History  Substance Use Topics  . Smoking status: Former Smoker    Quit date: 06/23/2004  . Smokeless tobacco: Never Used  . Alcohol Use: No    Review of Systems  Constitutional: Positive for fever ( "felt hot"). Negative for chills and diaphoresis.  Respiratory: Negative for cough and shortness of breath.   Gastrointestinal: Positive for abdominal pain ( mild, lower abomdinal pain) and constipation. Negative for nausea, vomiting, diarrhea and abdominal distention.  Genitourinary: Negative for dysuria, hematuria and flank pain.  All other systems reviewed and are negative.    Allergies  Review of patient's allergies indicates no known allergies.  Home Medications   Current Outpatient Rx  Name  Route  Sig  Dispense  Refill  . aspirin 81 MG tablet   Oral   Take 81 mg by mouth daily.           . Cholecalciferol (VITAMIN D) 2000 UNITS tablet   Oral   Take 2,000 Units by mouth daily.         . Coenzyme Q10 400 MG CAPS  Oral   Take 1 capsule by mouth daily.         . enalapril (VASOTEC) 20 MG tablet   Oral   Take 20 mg by mouth daily.           . fish oil-omega-3 fatty acids 1000 MG capsule   Oral   Take 1 g by mouth daily.           . furosemide (LASIX) 40 MG tablet   Oral   Take 40 mg by mouth daily.          . isosorbide mononitrate (IMDUR) 60 MG 24 hr tablet   Oral   Take 1.5 tablets (90 mg total) by mouth daily.   45 tablet   11   . levothyroxine (SYNTHROID, LEVOTHROID) 200 MCG tablet   Oral   Take 200 mcg by mouth daily.           Marland Kitchen MAGNESIUM PO   Oral   Take 1 tablet by mouth daily.          . Multiple Vitamin (MULTIVITAMIN) tablet   Oral   Take 1 tablet by mouth daily.         . nitroGLYCERIN (NITROSTAT) 0.4 MG SL tablet    Sublingual   Place 1 tablet (0.4 mg total) under the tongue every 5 (five) minutes as needed.   25 tablet   11   . psyllium (METAMUCIL SMOOTH TEXTURE) 28 % packet   Oral   Take 1 packet by mouth 2 (two) times daily.   30 packet   0    BP 144/67  Pulse 88  Temp(Src) 98 F (36.7 C)  Resp 18  SpO2 100% Physical Exam  Nursing note and vitals reviewed. Constitutional: He appears well-developed and well-nourished. No distress.  Pleasant elderly male lying in exam bed, NAD.  HENT:  Head: Normocephalic and atraumatic.  Eyes: Conjunctivae are normal. No scleral icterus.  Neck: Normal range of motion.  Cardiovascular: Normal rate, regular rhythm and normal heart sounds.   Pulmonary/Chest: Effort normal. No respiratory distress. He has wheezes ( mild, diffuse). He has no rales. He exhibits no tenderness.  Abdominal: Soft. He exhibits no distension and no mass. Bowel sounds are decreased. There is tenderness ( right lower abdomen ). There is no rebound and no guarding.  Genitourinary:  No internal hemorrhoids or fecal impaction palpated   Musculoskeletal: Normal range of motion.  Neurological: He is alert.  Skin: Skin is warm and dry. He is not diaphoretic.    ED Course  Procedures (including critical care time) Labs Reviewed - No data to display Dg Abd 1 View  11/21/2012   *RADIOLOGY REPORT*  Clinical Data: Abdominal pain, constipation through 3 weeks.  ABDOMEN - 1 VIEW  Comparison: None.  Findings: The bowel, soft tissue planes and bony structures appear to be normal.  There is moderate stool the colon.  No evidence of fecal impaction.  No evidence of ileus or abnormal calcifications. No acute bony changes.  IMPRESSION: No acute findings.  Normal volume of stool in the colon without dilatation.   Original Report Authenticated By: Sander Radon, M.D.   1. Constipation     MDM  Not concerned for SBO, will tx as constipation with mineral oil enema since pt has not tried  suppository at home.  Abd upright: no acute findings. Normal volume of stool in colon w/o dilatation   Enema attempted in ED per pt request.  No BM achieved. Rx: psyllium.  Will discharge pt home and have him f/u with . Return precautions given. Pt verbalized understanding and agreement with tx plan. Vitals: unremarkable. Discharged in stable condition.    Discussed pt with attending during ED encounter.   Junius Finner, PA-C 11/21/12 1448

## 2012-11-22 NOTE — ED Provider Notes (Signed)
Medical screening examination/treatment/procedure(s) were conducted as a shared visit with non-physician practitioner(s) and myself.  I personally evaluated the patient during the encounter Pt c/o constipation.  Had small bm yesterday. No abd distension or nv. Rectal, no impaction felt. kub without obstruction. rec colace, miralax prn.   Suzi Roots, MD 11/22/12 [phone]

## 2012-12-16 DIAGNOSIS — I10 Essential (primary) hypertension: Secondary | ICD-10-CM | POA: Diagnosis not present

## 2012-12-16 DIAGNOSIS — R7309 Other abnormal glucose: Secondary | ICD-10-CM | POA: Diagnosis not present

## 2012-12-16 DIAGNOSIS — E538 Deficiency of other specified B group vitamins: Secondary | ICD-10-CM | POA: Diagnosis not present

## 2012-12-16 DIAGNOSIS — Z79899 Other long term (current) drug therapy: Secondary | ICD-10-CM | POA: Diagnosis not present

## 2012-12-16 DIAGNOSIS — N3000 Acute cystitis without hematuria: Secondary | ICD-10-CM | POA: Diagnosis not present

## 2012-12-16 DIAGNOSIS — E782 Mixed hyperlipidemia: Secondary | ICD-10-CM | POA: Diagnosis not present

## 2012-12-16 DIAGNOSIS — D649 Anemia, unspecified: Secondary | ICD-10-CM | POA: Diagnosis not present

## 2012-12-16 DIAGNOSIS — E559 Vitamin D deficiency, unspecified: Secondary | ICD-10-CM | POA: Diagnosis not present

## 2013-03-17 DIAGNOSIS — E559 Vitamin D deficiency, unspecified: Secondary | ICD-10-CM | POA: Diagnosis not present

## 2013-03-17 DIAGNOSIS — R7309 Other abnormal glucose: Secondary | ICD-10-CM | POA: Diagnosis not present

## 2013-03-17 DIAGNOSIS — Z79899 Other long term (current) drug therapy: Secondary | ICD-10-CM | POA: Diagnosis not present

## 2013-03-17 DIAGNOSIS — E782 Mixed hyperlipidemia: Secondary | ICD-10-CM | POA: Diagnosis not present

## 2013-03-17 DIAGNOSIS — I10 Essential (primary) hypertension: Secondary | ICD-10-CM | POA: Diagnosis not present

## 2013-03-29 ENCOUNTER — Encounter: Payer: Self-pay | Admitting: Internal Medicine

## 2013-03-29 ENCOUNTER — Telehealth: Payer: Self-pay | Admitting: Internal Medicine

## 2013-03-29 ENCOUNTER — Other Ambulatory Visit: Payer: Self-pay | Admitting: *Deleted

## 2013-03-29 MED ORDER — FUROSEMIDE 40 MG PO TABS: 40.0000 mg | ORAL_TABLET | Freq: Two times a day (BID) | ORAL | Status: DC

## 2013-03-29 MED ORDER — CYCLOBENZAPRINE HCL 10 MG PO TABS: 10.0000 mg | ORAL_TABLET | Freq: Three times a day (TID) | ORAL | Status: DC | PRN

## 2013-03-29 MED ORDER — ENALAPRIL MALEATE 20 MG PO TABS: 20.0000 mg | ORAL_TABLET | Freq: Every day | ORAL | Status: DC

## 2013-03-29 NOTE — Telephone Encounter (Signed)
REFILL ON ENALAPRIL 20MG  [id number]

## 2013-05-27 ENCOUNTER — Encounter (INDEPENDENT_AMBULATORY_CARE_PROVIDER_SITE_OTHER): Payer: Self-pay

## 2013-05-27 ENCOUNTER — Encounter: Payer: Self-pay | Admitting: Cardiology

## 2013-05-27 ENCOUNTER — Ambulatory Visit (INDEPENDENT_AMBULATORY_CARE_PROVIDER_SITE_OTHER): Payer: Medicare Other | Admitting: Cardiology

## 2013-05-27 VITALS — BP 168/88 | HR 74 | Ht 72.0 in | Wt 253.1 lb

## 2013-05-27 DIAGNOSIS — I2510 Atherosclerotic heart disease of native coronary artery without angina pectoris: Secondary | ICD-10-CM

## 2013-05-27 NOTE — Patient Instructions (Signed)
Your physician wants you to follow-up in:   YEAR WITH  DR HOCHREIN You will receive a reminder letter in the mail two months in advance. If you don't receive a letter, please call our office to schedule the follow-up appointment. Your physician recommends that you continue on your current medications as directed. Please refer to the Current Medication list given to you today. 

## 2013-05-27 NOTE — Progress Notes (Signed)
HPI The patient presents for followup of CAD and dyspnea.  The patient is mostly limited by joint pain. This cannot be managed surgically. He has chronic dyspnea with exertion. At the last visit he was describing some chest discomfort. However, he doesn't really recall this and doesn't complain of this currently.  He denies any PND or orthopnea.  However, again his is very limited.  He does not take NTG.  He doesn't report any acute PND or orthopnea.   No Known Allergies  Current Outpatient Prescriptions  Medication Sig Dispense Refill  . aspirin 81 MG tablet Take 81 mg by mouth daily.        . Cholecalciferol (VITAMIN D) 2000 UNITS tablet Take 2,000 Units by mouth daily.      . Coenzyme Q10 400 MG CAPS Take 1 capsule by mouth daily.      . cyclobenzaprine (FLEXERIL) 10 MG tablet Take 1 tablet (10 mg total) by mouth every 8 (eight) hours as needed for muscle spasms.  90 tablet  0  . enalapril (VASOTEC) 20 MG tablet Take 1 tablet (20 mg total) by mouth daily.  90 tablet  0  . fish oil-omega-3 fatty acids 1000 MG capsule Take 1 g by mouth daily.        . furosemide (LASIX) 40 MG tablet Take 40 mg by mouth daily.      . isosorbide mononitrate (IMDUR) 60 MG 24 hr tablet Take 1.5 tablets (90 mg total) by mouth daily.  45 tablet  11  . levothyroxine (SYNTHROID, LEVOTHROID) 200 MCG tablet Take 200 mcg by mouth daily.        Marland Kitchen MAGNESIUM PO Take 1 tablet by mouth daily.       . Multiple Vitamin (MULTIVITAMIN) tablet Take 1 tablet by mouth daily.      . nitroGLYCERIN (NITROSTAT) 0.4 MG SL tablet Place 1 tablet (0.4 mg total) under the tongue every 5 (five) minutes as needed.  25 tablet  11  . potassium chloride 20 MEQ/15ML (10%) solution Take 20 mEq by mouth daily.       . psyllium (METAMUCIL SMOOTH TEXTURE) 28 % packet Take 1 packet by mouth 2 (two) times daily.  30 packet  0   No current facility-administered medications for this visit.    Past Medical History  Diagnosis Date  . Hypothyroidism    . 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  Marland Kitchen Hernia   . Bone spur     and fused vertebrae.  He   is treated by Dr. Theda Sers.  Marland Kitchen CAD (coronary artery disease)     Cath March 2012. 95% left main stenosis. Patent LIMA to the LAD, patent saphenous vein graft to OM, patent saphenous vein graft to the right coronary artery.  Marland Kitchen PVD (peripheral vascular disease)     (90% left iliac stenosis)  . Aortic ectasia       no abdominal aortic aneurysm.  He is followed for this by   Dr. Curt Jews.  . Constipated   . Hypertension   . COPD (chronic obstructive pulmonary disease)   . Hyperlipidemia   . Abdominal aortic aneurysm     3.3 cm  . Pre-diabetes   . Vitamin D deficiency     Past Surgical History  Procedure Laterality Date  . Pneumonectomy    . Knee surgery    . Cataract extraction    . Pilonidal cyst / sinus excision    .  Coronary artery bypass graft       2010 Median sternotomy, extracorporeal circulation,   coronary artery bypass graft surgery x3 using a left internal mammary   artery graft to left anterior descending coronary artery, with a    saphenous vein graft to the intermediate coronary artery, and a   saphenous vein graft to the right coronary artery.  Endoscopic vein   harvesting from the left leg.     ROS:  As stated in the HPI and negative for all other systems.  PHYSICAL EXAM BP 168/88  Pulse 74  Ht 6' (1.829 m)  Wt 253 lb 1.9 oz (114.814 kg)  BMI 34.32 kg/m2 GENERAL:  No acute distress NECK:  No jugular venous distention, waveform within normal limits, carotid upstroke brisk and symmetric, no bruits, no thyromegaly LUNGS: Clear BACK:  No CVA tenderness CHEST:  Well healed sternotomy scar. HEART:  PMI not displaced or sustained,S1 and S2 within normal limits, no S3, no S4, no clicks, no rubs, no murmurs ABD:  Flat, positive bowel sounds normal in frequency in pitch, no bruits, no rebound, no guarding, no midline pulsatile  mass, no hepatomegaly, no splenomegaly EXT:  2 plus pulses throughout, moderate ankle edema, no cyanosis no clubbing   EKG:  Sinus rhythm, rate 74, axis within normal limits, intervals within normal limits, no acute ST T wave changes.  05/27/2013   ASSESSMENT AND PLAN  CAD -  He doesn't really describe any chest discomfort or other symptoms currently but he is very limited by his chronic problems including joint pain. It sounds like his only taking 60 mg of the Imdur and so I will continue this.  DYSPNEA ON EXERTION -  This is chronic. I suspect a fair amount of this is secondary to his weight and deconditioning.  At this point no change in therapy is indicated.

## 2013-06-07 ENCOUNTER — Other Ambulatory Visit: Payer: Self-pay | Admitting: Internal Medicine

## 2013-06-23 ENCOUNTER — Encounter: Payer: Self-pay | Admitting: Emergency Medicine

## 2013-06-23 ENCOUNTER — Ambulatory Visit (INDEPENDENT_AMBULATORY_CARE_PROVIDER_SITE_OTHER): Payer: Medicare Other | Admitting: Emergency Medicine

## 2013-06-23 VITALS — BP 136/84 | HR 98 | Temp 98.0°F | Resp 20 | Ht 71.0 in | Wt 267.0 lb

## 2013-06-23 DIAGNOSIS — I10 Essential (primary) hypertension: Secondary | ICD-10-CM

## 2013-06-23 DIAGNOSIS — E559 Vitamin D deficiency, unspecified: Secondary | ICD-10-CM

## 2013-06-23 DIAGNOSIS — E782 Mixed hyperlipidemia: Secondary | ICD-10-CM

## 2013-06-23 DIAGNOSIS — R7309 Other abnormal glucose: Secondary | ICD-10-CM

## 2013-06-23 DIAGNOSIS — E538 Deficiency of other specified B group vitamins: Secondary | ICD-10-CM

## 2013-06-23 DIAGNOSIS — R252 Cramp and spasm: Secondary | ICD-10-CM | POA: Diagnosis not present

## 2013-06-23 LAB — CBC WITH DIFFERENTIAL/PLATELET
Basophils Absolute: 0.1 10*3/uL (ref 0.0–0.1)
Basophils Relative: 1 % (ref 0–1)
EOS ABS: 0.2 10*3/uL (ref 0.0–0.7)
EOS PCT: 3 % (ref 0–5)
HEMATOCRIT: 41.1 % (ref 39.0–52.0)
HEMOGLOBIN: 14.1 g/dL (ref 13.0–17.0)
LYMPHS ABS: 4.2 10*3/uL — AB (ref 0.7–4.0)
LYMPHS PCT: 44 % (ref 12–46)
MCH: 29.4 pg (ref 26.0–34.0)
MCHC: 34.3 g/dL (ref 30.0–36.0)
MCV: 85.6 fL (ref 78.0–100.0)
MONOS PCT: 9 % (ref 3–12)
Monocytes Absolute: 0.8 10*3/uL (ref 0.1–1.0)
Neutro Abs: 4.2 10*3/uL (ref 1.7–7.7)
Neutrophils Relative %: 43 % (ref 43–77)
PLATELETS: 226 10*3/uL (ref 150–400)
RBC: 4.8 MIL/uL (ref 4.22–5.81)
RDW: 14.7 % (ref 11.5–15.5)
WBC: 9.5 10*3/uL (ref 4.0–10.5)

## 2013-06-23 LAB — LIPID PANEL
CHOL/HDL RATIO: 5 ratio
CHOLESTEROL: 213 mg/dL — AB (ref 0–200)
HDL: 43 mg/dL (ref >39)
LDL Cholesterol: 104 mg/dL — ABNORMAL HIGH (ref 0–99)
Triglycerides: 329 mg/dL — ABNORMAL HIGH (ref <150)
VLDL: 66 mg/dL — ABNORMAL HIGH (ref 0–40)

## 2013-06-23 LAB — BASIC METABOLIC PANEL WITH GFR
BUN: 19 mg/dL (ref 6–23)
CALCIUM: 8.9 mg/dL (ref 8.4–10.5)
CHLORIDE: 103 meq/L (ref 96–112)
CO2: 26 mEq/L (ref 19–32)
CREATININE: 0.83 mg/dL (ref 0.50–1.35)
GFR, EST NON AFRICAN AMERICAN: 80 mL/min
Glucose, Bld: 92 mg/dL (ref 70–99)
Potassium: 4 mEq/L (ref 3.5–5.3)
Sodium: 135 mEq/L (ref 135–145)

## 2013-06-23 LAB — HEPATIC FUNCTION PANEL
ALK PHOS: 73 U/L (ref 39–117)
ALT: 21 U/L (ref 0–53)
AST: 15 U/L (ref 0–37)
Albumin: 3.9 g/dL (ref 3.5–5.2)
BILIRUBIN DIRECT: 0.1 mg/dL (ref 0.0–0.3)
BILIRUBIN INDIRECT: 0.4 mg/dL (ref 0.2–1.2)
BILIRUBIN TOTAL: 0.5 mg/dL (ref 0.2–1.2)
Total Protein: 6.4 g/dL (ref 6.0–8.3)

## 2013-06-23 LAB — HEMOGLOBIN A1C
Hgb A1c MFr Bld: 5.7 % — ABNORMAL HIGH (ref <5.7)
Mean Plasma Glucose: 117 mg/dL — ABNORMAL HIGH (ref <117)

## 2013-06-23 LAB — TSH: TSH: 5.489 u[IU]/mL — ABNORMAL HIGH (ref 0.350–4.500)

## 2013-06-23 LAB — MAGNESIUM: Magnesium: 1.9 mg/dL (ref 1.5–2.5)

## 2013-06-23 NOTE — Patient Instructions (Signed)
Muscle Cramps and Spasms  Muscle cramps and spasms are when muscles tighten by themselves. They usually get better within minutes. Muscle cramps are painful. They are usually stronger and last longer than muscle spasms. Muscle spasms may or may not be painful. They can last a few seconds or much longer.  HOME CARE  · Drink enough fluid to keep your pee (urine) clear or pale yellow.  · Massage, stretch, and relax the muscle.  · Use a warm towel, heating pad, or warm shower water on tight muscles.  · Place ice on the muscle if it is tender or in pain.  · Put ice in a plastic bag.  · Place a towel between your skin and the bag.  · Leave the ice on for 15-20 minutes, 03-04 times a day.  · Only take medicine as told by your doctor.  GET HELP RIGHT AWAY IF:   Your cramps or spasms get worse, happen more often, or do not get better with time.  MAKE SURE YOU:  · Understand these instructions.  · Will watch your condition.  · Will get help right away if you are not doing well or get worse.  Document Released: 04/24/2008 Document Revised: 09/06/2012 Document Reviewed: 04/28/2012  [employer]® Patient Information ©2014 [employer].

## 2013-06-23 NOTE — Progress Notes (Signed)
Subjective:    Patient ID: [name], male    DOB: 1924/07/06, 80 y.o.   MRN: [id number]  HPI Comments: 80 yo male presents for 3 month F/U for HTN, Cholesterol, Pre-Dm, D. deficient LAST LABS BS 105 T 187 TG 248 H 38 L 99 A1C 5.7 INS 57 D 55 MAG 1.9 HE INCREASED VIT D AT LAST OV. He is exercising rarely. He is eating descent. He is checking BP occasionally and has been [place]. He has noticed increased cramping in all extremities. He does not drink enough water.  Hypertension   Current Outpatient Prescriptions on File Prior to Visit  Medication Sig Dispense Refill  . aspirin 81 MG tablet Take 81 mg by mouth daily.        . Coenzyme Q10 400 MG CAPS Take 1 capsule by mouth daily.      . cyclobenzaprine (FLEXERIL) 10 MG tablet TAKE ONE-HALF TO ONE TABLET BY MOUTH THREE TIMES DAILY AS NEEDED FOR MUSCLE SPASM  90 tablet  0  . enalapril (VASOTEC) 20 MG tablet Take 1 tablet (20 mg total) by mouth daily.  90 tablet  0  . fish oil-omega-3 fatty acids 1000 MG capsule Take 1 g by mouth daily.        . furosemide (LASIX) 40 MG tablet Take 40 mg by mouth daily.      . isosorbide mononitrate (IMDUR) 60 MG 24 hr tablet Take 60 mg by mouth daily. Takes 1.5 pills daily      . levothyroxine (SYNTHROID, LEVOTHROID) 200 MCG tablet Take 200 mcg by mouth daily.        Marland Kitchen MAGNESIUM PO Take 250 mg by mouth daily.       . Multiple Vitamin (MULTIVITAMIN) tablet Take 1 tablet by mouth daily.      . nitroGLYCERIN (NITROSTAT) 0.4 MG SL tablet Place 1 tablet (0.4 mg total) under the tongue every 5 (five) minutes as needed.  25 tablet  11   No current facility-administered medications on file prior to visit.   ALLERGIES Prednisone  Past Medical History  Diagnosis Date  . Hypothyroidism   . 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  Marland Kitchen Hernia   . Bone spur     and fused vertebrae.  He   is treated by Dr. Theda Sers.  Marland Kitchen CAD (coronary artery disease)     Cath March 2012. 95%  left main stenosis. Patent LIMA to the LAD, patent saphenous vein graft to OM, patent saphenous vein graft to the right coronary artery.  Marland Kitchen PVD (peripheral vascular disease)     (90% left iliac stenosis)  . Aortic ectasia       no abdominal aortic aneurysm.  He is followed for this by   Dr. Curt Jews.  . Constipated   . Hypertension   . COPD (chronic obstructive pulmonary disease)   . Hyperlipidemia   . Abdominal aortic aneurysm     3.3 cm  . Pre-diabetes   . Vitamin D deficiency       Review of Systems  Musculoskeletal: Positive for myalgias.  All other systems reviewed and are negative.   BP 136/84  Pulse 98  Temp(Src) 98 F (36.7 C) (Temporal)  Resp 20  Ht 5\' 11"  (1.803 m)  Wt 267 lb (121.11 kg)  BMI 37.26 kg/m2     Objective:   Physical Exam  Nursing note and vitals reviewed. Constitutional: He is oriented to  person, place, and time. He appears well-developed and well-nourished.  Obese  HENT:  Head: Normocephalic and atraumatic.  Right Ear: External ear normal.  Left Ear: External ear normal.  Nose: Nose normal.  Eyes: Conjunctivae and EOM are normal.  Neck: Normal range of motion. Neck supple. No JVD present. No thyromegaly present.  Cardiovascular: Normal rate, regular rhythm, normal heart sounds and intact distal pulses.   Pulmonary/Chest: Effort normal and breath sounds normal.  Chronic SOB with walking/ talking no change  Abdominal: Soft. Bowel sounds are normal. He exhibits no distension and no mass. There is no tenderness. There is no rebound and no guarding.  Musculoskeletal: Normal range of motion. He exhibits no edema and no tenderness.  Lymphadenopathy:    He has no cervical adenopathy.  Neurological: He is alert and oriented to person, place, and time. He has normal reflexes. No cranial nerve deficit. Coordination normal.  Skin: Skin is warm and dry.  Psychiatric: He has a normal mood and affect. His behavior is normal. Judgment and thought  content normal.          Assessment & Plan:  1.  3 month F/U for HTN, Cholesterol, Pre-Dm, D. Deficient. Needs healthy diet, cardio QD and obtain healthy weight. Check Labs, Check BP if >130/80 call office 2. B12 def ? vs cramping- Check labs

## 2013-06-24 LAB — INSULIN, FASTING: Insulin fasting, serum: 40 u[IU]/mL — ABNORMAL HIGH (ref 3–28)

## 2013-07-13 DIAGNOSIS — M5137 Other intervertebral disc degeneration, lumbosacral region: Secondary | ICD-10-CM | POA: Diagnosis not present

## 2013-07-13 DIAGNOSIS — M48061 Spinal stenosis, lumbar region without neurogenic claudication: Secondary | ICD-10-CM | POA: Diagnosis not present

## 2013-07-27 ENCOUNTER — Ambulatory Visit: Payer: Self-pay

## 2013-07-28 ENCOUNTER — Ambulatory Visit (INDEPENDENT_AMBULATORY_CARE_PROVIDER_SITE_OTHER): Payer: Medicare Other | Admitting: *Deleted

## 2013-07-28 DIAGNOSIS — E039 Hypothyroidism, unspecified: Secondary | ICD-10-CM

## 2013-07-28 NOTE — Progress Notes (Signed)
Patient ID: [name], male   DOB: [birth year], 80 y.o.   MRN: [id number] Patient presents for 1 month recheck TSH levels.  Patient was advised at last office visit to take Levothyroxine 200 mcg 1 daily.  Patient states he "thinks' he takes 8 pills per week, 1 daily and 1 1/2 two days per week, not sure which days, and only when he "gets around" to taking his Levothyroxine Rx.

## 2013-07-29 LAB — TSH: TSH: 1.409 u[IU]/mL (ref 0.350–4.500)

## 2013-09-20 ENCOUNTER — Encounter: Payer: Self-pay | Admitting: Internal Medicine

## 2013-10-27 ENCOUNTER — Ambulatory Visit (INDEPENDENT_AMBULATORY_CARE_PROVIDER_SITE_OTHER): Payer: Medicare Other | Admitting: Internal Medicine

## 2013-10-27 ENCOUNTER — Encounter: Payer: Self-pay | Admitting: Internal Medicine

## 2013-10-27 VITALS — BP 124/82 | HR 68 | Temp 98.1°F | Resp 18 | Ht 71.25 in | Wt 263.6 lb

## 2013-10-27 DIAGNOSIS — Z789 Other specified health status: Secondary | ICD-10-CM

## 2013-10-27 DIAGNOSIS — Z23 Encounter for immunization: Secondary | ICD-10-CM | POA: Diagnosis not present

## 2013-10-27 DIAGNOSIS — I10 Essential (primary) hypertension: Secondary | ICD-10-CM | POA: Diagnosis not present

## 2013-10-27 DIAGNOSIS — Z1212 Encounter for screening for malignant neoplasm of rectum: Secondary | ICD-10-CM

## 2013-10-27 DIAGNOSIS — Z79899 Other long term (current) drug therapy: Secondary | ICD-10-CM | POA: Insufficient documentation

## 2013-10-27 DIAGNOSIS — E782 Mixed hyperlipidemia: Secondary | ICD-10-CM | POA: Diagnosis not present

## 2013-10-27 DIAGNOSIS — R7309 Other abnormal glucose: Secondary | ICD-10-CM | POA: Insufficient documentation

## 2013-10-27 DIAGNOSIS — J449 Chronic obstructive pulmonary disease, unspecified: Secondary | ICD-10-CM

## 2013-10-27 DIAGNOSIS — Z125 Encounter for screening for malignant neoplasm of prostate: Secondary | ICD-10-CM | POA: Diagnosis not present

## 2013-10-27 DIAGNOSIS — E559 Vitamin D deficiency, unspecified: Secondary | ICD-10-CM | POA: Insufficient documentation

## 2013-10-27 DIAGNOSIS — Z1331 Encounter for screening for depression: Secondary | ICD-10-CM

## 2013-10-27 LAB — CBC WITH DIFFERENTIAL/PLATELET
Basophils Absolute: 0.1 10*3/uL (ref 0.0–0.1)
Basophils Relative: 1 % (ref 0–1)
Eosinophils Absolute: 0.2 10*3/uL (ref 0.0–0.7)
Eosinophils Relative: 3 % (ref 0–5)
HCT: 37.2 % — ABNORMAL LOW (ref 39.0–52.0)
HEMOGLOBIN: 12.4 g/dL — AB (ref 13.0–17.0)
LYMPHS ABS: 3.2 10*3/uL (ref 0.7–4.0)
LYMPHS PCT: 46 % (ref 12–46)
MCH: 28.8 pg (ref 26.0–34.0)
MCHC: 33.3 g/dL (ref 30.0–36.0)
MCV: 86.3 fL (ref 78.0–100.0)
MONOS PCT: 10 % (ref 3–12)
Monocytes Absolute: 0.7 10*3/uL (ref 0.1–1.0)
NEUTROS PCT: 40 % — AB (ref 43–77)
Neutro Abs: 2.8 10*3/uL (ref 1.7–7.7)
PLATELETS: 199 10*3/uL (ref 150–400)
RBC: 4.31 MIL/uL (ref 4.22–5.81)
RDW: 14.2 % (ref 11.5–15.5)
WBC: 6.9 10*3/uL (ref 4.0–10.5)

## 2013-10-27 LAB — HEMOGLOBIN A1C
Hgb A1c MFr Bld: 5.5 % (ref <5.7)
Mean Plasma Glucose: 111 mg/dL (ref <117)

## 2013-10-27 NOTE — Patient Instructions (Signed)

## 2013-10-27 NOTE — Progress Notes (Signed)
Patient ID: [name], male   DOB: Oct 12, 1924, 80 y.o.   MRN: [id number]   Annual Screening Comprehensive Examination  This very nice 80 y.o.MWM presents for complete physical.  Patient has been followed for HTN, ASCAD/CABG, ASPVD, severe COPD,  hypothyroidism,  Prediabetes, Hyperlipidemia, and Vitamin D Deficiency.   HTN predates since 2005. Patient's BP has been controlled at home.Today's BP: 124/82 mmHg.Patient had CABG in 2010. In Mar 2012, Ht cath showed patent grafts. Patient denies any cardiac symptoms as chest pain, palpitations, shortness of breath, dizziness or ankle swelling.   Patient's hyperlipidemia is near controlled with diet and medications. Patient denies myalgias or other medication SE's. Last Lipids as below.   Lab Results  Component Value Date   CHOL 213* 06/23/2013   HDL 43 06/23/2013   LDLCALC 104* 06/23/2013   TRIG 329* 06/23/2013   CHOLHDL 5.0 06/23/2013    Patient has moderately severe O2 Dependent COPD And consequent DOE alleging smoking cessation since 2004. He denies any cough or sputum production but does become moderately dyspneic during speech and Room Air O2 sat measured 86% at rest.   Patient has Morbid Obesity  and consequent prediabetes with A1c 5.9% since Mar 2011 and last A1c was 5.7% in Jan 2015. Patient denies reactive hypoglycemic symptoms, visual blurring, diabetic polys or paresthesias.    Finally, patient has history of Vitamin D Deficiency of 28 in July 2009 and last vitamin D was 55 in Oct 2014.  Medication Sig  . aspirin 81 MG tablet Take 81 mg by mouth daily.    Marland Kitchen VITAMIN D 5000 U Take by mouth daily.   . Coenzyme Q10 400 MG CAPS Take 1 capsule by mouth daily.  . cyclobenzaprine (FLEXERIL) 10 MG tablet TAKE ONE-HALF TO ONE TABLET BY MOUTH THREE TIMES DAILY   . enalapril  20 MG tablet Take 1 tablet (20 mg total) by mouth daily.  . fish oil1000 MG cap Take 1 g by mouth daily.    . furosemide (LASIX) 40 MG tablet Take 40 mg by mouth daily.  .  IMDUR 60 MG 24 hr  Take 60 mg by mouth daily. Takes 1.5 pills daily  . levothyroxine  200 MCG tablet Take 200 mcg by mouth daily.    Marland Kitchen MAGNESIUM PO Take 250 mg by mouth daily.   . MULTIVITAMIN Take 1 tablet by mouth daily.  Marland Kitchen NITROSTAT 0.4 MG SL tabl  1 tablet under the tongue as needed.   Allergies  Allergen Reactions  . Prednisone    Past Medical History  Diagnosis Date  . Hypothyroidism   . 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  Marland Kitchen Hernia   . Bone spur     and fused vertebrae.  He   is treated by Dr. Theda Sers.  Marland Kitchen CAD (coronary artery disease)     Cath March 2012. 95% left main stenosis. Patent LIMA to the LAD, patent saphenous vein graft to OM, patent saphenous vein graft to the right coronary artery.  Marland Kitchen PVD (peripheral vascular disease)     (90% left iliac stenosis)  . Aortic ectasia       no abdominal aortic aneurysm.  He is followed for this by   Dr. Curt Jews.  . Constipated   . Hypertension   . COPD (chronic obstructive pulmonary disease)   . Hyperlipidemia   . Abdominal aortic aneurysm     3.3 cm  . Pre-diabetes   .  Vitamin D deficiency    Past Surgical History  Procedure Laterality Date  . Pneumonectomy    . Knee surgery    . Cataract extraction    . Pilonidal cyst / sinus excision    . Coronary artery bypass graft       2010 Median sternotomy, extracorporeal circulation,   coronary artery bypass graft surgery x3 using a left internal mammary   artery graft to left anterior descending coronary artery, with a    saphenous vein graft to the intermediate coronary artery, and a   saphenous vein graft to the right coronary artery.  Endoscopic vein   harvesting from the left leg.    Family History  Problem Relation Age of Onset  . Heart disease Mother   . Pneumonia Father    History   Social History  . Marital Status: Married    Spouse Name: N/A    Number of Children: N/A  . Years of Education: N/A   Occupational  History  . Retired Diplomatic Services operational officer   Social History Main Topics  . Smoking status: Former Smoker    Quit date: 06/23/2002  . Smokeless tobacco: Not on file  . Alcohol Use: No  . Drug Use: No  . Sexual Activity: No   Social History Narrative   [name] has been married for the last 51 years.  He has one    daughter.  He is a retired Corporate treasurer.  He quit smoking    cigarettes July 2004.  Previous to this, he did    smoke a pack a day for 60 years.  Did not drink alcohol.  He lives with his wife in a multilevel dwelling.  He drives.   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    ROS Constitutional: Denies fever, chills, weight loss/gain, headaches, insomnia, fatigue, night sweats or change in appetite. Eyes: Denies redness, blurred vision, diplopia, discharge, itchy or watery eyes.  ENT: Denies discharge, congestion, post nasal drip, epistaxis, sore throat, earache, hearing loss, dental pain, Tinnitus, Vertigo, Sinus pain or snoring.  Cardio: Denies chest pain, palpitations, irregular heartbeat, syncope, dyspnea, diaphoresis, orthopnea, PND, claudication or edema Respiratory: denies cough, dyspnea, DOE, pleurisy, hoarseness, laryngitis or wheezing.  Gastrointestinal: Denies dysphagia, heartburn, reflux, water brash, pain, cramps, nausea, vomiting, bloating, diarrhea, constipation, hematemesis, melena, hematochezia, jaundice or hemorrhoids Genitourinary: Denies dysuria, frequency, urgency, nocturia, hesitancy, discharge, hematuria or flank pain Musculoskeletal: Denies arthralgia, myalgia, stiffness, Jt. Swelling, pain, limp or strain/sprain.Occas nosturnal leg cramps. Skin: Denies puritis, rash, hives, warts, acne, eczema or change in skin lesion Neuro: No weakness, tremor, incoordination, spasms, paresthesia or pain Psychiatric: Denies confusion, memory loss or sensory loss Endocrine: Denies change in weight, skin, hair change, nocturia, and paresthesia, diabetic polys, visual  blurring or hyper / hypo glycemic episodes.  Heme/Lymph: No excessive bleeding, bruising or enlarged lymph nodes.  Physical Exam  BP 124/82  P 68  T 98.1 F  R 18  Ht 5' 11.25"   Wt 263 lb 9.6 oz   BMI 36.50 kg/m2  General Appearance: Well nourished, in no apparent distress. Eyes: PERRLA, EOMs, conjunctiva no swelling or erythema, normal fundi and vessels. Sinuses: No frontal/maxillary tenderness ENT/Mouth: EACs patent / TMs  nl. Nares clear without erythema, swelling, mucoid exudates. Oral hygiene is good. No erythema, swelling, or exudate. Tongue normal, non-obstructing. Tonsils not swollen or erythematous. Hearing normal.  Neck: Supple, thyroid normal. No bruits, nodes or JVD. Respiratory: Respiratory effort normal.  BS decreased, equal  and clear bilateral without rales, rhonci, wheezing or stridor. Cardio: Heart sounds are decreased with regular rate and rhythm and no murmurs, rubs or gallops. Peripheral pulses are normal and equal bilaterally with 1-2 (+) assym  Edema R>L. No aortic or femoral bruits. Chest: Barrel configured. Symmetric.  Abdomen: Obese soft, with bowl sounds. Nontender, no guarding, rebound, hernias, masses, or organomegaly.  Lymphatics: Non tender without lymphadenopathy.  Genitourinary: No hernias.Testes nl. DRE - prostate nl for age - smooth & firm w/o nodules. Musculoskeletal: Generalized decrease in muscle power, tone and bulk. Full ROM all peripheral extremities, joint stability, 5/5 strength, and walks with a cane. Skin: Warm and dry without rashes, lesions, cyanosis, clubbing or  ecchymosis.  Neuro: Cranial nerves intact, reflexes equal bilaterally. Normal muscle tone, no cerebellar symptoms. Sensation intact.  Pysch: Awake and oriented X 3, normal affect, insight and judgment appropriate.   Assessment and Plan  1. Annual Screening Examination 2. Hypertension  3. Hyperlipidemia 4. Pre Diabetes 5. Vitamin D Deficiency 6. ASCAD/CABG(2010) 7. ASPVD 8.  COPD   Continue prudent diet as discussed, weight control, BP monitoring, regular exercise, and medications as discussed.  Discussed med effects and SE's. Routine screening labs and tests as requested with regular follow-up as recommended.

## 2013-10-28 LAB — BASIC METABOLIC PANEL WITH GFR
BUN: 17 mg/dL (ref 6–23)
CHLORIDE: 108 meq/L (ref 96–112)
CO2: 21 meq/L (ref 19–32)
Calcium: 8.9 mg/dL (ref 8.4–10.5)
Creat: 0.86 mg/dL (ref 0.50–1.35)
GFR, Est African American: >89 mL/min
GFR, Est Non African American: 78 mL/min
GLUCOSE: 91 mg/dL (ref 70–99)
POTASSIUM: 4.3 meq/L (ref 3.5–5.3)
SODIUM: 138 meq/L (ref 135–145)

## 2013-10-28 LAB — MICROALBUMIN / CREATININE URINE RATIO
CREATININE, URINE: 147.5 mg/dL
MICROALB/CREAT RATIO: 3.4 mg/g (ref 0.0–30.0)
Microalb, Ur: 0.5 mg/dL (ref 0.00–1.89)

## 2013-10-28 LAB — URINALYSIS, MICROSCOPIC ONLY
Bacteria, UA: NONE SEEN
CRYSTALS: NONE SEEN
Casts: NONE SEEN
SQUAMOUS EPITHELIAL / LPF: NONE SEEN

## 2013-10-28 LAB — LIPID PANEL
CHOL/HDL RATIO: 4.4 ratio
Cholesterol: 175 mg/dL (ref 0–200)
HDL: 40 mg/dL (ref >39)
LDL CALC: 88 mg/dL (ref 0–99)
Triglycerides: 233 mg/dL — ABNORMAL HIGH (ref <150)
VLDL: 47 mg/dL — AB (ref 0–40)

## 2013-10-28 LAB — HEPATIC FUNCTION PANEL
ALK PHOS: 65 U/L (ref 39–117)
ALT: 17 U/L (ref 0–53)
AST: 14 U/L (ref 0–37)
Albumin: 3.8 g/dL (ref 3.5–5.2)
BILIRUBIN DIRECT: 0.1 mg/dL (ref 0.0–0.3)
BILIRUBIN INDIRECT: 0.4 mg/dL (ref 0.2–1.2)
BILIRUBIN TOTAL: 0.5 mg/dL (ref 0.2–1.2)
Total Protein: 6 g/dL (ref 6.0–8.3)

## 2013-10-28 LAB — INSULIN, FASTING: Insulin fasting, serum: 57 u[IU]/mL — ABNORMAL HIGH (ref 3–28)

## 2013-10-28 LAB — PSA: PSA: 0.9 ng/mL (ref <=4.00)

## 2013-10-28 LAB — VITAMIN D 25 HYDROXY (VIT D DEFICIENCY, FRACTURES): VIT D 25 HYDROXY: 51 ng/mL (ref 30–89)

## 2013-10-28 LAB — TSH: TSH: 0.197 u[IU]/mL — AB (ref 0.350–4.500)

## 2013-10-28 LAB — MAGNESIUM: MAGNESIUM: 2 mg/dL (ref 1.5–2.5)

## 2013-10-31 ENCOUNTER — Ambulatory Visit (HOSPITAL_COMMUNITY)
Admission: RE | Admit: 2013-10-31 | Discharge: 2013-10-31 | Disposition: A | Discharge status: Home / Self Care | Payer: Medicare Other | Source: Ambulatory Visit | Attending: Internal Medicine | Admitting: Internal Medicine

## 2013-10-31 DIAGNOSIS — J984 Other disorders of lung: Secondary | ICD-10-CM | POA: Insufficient documentation

## 2013-10-31 DIAGNOSIS — J4489 Other specified chronic obstructive pulmonary disease: Secondary | ICD-10-CM | POA: Insufficient documentation

## 2013-10-31 DIAGNOSIS — M549 Dorsalgia, unspecified: Secondary | ICD-10-CM | POA: Diagnosis not present

## 2013-10-31 DIAGNOSIS — J9819 Other pulmonary collapse: Secondary | ICD-10-CM | POA: Insufficient documentation

## 2013-10-31 DIAGNOSIS — Z85118 Personal history of other malignant neoplasm of bronchus and lung: Secondary | ICD-10-CM | POA: Diagnosis not present

## 2013-10-31 DIAGNOSIS — J449 Chronic obstructive pulmonary disease, unspecified: Secondary | ICD-10-CM | POA: Diagnosis not present

## 2013-11-02 DIAGNOSIS — M5137 Other intervertebral disc degeneration, lumbosacral region: Secondary | ICD-10-CM | POA: Diagnosis not present

## 2013-11-02 DIAGNOSIS — G894 Chronic pain syndrome: Secondary | ICD-10-CM | POA: Diagnosis not present

## 2013-11-24 ENCOUNTER — Other Ambulatory Visit (INDEPENDENT_AMBULATORY_CARE_PROVIDER_SITE_OTHER): Payer: Medicare Other

## 2013-11-24 DIAGNOSIS — Z1212 Encounter for screening for malignant neoplasm of rectum: Secondary | ICD-10-CM

## 2013-11-24 LAB — POC HEMOCCULT BLD/STL (HOME/3-CARD/SCREEN)
Card #3 Fecal Occult Blood, POC: NEGATIVE
FECAL OCCULT BLD: NEGATIVE
Fecal Occult Blood, POC: NEGATIVE

## 2013-12-12 ENCOUNTER — Other Ambulatory Visit: Payer: Self-pay | Admitting: Cardiology

## 2014-01-09 ENCOUNTER — Encounter: Payer: Self-pay | Admitting: Physician Assistant

## 2014-01-09 ENCOUNTER — Ambulatory Visit (INDEPENDENT_AMBULATORY_CARE_PROVIDER_SITE_OTHER): Payer: Medicare Other | Admitting: Physician Assistant

## 2014-01-09 VITALS — BP 156/70 | HR 68 | Temp 98.0°F | Resp 20 | Ht 71.0 in | Wt 260.0 lb

## 2014-01-09 DIAGNOSIS — B351 Tinea unguium: Secondary | ICD-10-CM

## 2014-01-09 DIAGNOSIS — T300 Burn of unspecified body region, unspecified degree: Secondary | ICD-10-CM

## 2014-01-09 MED ORDER — TRIAMCINOLONE ACETONIDE 0.1 % EX CREA: 1.0000 "application " | TOPICAL_CREAM | Freq: Two times a day (BID) | CUTANEOUS | Status: DC

## 2014-01-09 MED ORDER — HYDROCODONE-ACETAMINOPHEN 5-325 MG PO TABS: 1.0000 | ORAL_TABLET | Freq: Four times a day (QID) | ORAL | Status: DC | PRN
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
## 2014-01-09 NOTE — Patient Instructions (Signed)
Burn Care Your skin is a natural barrier to infection. It is the largest organ of your body. Burns damage this natural protection. To help prevent infection, it is very important to follow your caregiver's instructions in the care of your burn. Burns are classified as:  First degree. There is only redness of the skin (erythema). No scarring is expected.  Second degree. There is blistering of the skin. Scarring may occur with deeper burns.  Third degree. All layers of the skin are injured, and scarring is expected. HOME CARE INSTRUCTIONS   Wash your hands well before changing your bandage.  Change your bandage as often as directed by your caregiver.  Remove the old bandage. If the bandage sticks, you may soak it off with cool, clean water.  Cleanse the burn thoroughly but gently with mild soap and water.  Pat the area dry with a clean, dry cloth.  Apply a thin layer of antibacterial cream to the burn.  Apply a clean bandage as instructed by your caregiver.  Keep the bandage as clean and dry as possible.  Elevate the affected area for the first 24 hours, then as instructed by your caregiver.  Only take over-the-counter or prescription medicines for pain, discomfort, or fever as directed by your caregiver. SEEK IMMEDIATE MEDICAL CARE IF:   You develop excessive pain.  You develop redness, tenderness, swelling, or red streaks near the burn.  The burned area develops yellowish-white fluid (pus) or a bad smell.  You have a fever. MAKE SURE YOU:   Understand these instructions.  Will watch your condition.  Will get help right away if you are not doing well or get worse. Document Released: 05/12/2005 Document Revised: 08/04/2011 Document Reviewed: 10/02/2010 Exit[employer] Patient Information 2015 [employer]. This information is not intended to replace advice given to you by your health care provider. Make sure you discuss any questions you have with your health care  provider.  

## 2014-01-10 LAB — CBC WITH DIFFERENTIAL/PLATELET
BASOS ABS: 0.1 10*3/uL (ref 0.0–0.1)
Basophils Relative: 1 % (ref 0–1)
EOS ABS: 0.2 10*3/uL (ref 0.0–0.7)
EOS PCT: 2 % (ref 0–5)
HCT: 38.3 % — ABNORMAL LOW (ref 39.0–52.0)
Hemoglobin: 13.2 g/dL (ref 13.0–17.0)
LYMPHS PCT: 44 % (ref 12–46)
Lymphs Abs: 3.7 10*3/uL (ref 0.7–4.0)
MCH: 28.7 pg (ref 26.0–34.0)
MCHC: 34.5 g/dL (ref 30.0–36.0)
MCV: 83.3 fL (ref 78.0–100.0)
Monocytes Absolute: 0.8 10*3/uL (ref 0.1–1.0)
Monocytes Relative: 9 % (ref 3–12)
Neutro Abs: 3.7 10*3/uL (ref 1.7–7.7)
Neutrophils Relative %: 44 % (ref 43–77)
PLATELETS: 215 10*3/uL (ref 150–400)
RBC: 4.6 MIL/uL (ref 4.22–5.81)
RDW: 14.5 % (ref 11.5–15.5)
WBC: 8.5 10*3/uL (ref 4.0–10.5)

## 2014-01-16 ENCOUNTER — Ambulatory Visit (INDEPENDENT_AMBULATORY_CARE_PROVIDER_SITE_OTHER): Payer: Medicare Other | Admitting: Podiatry

## 2014-01-16 ENCOUNTER — Encounter: Payer: Self-pay | Admitting: Podiatry

## 2014-01-16 VITALS — BP 160/92 | HR 78 | Resp 12

## 2014-01-16 DIAGNOSIS — B351 Tinea unguium: Secondary | ICD-10-CM | POA: Diagnosis not present

## 2014-01-16 DIAGNOSIS — M79609 Pain in unspecified limb: Secondary | ICD-10-CM

## 2014-01-16 DIAGNOSIS — T300 Burn of unspecified body region, unspecified degree: Secondary | ICD-10-CM

## 2014-01-16 DIAGNOSIS — I2510 Atherosclerotic heart disease of native coronary artery without angina pectoris: Secondary | ICD-10-CM | POA: Diagnosis not present

## 2014-01-16 DIAGNOSIS — M79676 Pain in unspecified toe(s): Secondary | ICD-10-CM

## 2014-01-16 NOTE — Patient Instructions (Signed)
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
## 2014-02-01 ENCOUNTER — Encounter: Payer: Self-pay | Admitting: Physician Assistant

## 2014-02-01 ENCOUNTER — Ambulatory Visit (INDEPENDENT_AMBULATORY_CARE_PROVIDER_SITE_OTHER): Payer: Medicare Other | Admitting: Physician Assistant

## 2014-02-01 VITALS — BP 132/78 | HR 60 | Temp 98.1°F | Resp 16 | Ht 71.5 in | Wt 260.0 lb

## 2014-02-01 DIAGNOSIS — E559 Vitamin D deficiency, unspecified: Secondary | ICD-10-CM

## 2014-02-01 DIAGNOSIS — I10 Essential (primary) hypertension: Secondary | ICD-10-CM

## 2014-02-01 DIAGNOSIS — R7309 Other abnormal glucose: Secondary | ICD-10-CM

## 2014-02-01 DIAGNOSIS — E039 Hypothyroidism, unspecified: Secondary | ICD-10-CM

## 2014-02-01 DIAGNOSIS — Z79899 Other long term (current) drug therapy: Secondary | ICD-10-CM | POA: Diagnosis not present

## 2014-02-01 DIAGNOSIS — E782 Mixed hyperlipidemia: Secondary | ICD-10-CM

## 2014-02-01 LAB — CBC WITH DIFFERENTIAL/PLATELET
BASOS ABS: 0.1 10*3/uL (ref 0.0–0.1)
BASOS PCT: 1 % (ref 0–1)
Eosinophils Absolute: 0.3 10*3/uL (ref 0.0–0.7)
Eosinophils Relative: 3 % (ref 0–5)
HCT: 36.7 % — ABNORMAL LOW (ref 39.0–52.0)
HEMOGLOBIN: 12.5 g/dL — AB (ref 13.0–17.0)
Lymphocytes Relative: 50 % — ABNORMAL HIGH (ref 12–46)
Lymphs Abs: 4.2 10*3/uL — ABNORMAL HIGH (ref 0.7–4.0)
MCH: 28.7 pg (ref 26.0–34.0)
MCHC: 34.1 g/dL (ref 30.0–36.0)
MCV: 84.2 fL (ref 78.0–100.0)
Monocytes Absolute: 0.8 10*3/uL (ref 0.1–1.0)
Monocytes Relative: 9 % (ref 3–12)
NEUTROS ABS: 3.1 10*3/uL (ref 1.7–7.7)
NEUTROS PCT: 37 % — AB (ref 43–77)
Platelets: 211 10*3/uL (ref 150–400)
RBC: 4.36 MIL/uL (ref 4.22–5.81)
RDW: 14.9 % (ref 11.5–15.5)
WBC: 8.4 10*3/uL (ref 4.0–10.5)

## 2014-02-01 LAB — HEPATIC FUNCTION PANEL
ALT: 16 U/L (ref 0–53)
AST: 15 U/L (ref 0–37)
Albumin: 3.9 g/dL (ref 3.5–5.2)
Alkaline Phosphatase: 79 U/L (ref 39–117)
BILIRUBIN DIRECT: 0.1 mg/dL (ref 0.0–0.3)
BILIRUBIN INDIRECT: 0.4 mg/dL (ref 0.2–1.2)
TOTAL PROTEIN: 6.5 g/dL (ref 6.0–8.3)
Total Bilirubin: 0.5 mg/dL (ref 0.2–1.2)

## 2014-02-01 LAB — BASIC METABOLIC PANEL WITH GFR
BUN: 17 mg/dL (ref 6–23)
CO2: 24 mEq/L (ref 19–32)
Calcium: 9 mg/dL (ref 8.4–10.5)
Chloride: 107 mEq/L (ref 96–112)
Creat: 0.9 mg/dL (ref 0.50–1.35)
GFR, EST AFRICAN AMERICAN: 88 mL/min
GFR, EST NON AFRICAN AMERICAN: 77 mL/min
Glucose, Bld: 95 mg/dL (ref 70–99)
POTASSIUM: 4.8 meq/L (ref 3.5–5.3)
SODIUM: 140 meq/L (ref 135–145)

## 2014-02-01 LAB — HEMOGLOBIN A1C
Hgb A1c MFr Bld: 5.8 % — ABNORMAL HIGH (ref <5.7)
Mean Plasma Glucose: 120 mg/dL — ABNORMAL HIGH (ref <117)

## 2014-02-01 LAB — LIPID PANEL
Cholesterol: 187 mg/dL (ref 0–200)
HDL: 42 mg/dL (ref >39)
LDL CALC: 102 mg/dL — AB (ref 0–99)
TRIGLYCERIDES: 213 mg/dL — AB (ref <150)
Total CHOL/HDL Ratio: 4.5 Ratio
VLDL: 43 mg/dL — AB (ref 0–40)

## 2014-02-01 LAB — MAGNESIUM: Magnesium: 2 mg/dL (ref 1.5–2.5)

## 2014-02-01 NOTE — Progress Notes (Signed)
Assessment and Plan:  Hypertension: Continue medication, monitor blood pressure at home. Continue DASH diet. Cholesterol: Continue diet and exercise. Check cholesterol.  Pre-diabetes-Continue diet and exercise. Check A1C Vitamin D Def- check level and continue medications.  Angina- continue NTG/imdur, suggest follow up with cardio. If worsening CP go to ER.  Hypothyroidism-check TSH level.    Continue diet and meds as discussed. Further disposition pending results of labs.  HPI 80 y.o. male  presents for 3 month follow up with hypertension, hyperlipidemia, prediabetes and vitamin D. His blood pressure has been controlled at home, today their BP is BP: 132/78 mmHg He does not workout, but states that he walks a lot and is active. He denies chest pain, shortness of breath, dizziness.  He is not on cholesterol medication and denies myalgias. His cholesterol is not at goal. The cholesterol last visit was:   Lab Results  Component Value Date   CHOL 175 10/27/2013   HDL 40 10/27/2013   LDLCALC 88 10/27/2013   TRIG 233* 10/27/2013   CHOLHDL 4.4 10/27/2013   He has been working on diet and exercise for prediabetes, and denies polydipsia, polyuria and visual disturbances. Last A1C in the office was:  Lab Results  Component Value Date   HGBA1C 5.5 10/27/2013   Patient is on Vitamin D supplement.   Lab Results  Component Value Date   VD25OH 51 10/27/2013     He is on thyroid medication. His medication was not changed last visit, he takes one every morning. Patient denies nervousness, palpitations and weight changes.  Lab Results  Component Value Date   TSH 0.197* 10/27/2013  .  He has angina and is on Imdur, he takes NTG once every few months. Last time he took the NTG was 1 month ago, he states he was getting out of bed in the morning and had some chest pressure, pain in his jaw, arm, sweaty but denies SOB, dizziness, he took 1 NTG and it got better.   Current Medications:  Current Outpatient  Prescriptions on File Prior to Visit  Medication Sig Dispense Refill  . aspirin 81 MG tablet Take 81 mg by mouth daily.        . Cholecalciferol (VITAMIN D-3) 5000 UNITS TABS Take by mouth daily.       . Coenzyme Q10 400 MG CAPS Take 1 capsule by mouth daily.      . cyclobenzaprine (FLEXERIL) 10 MG tablet TAKE ONE-HALF TO ONE TABLET BY MOUTH THREE TIMES DAILY AS NEEDED FOR MUSCLE SPASM  90 tablet  0  . enalapril (VASOTEC) 20 MG tablet Take 1 tablet (20 mg total) by mouth daily.  90 tablet  0  . fish oil-omega-3 fatty acids 1000 MG capsule Take 1 g by mouth daily.        . furosemide (LASIX) 40 MG tablet Take 40 mg by mouth daily.      Marland Kitchen HYDROcodone-acetaminophen (NORCO) 5-325 MG per tablet Take 1 tablet by mouth every 6 (six) hours as needed for moderate pain.  30 tablet  0  . isosorbide mononitrate (IMDUR) 60 MG 24 hr tablet TAKE ONE AND ONE-HALF TABLETS BY MOUTH ONCE DAILY  45 tablet  10  . levothyroxine (SYNTHROID, LEVOTHROID) 200 MCG tablet Take 200 mcg by mouth daily.        Marland Kitchen MAGNESIUM PO Take 250 mg by mouth daily.       . Multiple Vitamin (MULTIVITAMIN) tablet Take 1 tablet by mouth daily.      Marland Kitchen  nitroGLYCERIN (NITROSTAT) 0.4 MG SL tablet Place 1 tablet (0.4 mg total) under the tongue every 5 (five) minutes as needed.  25 tablet  11  . triamcinolone cream (KENALOG) 0.1 % Apply 1 application topically 2 (two) times daily.  85.2 g  2   No current facility-administered medications on file prior to visit.   Medical History:  Past Medical History  Diagnosis Date  . Hypothyroidism   . 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  Marland Kitchen Hernia   . Bone spur     and fused vertebrae.  He   is treated by Dr. Theda Sers.  Marland Kitchen CAD (coronary artery disease)     Cath March 2012. 95% left main stenosis. Patent LIMA to the LAD, patent saphenous vein graft to OM, patent saphenous vein graft to the right coronary artery.  Marland Kitchen PVD (peripheral vascular disease)     (90%  left iliac stenosis)  . Aortic ectasia       no abdominal aortic aneurysm.  He is followed for this by   Dr. Curt Jews.  . Constipated   . Hypertension   . COPD (chronic obstructive pulmonary disease)   . Hyperlipidemia   . Abdominal aortic aneurysm     3.3 cm  . Pre-diabetes   . Vitamin D deficiency    Allergies:  Allergies  Allergen Reactions  . Prednisone      Review of Systems: [X]  = complains of  [ ]  = denies  General: Fatigue [ ]  Fever [ ]  Chills [ ]  Weakness [ ]   Insomnia [ ]  Eyes: Redness [ ]  Blurred vision [ ]  Diplopia [ ]   ENT: Congestion [ ]  Sinus Pain [ ]  Post Nasal Drip [ ]  Sore Throat [ ]  Earache [ ]   Cardiac: Chest pain/pressure [ ]  SOB [ ]  Orthopnea [ ]   Palpitations [ ]   Paroxysmal nocturnal dyspnea[ ]  Claudication [ ]  Edema [ ]   Pulmonary: Cough [ ]  Wheezing[ ]   SOB [ ]   Snoring [ ]   GI: Nausea [ ]  Vomiting[ ]  Dysphagia[ ]  Heartburn[ ]  Abdominal pain [ ]  Constipation [ ] ; Diarrhea [ ] ; BRBPR [ ]  Melena[ ]  GU: Hematuria[ ]  Dysuria [ ]  Nocturia[ ]  Urgency [ ]   Hesitancy [ ]  Discharge [ ]  Neuro: Headaches[ ]  Vertigo[ ]  Paresthesias[ ]  Spasm [ ]  Speech changes [ ]  Incoordination [ ]   Ortho: Arthritis [ ]  Joint pain [ ]  Muscle pain [ ]  Joint swelling [ ]  Back Pain [ ]  Skin:  Rash [ ]   Pruritis [ ]  Change in skin lesion [ ]   Psych: Depression[ ]  Anxiety[ ]  Confusion [ ]  Memory loss [ ]   Heme/Lypmh: Bleeding [ ]  Bruising [ ]  Enlarged lymph nodes [ ]   Endocrine: Visual blurring [ ]  Paresthesia [ ]  Polyuria [ ]  Polydypsea [ ]    Heat/cold intolerance [ ]  Hypoglycemia [ ]   Family history- Review and unchanged Social history- Review and unchanged Physical Exam: BP 132/78  Pulse 60  Temp(Src) 98.1 F (36.7 C)  Resp 16  Ht 5' 11.5" (1.816 m)  Wt 260 lb (117.935 kg)  BMI 35.76 kg/m2 Wt Readings from Last 3 Encounters:  02/01/14 260 lb (117.935 kg)  01/09/14 260 lb (117.935 kg)  10/27/13 263 lb 9.6 oz (119.568 kg)   General Appearance: Well nourished, in no  apparent distress. Eyes: PERRLA, EOMs, conjunctiva no swelling or erythema Sinuses: No Frontal/maxillary tenderness ENT/Mouth: Ext aud canals clear, TMs without  erythema, bulging. No erythema, swelling, or exudate on post pharynx.  Tonsils not swollen or erythematous. Hearing normal.  Neck: Supple, thyroid normal.  Respiratory: Respiratory effort normal, BS equal bilaterally without rales, rhonchi, wheezing or stridor.  Cardio: RRR with systolic murmur. Brisk peripheral pulses with 2+ edema.  Abdomen: Soft, + BS, obese  Non tender, no guarding, rebound, hernias, masses. Lymphatics: Non tender without lymphadenopathy.  Musculoskeletal: Full ROM, 5/5 strength, walks with a cane.  Skin: Warm, dry without rashes, lesions, ecchymosis.  Neuro: Cranial nerves intact. Normal muscle tone, no cerebellar symptoms.  Psych: Awake and oriented X 3, normal affect, Insight and Judgment appropriate.    Vicie Mutters 10:58 AM

## 2014-02-01 NOTE — Patient Instructions (Signed)
Angina Pectoris  Angina pectoris, often just called angina, is extreme discomfort in your chest, neck, or arm caused by a lack of blood in the middle and thickest layer of your heart wall (myocardium). It may feel like tightness or heavy pressure. It may feel like a crushing or squeezing pain. Some people say it feels like gas or indigestion. It may go down your shoulders, back, and arms. Some people may have symptoms other than pain. These symptoms include fatigue, shortness of breath, cold sweats, or nausea. There are four different types of angina:  · Stable angina--Stable angina usually occurs in episodes of predictable frequency and duration. It usually is brought on by physical activity, emotional stress, or excitement. These are all times when the myocardium needs more oxygen. Stable angina usually lasts a few minutes and often is relieved by taking a medicine that can be taken under your tongue (sublingually). The medicine is called nitroglycerin. Stable angina is caused by a buildup of plaque inside the arteries, which restricts blood flow to the heart muscle (atherosclerosis).  · Unstable angina--Unstable angina can occur even when your body experiences little or no physical exertion. It can occur during sleep. It can also occur at rest. It can suddenly increase in severity or frequency. It might not be relieved by sublingual nitroglycerin. It can last up to 30 minutes. The most common cause of unstable angina is a blood clot that has developed on the top of plaque buildup inside a coronary artery. It can lead to a heart attack if the blood clot completely blocks the artery.  · Microvascular angina--This type of angina is caused by a disorder of tiny blood vessels called arterioles. Microvascular angina is more common in women. The pain may be more severe and last longer than other types of angina pectoris.  · Prinzmetal or variant angina--This type of angina pectoris usually occurs when your body  experiences little or no physical exertion. It especially occurs in the early morning hours. It is caused by a spasm of your coronary artery.  HOME CARE INSTRUCTIONS   · Only take over-the-counter and prescription medicines as directed by your health care provider.  · Stay active or increase your exercise as directed by your health care provider.  · Limit strenuous activity as directed by your health care provider.  · Limit heavy lifting as directed by your health care provider.  · Maintain a healthy weight.  · Learn about and eat heart-healthy foods.  · Do not use any tobacco products including cigarettes, chewing tobacco or electronic cigarettes.  SEEK IMMEDIATE MEDICAL CARE IF:   You experience the following symptoms:  · Chest, neck, deep shoulder, or arm pain or discomfort that lasts more than a few minutes.  · Chest, neck, deep shoulder, or arm pain or discomfort that goes away and comes back, repeatedly.  · Heavy sweating with discomfort, without a noticeable cause.  · Shortness of breath or difficulty breathing.  · Angina that does not get better after a few minutes of rest or after taking sublingual nitroglycerin.  These can all be symptoms of a heart attack, which is a medical emergency! Get medical help at once. Call your local emergency service (911 in [place] immediately. Do not  drive yourself to the hospital and do not  wait to for your symptoms to go away.  MAKE SURE YOU:  · Understand these instructions.  · Will watch your condition.  · Will get help right away if you are not   doing well or get worse.  Document Released: 05/12/2005 Document Revised: 05/17/2013 Document Reviewed: 09/13/2013  ExitCare® Patient Information ©2015 [employer]. This information is not intended to replace advice given to you by your health care provider. Make sure you discuss any questions you have with your health care provider.

## 2014-02-02 LAB — TSH: TSH: 0.604 u[IU]/mL (ref 0.350–4.500)

## 2014-03-08 ENCOUNTER — Ambulatory Visit (INDEPENDENT_AMBULATORY_CARE_PROVIDER_SITE_OTHER): Payer: Medicare Other | Admitting: *Deleted

## 2014-03-08 DIAGNOSIS — E039 Hypothyroidism, unspecified: Secondary | ICD-10-CM

## 2014-03-08 DIAGNOSIS — R7989 Other specified abnormal findings of blood chemistry: Secondary | ICD-10-CM | POA: Diagnosis not present

## 2014-03-08 NOTE — Progress Notes (Signed)
Patient ID: [name], male   DOB: 05-Jul-1924, 80 y.o.   MRN: [id number] Patient states he is currently taking Levothyroxine 200 mcg  6 days per wee.  Leaves pill off on Sundays.  Instructions from last labs were to take 1/2 pill on Sundays but patient misunderstood instructions.

## 2014-03-09 ENCOUNTER — Other Ambulatory Visit: Payer: Self-pay | Admitting: Internal Medicine

## 2014-03-09 LAB — TSH: TSH: 4.498 u[IU]/mL (ref 0.350–4.500)

## 2014-04-07 ENCOUNTER — Ambulatory Visit: Payer: Self-pay | Admitting: Internal Medicine

## 2014-04-10 DIAGNOSIS — H7102 Cholesteatoma of attic, left ear: Secondary | ICD-10-CM | POA: Diagnosis not present

## 2014-04-10 DIAGNOSIS — H9072 Mixed conductive and sensorineural hearing loss, unilateral, left ear, with unrestricted hearing on the contralateral side: Secondary | ICD-10-CM | POA: Diagnosis not present

## 2014-04-10 DIAGNOSIS — H6123 Impacted cerumen, bilateral: Secondary | ICD-10-CM | POA: Diagnosis not present

## 2014-04-19 ENCOUNTER — Encounter: Payer: Self-pay | Admitting: Podiatry

## 2014-04-19 ENCOUNTER — Ambulatory Visit (INDEPENDENT_AMBULATORY_CARE_PROVIDER_SITE_OTHER): Payer: Medicare Other | Admitting: Podiatry

## 2014-04-19 DIAGNOSIS — M79676 Pain in unspecified toe(s): Secondary | ICD-10-CM | POA: Diagnosis not present

## 2014-04-19 DIAGNOSIS — B351 Tinea unguium: Secondary | ICD-10-CM

## 2014-04-19 NOTE — Patient Instructions (Signed)
Diabetes and Foot Care Diabetes may cause you to have problems because of poor blood supply (circulation) to your feet and legs. This may cause the skin on your feet to become thinner, break easier, and heal more slowly. Your skin may become dry, and the skin may peel and crack. You may also have nerve damage in your legs and feet causing decreased feeling in them. You may not notice minor injuries to your feet that could lead to infections or more serious problems. Taking care of your feet is one of the most important things you can do for yourself.  HOME CARE INSTRUCTIONS  Wear shoes at all times, even in the house. Do not go barefoot. Bare feet are easily injured.  Check your feet daily for blisters, cuts, and redness. If you cannot see the bottom of your feet, use a mirror or ask someone for help.  Wash your feet with warm water (do not use hot water) and mild soap. Then pat your feet and the areas between your toes until they are completely dry. Do not soak your feet as this can dry your skin.  Apply a moisturizing lotion or petroleum jelly (that does not contain alcohol and is unscented) to the skin on your feet and to dry, brittle toenails. Do not apply lotion between your toes.  Trim your toenails straight across. Do not dig under them or around the cuticle. File the edges of your nails with an emery board or nail file.  Do not cut corns or calluses or try to remove them with medicine.  Wear clean socks or stockings every day. Make sure they are not too tight. Do not wear knee-high stockings since they may decrease blood flow to your legs.  Wear shoes that fit properly and have enough cushioning. To break in new shoes, wear them for just a few hours a day. This prevents you from injuring your feet. Always look in your shoes before you put them on to be sure there are no objects inside.  Do not cross your legs. This may decrease the blood flow to your feet.  If you find a minor scrape,  cut, or break in the skin on your feet, keep it and the skin around it clean and dry. These areas may be cleansed with mild soap and water. Do not cleanse the area with peroxide, alcohol, or iodine.  When you remove an adhesive bandage, be sure not to damage the skin around it.  If you have a wound, look at it several times a day to make sure it is healing.  Do not use heating pads or hot water bottles. They may burn your skin. If you have lost feeling in your feet or legs, you may not know it is happening until it is too late.  Make sure your health care provider performs a complete foot exam at least annually or more often if you have foot problems. Report any cuts, sores, or bruises to your health care provider immediately. SEEK MEDICAL CARE IF:   You have an injury that is not healing.  You have cuts or breaks in the skin.  You have an ingrown nail.  You notice redness on your legs or feet.  You feel burning or tingling in your legs or feet.  You have pain or cramps in your legs and feet.  Your legs or feet are numb.  Your feet always feel cold. SEEK IMMEDIATE MEDICAL CARE IF:   There is increasing redness,   swelling, or pain in or around a wound.  There is a red line that goes up your leg.  Pus is coming from a wound.  You develop a fever or as directed by your health care provider.  You notice a bad smell coming from an ulcer or wound. Document Released: 05/09/2000 Document Revised: 01/12/2013 Document Reviewed: 10/19/2012 ExitCare Patient Information 2015 [employer]. This information is not intended to replace advice given to you by your health care provider. Make sure you discuss any questions you have with your health care provider.  

## 2014-04-19 NOTE — Progress Notes (Signed)
Patient ID: [name], male   DOB: 1924/12/25, 80 y.o.   MRN: [id number]  Subjective: 80 year old male returns the office today for diabetic risk assessment and for painful elongated toenails. He states that since last appointment he has trimmed some of the nails himself as they got elongated and he had some bleeding from the nails. He denies any redness or drainage from the nail sites. Denies any recent open lesions or any pain to the bilateral feet. Denies any acute changes his last appointment. Denies any systemic complaints as fevers, chills, nausea, vomiting. No other complaints at this time.  Objective: AAO 3, NAD DP/PT pulses palpable bilaterally although decrease, CRT less than 3 seconds chronic bilateral lower extremity edema. There is no pain with calf compression, swelling, warmth, erythema. Decreased protective sensation with Derrel Nip monofilament, decreased vibratory sensation. Nails hypertrophic, dystrophic, elongated, brittle, discolored 10. No surrounding erythema or drainage. There is no evidence of dried blood underneath the distal aspect of the nail particularly the left second and fourth digit. There is no clinical signs of infection. No open lesions or pre-ulcerative lesions. No interdigital maceration. No areas of pinpoint bony tenderness or pain with vibratory sensation. Areas of prior burn of healed  Assessment: 80 year old male with symptomatic onychomycosis  Plan: -Treatment options discussed including alternatives, risks, complications. -Nail sharply debrided 10 without complications. -Monitoring clinical signs or symptoms of infection and directed to call the office if any are to occur. Discussed the patient that if he needs any nail debridement prior to his next a point call the office immediately take her for him. Discussed the importance of daily foot inspection. -Follow-up in 3 months or sooner if any problems are to arise. In the meantime, call the  office with any questions, concerns, change in symptoms.

## 2014-04-21 ENCOUNTER — Observation Stay (HOSPITAL_COMMUNITY)
Admission: EM | Admit: 2014-04-21 | Discharge: 2014-04-23 | Disposition: A | Discharge status: Home / Self Care | Payer: Medicare Other | Attending: Internal Medicine | Admitting: Internal Medicine

## 2014-04-21 ENCOUNTER — Emergency Department (HOSPITAL_COMMUNITY): Payer: Medicare Other

## 2014-04-21 ENCOUNTER — Encounter (HOSPITAL_COMMUNITY): Payer: Self-pay

## 2014-04-21 DIAGNOSIS — N179 Acute kidney failure, unspecified: Secondary | ICD-10-CM | POA: Diagnosis not present

## 2014-04-21 DIAGNOSIS — Z85828 Personal history of other malignant neoplasm of skin: Secondary | ICD-10-CM | POA: Diagnosis not present

## 2014-04-21 DIAGNOSIS — I714 Abdominal aortic aneurysm, without rupture: Secondary | ICD-10-CM | POA: Diagnosis not present

## 2014-04-21 DIAGNOSIS — J432 Centrilobular emphysema: Secondary | ICD-10-CM | POA: Diagnosis not present

## 2014-04-21 DIAGNOSIS — E038 Other specified hypothyroidism: Secondary | ICD-10-CM | POA: Diagnosis not present

## 2014-04-21 DIAGNOSIS — Z951 Presence of aortocoronary bypass graft: Secondary | ICD-10-CM | POA: Diagnosis not present

## 2014-04-21 DIAGNOSIS — R0602 Shortness of breath: Secondary | ICD-10-CM | POA: Diagnosis not present

## 2014-04-21 DIAGNOSIS — J449 Chronic obstructive pulmonary disease, unspecified: Secondary | ICD-10-CM | POA: Diagnosis not present

## 2014-04-21 DIAGNOSIS — E559 Vitamin D deficiency, unspecified: Secondary | ICD-10-CM | POA: Diagnosis present

## 2014-04-21 DIAGNOSIS — I25810 Atherosclerosis of coronary artery bypass graft(s) without angina pectoris: Secondary | ICD-10-CM | POA: Diagnosis present

## 2014-04-21 DIAGNOSIS — E785 Hyperlipidemia, unspecified: Secondary | ICD-10-CM | POA: Insufficient documentation

## 2014-04-21 DIAGNOSIS — E782 Mixed hyperlipidemia: Secondary | ICD-10-CM | POA: Diagnosis present

## 2014-04-21 DIAGNOSIS — J9811 Atelectasis: Secondary | ICD-10-CM | POA: Diagnosis not present

## 2014-04-21 DIAGNOSIS — I10 Essential (primary) hypertension: Secondary | ICD-10-CM | POA: Diagnosis present

## 2014-04-21 DIAGNOSIS — E039 Hypothyroidism, unspecified: Secondary | ICD-10-CM | POA: Diagnosis present

## 2014-04-21 DIAGNOSIS — I2510 Atherosclerotic heart disease of native coronary artery without angina pectoris: Secondary | ICD-10-CM | POA: Diagnosis not present

## 2014-04-21 DIAGNOSIS — I25118 Atherosclerotic heart disease of native coronary artery with other forms of angina pectoris: Secondary | ICD-10-CM | POA: Diagnosis not present

## 2014-04-21 DIAGNOSIS — R55 Syncope and collapse: Principal | ICD-10-CM | POA: Diagnosis present

## 2014-04-21 DIAGNOSIS — R42 Dizziness and giddiness: Secondary | ICD-10-CM | POA: Diagnosis not present

## 2014-04-21 DIAGNOSIS — J439 Emphysema, unspecified: Secondary | ICD-10-CM | POA: Diagnosis not present

## 2014-04-21 DIAGNOSIS — R404 Transient alteration of awareness: Secondary | ICD-10-CM | POA: Diagnosis not present

## 2014-04-21 DIAGNOSIS — I739 Peripheral vascular disease, unspecified: Secondary | ICD-10-CM | POA: Diagnosis not present

## 2014-04-21 DIAGNOSIS — M6281 Muscle weakness (generalized): Secondary | ICD-10-CM | POA: Diagnosis not present

## 2014-04-21 DIAGNOSIS — R110 Nausea: Secondary | ICD-10-CM | POA: Diagnosis not present

## 2014-04-21 LAB — CBC WITH DIFFERENTIAL/PLATELET
BASOS PCT: 1 % (ref 0–1)
Basophils Absolute: 0.1 10*3/uL (ref 0.0–0.1)
EOS ABS: 0.2 10*3/uL (ref 0.0–0.7)
Eosinophils Relative: 2 % (ref 0–5)
HCT: 38.5 % — ABNORMAL LOW (ref 39.0–52.0)
Hemoglobin: 12.6 g/dL — ABNORMAL LOW (ref 13.0–17.0)
Lymphocytes Relative: 34 % (ref 12–46)
Lymphs Abs: 2.9 10*3/uL (ref 0.7–4.0)
MCH: 29.5 pg (ref 26.0–34.0)
MCHC: 32.7 g/dL (ref 30.0–36.0)
MCV: 90.2 fL (ref 78.0–100.0)
Monocytes Absolute: 0.8 10*3/uL (ref 0.1–1.0)
Monocytes Relative: 9 % (ref 3–12)
NEUTROS PCT: 54 % (ref 43–77)
Neutro Abs: 4.6 10*3/uL (ref 1.7–7.7)
PLATELETS: 201 10*3/uL (ref 150–400)
RBC: 4.27 MIL/uL (ref 4.22–5.81)
RDW: 14.4 % (ref 11.5–15.5)
WBC: 8.5 10*3/uL (ref 4.0–10.5)

## 2014-04-21 LAB — PROTIME-INR
INR: 1.03 (ref 0.00–1.49)
Prothrombin Time: 13.6 seconds (ref 11.6–15.2)

## 2014-04-21 LAB — D-DIMER, QUANTITATIVE (NOT AT ARMC): D DIMER QUANT: 1.27 ug{FEU}/mL — AB (ref 0.00–0.48)

## 2014-04-21 LAB — COMPREHENSIVE METABOLIC PANEL
ALBUMIN: 3.6 g/dL (ref 3.5–5.2)
ALK PHOS: 79 U/L (ref 39–117)
ALT: 17 U/L (ref 0–53)
ANION GAP: 15 (ref 5–15)
AST: 18 U/L (ref 0–37)
BUN: 17 mg/dL (ref 6–23)
CO2: 21 mEq/L (ref 19–32)
Calcium: 9.4 mg/dL (ref 8.4–10.5)
Chloride: 103 mEq/L (ref 96–112)
Creatinine, Ser: 1.33 mg/dL (ref 0.50–1.35)
GFR calc Af Amer: 54 mL/min — ABNORMAL LOW (ref >90)
GFR calc non Af Amer: 46 mL/min — ABNORMAL LOW (ref >90)
Glucose, Bld: 120 mg/dL — ABNORMAL HIGH (ref 70–99)
POTASSIUM: 4.7 meq/L (ref 3.7–5.3)
SODIUM: 139 meq/L (ref 137–147)
TOTAL PROTEIN: 6.6 g/dL (ref 6.0–8.3)
Total Bilirubin: 0.4 mg/dL (ref 0.3–1.2)

## 2014-04-21 LAB — URINALYSIS, ROUTINE W REFLEX MICROSCOPIC
Glucose, UA: NEGATIVE mg/dL
Hgb urine dipstick: NEGATIVE
Ketones, ur: NEGATIVE mg/dL
Leukocytes, UA: NEGATIVE
Nitrite: NEGATIVE
PROTEIN: NEGATIVE mg/dL
Specific Gravity, Urine: 1.021 (ref 1.005–1.030)
Urobilinogen, UA: 1 mg/dL (ref 0.0–1.0)
pH: 5 (ref 5.0–8.0)

## 2014-04-21 LAB — I-STAT TROPONIN, ED: Troponin i, poc: 0 ng/mL (ref 0.00–0.08)

## 2014-04-21 LAB — CBG MONITORING, ED: GLUCOSE-CAPILLARY: 101 mg/dL — AB (ref 70–99)

## 2014-04-21 LAB — TROPONIN I: Troponin I: <0.3 ng/mL (ref <0.30)

## 2014-04-21 MED ORDER — SODIUM CHLORIDE 0.9 % IV SOLN: INTRAVENOUS | Status: DC

## 2014-04-21 MED ORDER — ONDANSETRON HCL 4 MG PO TABS: 4.0000 mg | ORAL_TABLET | Freq: Four times a day (QID) | ORAL | Status: DC | PRN

## 2014-04-21 MED ORDER — NITROGLYCERIN 0.4 MG SL SUBL: 0.4000 mg | SUBLINGUAL_TABLET | SUBLINGUAL | Status: DC | PRN

## 2014-04-21 MED ORDER — HYDROCODONE-ACETAMINOPHEN 5-325 MG PO TABS: 1.0000 | ORAL_TABLET | Freq: Four times a day (QID) | ORAL | Status: DC | PRN

## 2014-04-21 MED ORDER — HEPARIN SODIUM (PORCINE) 5000 UNIT/ML IJ SOLN
5000.0000 [IU] | Freq: Three times a day (TID) | INTRAMUSCULAR | Status: DC
  Administered 2014-04-21 – 2014-04-23 (×5): 5000 [IU] via SUBCUTANEOUS
  Filled 2014-04-21 (×9): qty 1

## 2014-04-21 MED ORDER — ONDANSETRON HCL 4 MG/2ML IJ SOLN: 4.0000 mg | Freq: Four times a day (QID) | INTRAMUSCULAR | Status: DC | PRN

## 2014-04-21 MED ORDER — IOHEXOL 350 MG/ML SOLN
80.0000 mL | Freq: Once | INTRAVENOUS | Status: AC | PRN
  Administered 2014-04-21: 80 mL via INTRAVENOUS

## 2014-04-21 MED ORDER — LEVOTHYROXINE SODIUM 200 MCG PO TABS
200.0000 ug | ORAL_TABLET | Freq: Every day | ORAL | Status: DC
  Administered 2014-04-22 – 2014-04-23 (×2): 200 ug via ORAL
  Filled 2014-04-21 (×3): qty 1

## 2014-04-21 MED ORDER — SODIUM CHLORIDE 0.9 % IV SOLN
INTRAVENOUS | Status: DC
  Administered 2014-04-21: 23:00:00 via INTRAVENOUS

## 2014-04-21 MED ORDER — ISOSORBIDE MONONITRATE ER 60 MG PO TB24
90.0000 mg | ORAL_TABLET | Freq: Every day | ORAL | Status: DC
  Administered 2014-04-22 – 2014-04-23 (×2): 90 mg via ORAL
  Filled 2014-04-21 (×2): qty 1

## 2014-04-21 MED ORDER — CYCLOBENZAPRINE HCL 10 MG PO TABS: 5.0000 mg | ORAL_TABLET | Freq: Three times a day (TID) | ORAL | Status: DC | PRN

## 2014-04-21 MED ORDER — SODIUM CHLORIDE 0.9 % IJ SOLN
3.0000 mL | Freq: Two times a day (BID) | INTRAMUSCULAR | Status: DC
  Administered 2014-04-21 – 2014-04-23 (×4): 3 mL via INTRAVENOUS

## 2014-04-21 NOTE — ED Provider Notes (Signed)
CSN: [id number]     Arrival date & time 04/21/14  1635 History   First MD Initiated Contact with Patient 04/21/14 1635     Chief Complaint  Patient presents with  . Shortness of Breath     (Consider location/radiation/quality/duration/timing/severity/associated sxs/prior Treatment) HPI  PCP: [facility] Cardiologist: Dr. Percival Spanish  Patient to the ED by EMS for syncopal episode/near syncope. He has a PMH of CAD with 95% left main stenosis. Exertional dyspnea requiring home 02 prn, hypertension, COPD, AAA (3.3 cm), hypothyroidism.  The patients daughter and granddaughter were with the patient when the incident happened. 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 He tried to stand up and then slumped into his chair. The daughter reports the patient did not answer for a few seconds and appeared to be unconscious, he then started mumbling. He was also pale and diaphoretic. She then called EMS. Pt had no head injury or fall.  Per the patient he reports feeling very bad and sweaty while working the the watches. Denied having any pain but feeling generally weak and nauseous. "Next thing I know the ambulance is getting me". He does not remember nearly passing out or mumbling. Per EMS he was back to baseline on there arrival, no neuro deficits, normal CBG, no  EKG changes, 02 sats 91% on room air.   Patient currently has no complaints of feels "normal" or baseline for him.  Past Medical History  Diagnosis Date  . Hypothyroidism   . 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  Marland Kitchen Hernia   . Bone spur     and fused vertebrae.  He   is treated by Dr. Theda Sers.  Marland Kitchen CAD (coronary artery disease)     Cath March 2012. 95% left main stenosis. Patent LIMA to the LAD, patent saphenous vein graft to OM, patent saphenous vein graft to the right coronary artery.  Marland Kitchen PVD (peripheral  vascular disease)     (90% left iliac stenosis)  . Aortic ectasia       no abdominal aortic aneurysm.  He is followed for this by   Dr. Curt Jews.  . Constipated   . Hypertension   . COPD (chronic obstructive pulmonary disease)   . Hyperlipidemia   . Abdominal aortic aneurysm     3.3 cm  . Pre-diabetes   . Vitamin D deficiency    Past Surgical History  Procedure Laterality Date  . Pneumonectomy    . Knee surgery    . Cataract extraction    . Pilonidal cyst / sinus excision    . Coronary artery bypass graft       2010 Median sternotomy, extracorporeal circulation,   coronary artery bypass graft surgery x3 using a left internal mammary   artery graft to left anterior descending coronary artery, with a    saphenous vein graft to the intermediate coronary artery, and a   saphenous vein graft to the right coronary artery.  Endoscopic vein   harvesting from the left leg.    Family History  Problem Relation Age of Onset  . Heart disease Mother   . Pneumonia Father    History  Substance Use Topics  . Smoking status: Former Smoker    Quit date: 06/23/2002  . Smokeless tobacco: Not on file  . Alcohol Use: No    Review  of Systems 10 Systems reviewed and are negative for acute change except as noted in the HPI.    Allergies  Prednisone  Home Medications   Prior to Admission medications   Medication Sig Start Date End Date Taking? Authorizing Provider  Cholecalciferol (VITAMIN D-3) 5000 UNITS TABS Take by mouth daily.    Yes Historical Provider, MD  Coenzyme Q10 400 MG CAPS Take 1 capsule by mouth daily.   Yes Historical Provider, MD  cyclobenzaprine (FLEXERIL) 10 MG tablet TAKE ONE-HALF TO ONE TABLET BY MOUTH THREE TIMES DAILY AS NEEDED FOR MUSCLE SPASM 06/07/13  Yes [name], MD  enalapril (VASOTEC) 20 MG tablet TAKE ONE TABLET BY MOUTH DAILY 03/09/14  Yes Vicie Mutters, PA-C  fish oil-omega-3 fatty acids 1000 MG capsule Take 1 g by mouth daily.     Yes Historical  Provider, MD  furosemide (LASIX) 40 MG tablet Take 40 mg by mouth daily. 03/29/13  Yes [name], MD  HYDROcodone-acetaminophen (NORCO) 5-325 MG per tablet Take 1 tablet by mouth every 6 (six) hours as needed for moderate pain. 01/09/14  Yes Vicie Mutters, PA-C  isosorbide mononitrate (IMDUR) 60 MG 24 hr tablet TAKE ONE AND ONE-HALF TABLETS BY MOUTH ONCE DAILY   Yes Minus Breeding, MD  levothyroxine (SYNTHROID, LEVOTHROID) 200 MCG tablet TAKE ONE TABLET BY MOUTH EVERY DAY 03/09/14  Yes Vicie Mutters, PA-C  MAGNESIUM PO Take 250 mg by mouth daily.    Yes Historical Provider, MD  Multiple Vitamin (MULTIVITAMIN) tablet Take 1 tablet by mouth daily.   Yes Historical Provider, MD  potassium chloride SA (K-DUR,KLOR-CON) 20 MEQ tablet Take 20 mEq by mouth daily.   Yes Historical Provider, MD  ramipril (ALTACE) 2.5 MG capsule Take 2.5 mg by mouth daily.   Yes Historical Provider, MD  triamcinolone cream (KENALOG) 0.1 % Apply 1 application topically 2 (two) times daily. 01/09/14  Yes Vicie Mutters, PA-C  nitroGLYCERIN (NITROSTAT) 0.4 MG SL tablet Place 1 tablet (0.4 mg total) under the tongue every 5 (five) minutes as needed. 10/25/12   Minus Breeding, MD   BP 121/65 mmHg  Pulse 82  Temp(Src) 98.5 F (36.9 C) (Oral)  Resp 15  SpO2 96% Physical Exam  Constitutional: He appears well-developed and well-nourished. No distress.  HENT:  Head: Normocephalic and atraumatic. Head is without abrasion and without contusion.  Nose: Nose normal.  Mouth/Throat: Uvula is midline.  Eyes: Pupils are equal, round, and reactive to light.  Neck: Normal range of motion. Neck supple.  Cardiovascular: Normal rate and regular rhythm.   Pulmonary/Chest: Effort normal.  Abdominal: Soft.  Musculoskeletal:  Swelling to bilateral lower extremities, L > R. Baseline per patient.  Neurological: He is alert.  Cranial nerves II-VIII and X-XII evaluated and show no deficits. Pt alert and oriented x 3 Upper and lower  extremity strength is symmetrical and physiologic Normal muscular tone No facial droop Coordination intact, no limb ataxia, finger-nose-finger normal Rapid alternating movements normal No pronator drift to upper arms  Skin: Skin is warm and dry.  Nursing note and vitals reviewed.     ED Course  Procedures (including critical care time) Labs Review Labs Reviewed  CBC WITH DIFFERENTIAL - Abnormal; Notable for the following:    Hemoglobin 12.6 (*)    HCT 38.5 (*)    All other components within normal limits  COMPREHENSIVE METABOLIC PANEL - Abnormal; Notable for the following:    Glucose, Bld 120 (*)    GFR calc non Af Amer 46 (*)  GFR calc Af Amer 54 (*)    All other components within normal limits  URINALYSIS, ROUTINE W REFLEX MICROSCOPIC - Abnormal; Notable for the following:    Color, Urine AMBER (*)    APPearance HAZY (*)    Bilirubin Urine SMALL (*)    All other components within normal limits  D-DIMER, QUANTITATIVE - Abnormal; Notable for the following:    D-Dimer, Quant 1.27 (*)    All other components within normal limits  CBG MONITORING, ED - Abnormal; Notable for the following:    Glucose-Capillary 101 (*)    All other components within normal limits  PROTIME-INR  TROPONIN I  POCT CBG (FASTING - GLUCOSE)-MANUAL ENTRY  I-STAT TROPOININ, ED    Imaging Review Dg Chest 2 View  04/21/2014   CLINICAL DATA:  Shortness of breath.  EXAM: CHEST  2 VIEW  COMPARISON:  October 31, 2013.  FINDINGS: Stable cardiomediastinal silhouette. Sternotomy wires are noted. No pneumothorax or significant pleural effusion is noted. No acute pulmonary disease is noted. Patient is rotated to the left. Degenerative changes seen involving the acromioclavicular joints bilaterally.  IMPRESSION: No active cardiopulmonary disease.   Electronically Signed   By: Sabino Dick M.D.   On: 04/21/2014 17:34   Ct Head Wo Contrast  04/21/2014   CLINICAL DATA:  Dizziness, nausea dizziness and nausea today   EXAM: CT HEAD WITHOUT CONTRAST  TECHNIQUE: Contiguous axial images were obtained from the base of the skull through the vertex without intravenous contrast.  COMPARISON:  None.  FINDINGS: No skull fracture is noted. Paranasal sinuses and mastoid air cells are unremarkable.  No intracranial hemorrhage, mass effect or midline shift. No acute cortical infarction. Mild cerebral atrophy. No mass lesion is noted on this unenhanced scan. Periventricular and patchy subcortical white matter decreased attenuation probable due to chronic small vessel ischemic changes.  IMPRESSION: No acute intracranial abnormality. Mild cerebral atrophy. Probable chronic periventricular and patchy subcortical white matter disease.   Electronically Signed   By: Lahoma Crocker M.D.   On: 04/21/2014 17:47   Ct Angio Chest Pe W/cm &/or Wo Cm  04/21/2014   CLINICAL DATA:  80 year old male with a syncopal episode and elevated D-dimer. Evaluate for pulmonary embolism.  EXAM: CT ANGIOGRAPHY CHEST WITH CONTRAST  TECHNIQUE: Multidetector CT imaging of the chest was performed using the standard protocol during bolus administration of intravenous contrast. Multiplanar CT image reconstructions and MIPs were obtained to evaluate the vascular anatomy.  CONTRAST:  67mL OMNIPAQUE IOHEXOL 350 MG/ML SOLN  COMPARISON:  Chest x-ray obtained earlier today; prior chest CT 11/01/2008  FINDINGS: Mediastinum: Unremarkable CT appearance of the thyroid gland. No suspicious mediastinal or hilar adenopathy. No soft tissue mediastinal mass. Small hiatal hernia.  Heart/Vascular: The heart is within normal limits for size. No pericardial effusion. Status post multivessel coronary artery bypass grafting. The thoracic aorta is markedly tortuous but not aneurysmal. Scattered atherosclerotic vascular calcifications. Adequate opacification of the pulmonary arteries to the proximal subsegmental level. Evaluation of the segmental and subsegmental arteries is slightly limited by  respiratory motion. No central filling defect to suggest acute pulmonary embolus.  Lungs/Pleura: Surgical changes of prior right upper lobectomy. No pleural effusion. Mild combined paraseptal and centrilobular emphysema. Subpleural reticulation and early honeycombing with architectural distortion noted in the right lower lobe likely representing fibrotic change, or possibly post radiation fibrosis. Respiratory motion limits evaluation for small pulmonary nodules. No focal airspace consolidation. There is mild dependent atelectasis bilaterally.  Bones/Soft Tissues: No acute fracture or aggressive  appearing lytic or blastic osseous lesion. Healed median sternotomy. Multilevel degenerative change throughout the spine.  Upper Abdomen: The visualized upper abdomen is unremarkable.  Review of the MIP images confirms the above findings.  IMPRESSION: 1. Negative for pulmonary embolus, pneumonia or other acute cardiopulmonary process. 2. Surgical changes of prior right upper lobectomy. 3. Centrilobular and paraseptal emphysema with fibrotic change in the inferior periphery of the right lower lobe. 4. Surgical changes of prior multivessel CABG. 5. Highly tortuous but not aneurysmal thoracic aorta. 6. Mild dependent atelectasis bilaterally.   Electronically Signed   By: Jacqulynn Cadet M.D.   On: 04/21/2014 19:44     EKG Interpretation   Date/Time:  Friday April 21 2014 16:45:16 EST Ventricular Rate:  80 PR Interval:  181 QRS Duration: 93 QT Interval:  392 QTC Calculation: 452 R Axis:     Text Interpretation:  Sinus rhythm Atrial premature complex Low voltage,  precordial leads No significant change since last tracing Confirmed by  Debby Freiberg [phone]) on 04/21/2014 5:24:34 PM      MDM   Final diagnoses:  Syncope   Patients work-up in the ED is unremarkable to explain syncopal episode earlier today. He has known cardiac disease and risk factors for stroke. It is possible he may have attempted to  go from sitting to standing when he syncopized but it was unwitnessed.  Patient will need further evaluation and monitoring for syncope.  Inpatient, [facility], [facility] ED. Dr.Mullen    Linus Mako, PA-C 04/21/14 2010  Linus Mako, PA-C 04/21/14 2013  Debby Freiberg, MD 04/21/14 2118

## 2014-04-21 NOTE — H&P (Signed)
[facility] History and Physical  ZEFERINO MOUNTS VOH:[id number] DOB: 09/26/1924 DOA: 04/21/2014  Referring physician: ED physician PCP: Alesia Richards, MD   Chief Complaint: diaphoresis, syncope  HPI:  Mr. Fish is an 80yo man with PMH of CAD s/p CABG, PVD, HTN, hypothyroidism, HLD, chronic angina, vitamin d deficiency, COPD with h/o lung cancer s/p resection > 5 years ago, Grade 1 diastolic dysfunction (TTE 2011) who presented with syncope.  Patient's daughter Lenna Sciara is present who also witnessed the event.  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  He did not have any jerking movements, urinary loss.  EMS was called and evaluated the patient.  He was brought to the ED and when he arrived, he was "back to normal" per the patient.  He remembers part of the episode, but not all.  Further symptoms include diaphoresis, dizziness.  He denies chest pain, increased SOB (DOE at baseline), recent fever, chills, nausea, vomiting, diarrhea, orthopnea, blurry vision, seizure like activity, palpitations.  He has chronic LE swelling.  He has had intermittent urinary problems including difficulty starting stream, but no dysuria.  He only takes his lasix every few days b/c he does not want to have to go the bathroom overnight.  He reports that he has been eating and drinking normally; yesterday, on Thanksgiving, they had a large spread including regular Thanksgiving food.  He has been drinking hot chocolate a lot recently b/c he was told by a friend that it will help his blood pressure.  He is also a coffee drinker.    Assessment and Plan: Principal Problems:    Syncope: Likely causes include dehydration/AKI, cardiac arrhythmia or vasovagal.  He does not have any signs of seizure like activity in history to cause concern.   He also has a relatively normal neuro exam, so I am less concerned for acute stroke.   - Admit to obs for overnight - Telemetry - Fluids given AKI (see below).  Would watch for respiratory distress closely given history of Grade 1 diastolic dysfunction.  NS at 75cc/hr - Heart Healthy diet - Check TSH - Check orthostatic vital signs - CE X 1 more, first 2 negative - EKG in the AM - CXR reviewed, no acute issue.  CT head, possible chronic changes, no acute.  He had a CTA of the chest due to an elevated D dimer which did not show any acute clot.   - Can consider repeat TTE if concern for new cardiac issue arises  - Hold lasix   AKI: Cr up to 1.33 from a baseline of 0.9-1.0.  Mild increase, however, patient also received contrast tonight and this could worsen - Unclear if he has been eating/drinking normally, though he doesn't report any drastic changes to diet - NS overnight at 75cc/hr, with stop time - Check BMET in the AM; I am concerned given the contrast that his renal function could worsen - Heart Healthy diet - hold nephrotoxins - per chart review, he is listed as being on 2 Ace-Inhibitors (ramipril and enalapril).  In his most recent PCP notes, he is only listed as being on enalapril.  Mr. Bessinger is not sure if he has been taking both.  Regardless, he will need thorough medication reconciliation on discharge.   - Check urine lytes, osm.  Serum osm for FENA  Active Problems:  Hypothyroidism - Check TSH, continue home synthroid    Hyperlipidemia - Take fish oil.  He does not like the idea of statins    Coronary atherosclerosis - Currently no chest pain and he has not needed NTG in "a while."   - Hold lasix and ACE-I as noted above - Continue imdur - Per home meds, he is not on a beta blocker.      PVD (peripheral vascular disease) with claudication - Chronic pain due to this and knee atherosclerosis - Continue home pain medications including flexeril and norco    Essential  hypertension - BP has been lower, holding ACE-I and lasix due to above - Monitor for increase BP and start a PRN medication as needed    Vitamin D deficiency - On home vitamin D, resume on discharge    COPD (chronic obstructive pulmonary disease) - Not on any inhalers, h/o surgery for lung cancer - On O2 at night, will be ordered while here in the hospital.   Radiological Exams on Admission: Dg Chest 2 View  04/21/2014   CLINICAL DATA:  Shortness of breath.  EXAM: CHEST  2 VIEW  COMPARISON:  October 31, 2013.  FINDINGS: Stable cardiomediastinal silhouette. Sternotomy wires are noted. No pneumothorax or significant pleural effusion is noted. No acute pulmonary disease is noted. Patient is rotated to the left. Degenerative changes seen involving the acromioclavicular joints bilaterally.  IMPRESSION: No active cardiopulmonary disease.   Electronically Signed   By: Sabino Dick M.D.   On: 04/21/2014 17:34   Ct Head Wo Contrast  04/21/2014   CLINICAL DATA:  Dizziness, nausea dizziness and nausea today  EXAM: CT HEAD WITHOUT CONTRAST  TECHNIQUE: Contiguous axial images were obtained from the base of the skull through the vertex without intravenous contrast.  COMPARISON:  None.  FINDINGS: No skull fracture is noted. Paranasal sinuses and mastoid air cells are unremarkable.  No intracranial hemorrhage, mass effect or midline shift. No acute cortical infarction. Mild cerebral atrophy. No mass lesion is noted on this unenhanced scan. Periventricular and patchy subcortical white matter decreased attenuation probable due to chronic small vessel ischemic changes.  IMPRESSION: No acute intracranial abnormality. Mild cerebral atrophy. Probable chronic periventricular and patchy subcortical white matter disease.   Electronically Signed   By: Lahoma Crocker M.D.   On: 04/21/2014 17:47   Ct Angio Chest Pe W/cm &/or Wo Cm  04/21/2014   CLINICAL DATA:  80 year old male with a syncopal episode and elevated D-dimer.  Evaluate for pulmonary embolism.  EXAM: CT ANGIOGRAPHY CHEST WITH CONTRAST  TECHNIQUE: Multidetector CT imaging of the chest was performed using the standard protocol during bolus administration of intravenous contrast. Multiplanar CT image reconstructions and MIPs were obtained to evaluate the vascular anatomy.  CONTRAST:  47mL OMNIPAQUE IOHEXOL 350 MG/ML SOLN  COMPARISON:  Chest x-ray obtained earlier today; prior chest CT 11/01/2008  FINDINGS: Mediastinum: Unremarkable CT appearance of the thyroid gland. No suspicious mediastinal or hilar adenopathy. No soft tissue mediastinal mass. Small hiatal hernia.  Heart/Vascular: The heart is within normal limits for size. No pericardial effusion. Status post multivessel coronary artery bypass grafting. The thoracic aorta is markedly tortuous but not aneurysmal. Scattered atherosclerotic vascular calcifications. Adequate opacification of the pulmonary arteries to the proximal subsegmental level. Evaluation of the segmental and subsegmental arteries is slightly limited by respiratory motion. No central filling defect to suggest acute pulmonary embolus.  Lungs/Pleura: Surgical changes of prior right upper lobectomy. No  pleural effusion. Mild combined paraseptal and centrilobular emphysema. Subpleural reticulation and early honeycombing with architectural distortion noted in the right lower lobe likely representing fibrotic change, or possibly post radiation fibrosis. Respiratory motion limits evaluation for small pulmonary nodules. No focal airspace consolidation. There is mild dependent atelectasis bilaterally.  Bones/Soft Tissues: No acute fracture or aggressive appearing lytic or blastic osseous lesion. Healed median sternotomy. Multilevel degenerative change throughout the spine.  Upper Abdomen: The visualized upper abdomen is unremarkable.  Review of the MIP images confirms the above findings.  IMPRESSION: 1. Negative for pulmonary embolus, pneumonia or other acute  cardiopulmonary process. 2. Surgical changes of prior right upper lobectomy. 3. Centrilobular and paraseptal emphysema with fibrotic change in the inferior periphery of the right lower lobe. 4. Surgical changes of prior multivessel CABG. 5. Highly tortuous but not aneurysmal thoracic aorta. 6. Mild dependent atelectasis bilaterally.   Electronically Signed   By: Jacqulynn Cadet M.D.   On: 04/21/2014 19:44     Code Status: Full, discussed with patient and daughter.  Family Communication: Pt at bedside and [employer].  [phone] Disposition Plan: Admit to observation for further evaluation     Review of Systems:  Constitutional: Negative for fever, chills and malaise/fatigue. Positive for diaphoresis.  HENT: Positive for hearing loss.  Negative for ear pain, nosebleeds, congestion, sore throat, neck pain, tinnitus and ear discharge.   Eyes: Negative for blurred vision, double vision, photophobia Respiratory: + for chronic DOE.  Negative for cough, hemoptysis, wheezing and stridor.   Cardiovascular: + for occasional chronic angina and claudication.  + for chronic leg swelling.  Negative for palpitations, orthopnea Gastrointestinal: Negative for nausea, vomiting and abdominal pain. Negative for heartburn, constipation, blood in stool and melena.  Genitourinary: + for difficulty initiating stream.  Negative for dysuria, urgency, frequency, hematuria and flank pain.  Musculoskeletal: + for knee pain, chronic and difficulty walking.  Negative for myalgias, back pain, and falls.  Skin: Negative for itching and rash.  Neurological: + for dizziness and passing out.  Negative for weakness. Negative for tremors, sensory change, speech change, focal weakness and headaches.     Past Medical History  Diagnosis Date  . Hypothyroidism   . 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  Marland Kitchen Hernia   . Bone spur     and fused vertebrae.  He   is treated by Dr.  Theda Sers.  Marland Kitchen CAD (coronary artery disease)     Cath March 2012. 95% left main stenosis. Patent LIMA to the LAD, patent saphenous vein graft to OM, patent saphenous vein graft to the right coronary artery.  Marland Kitchen PVD (peripheral vascular disease)     (90% left iliac stenosis)  . Aortic ectasia       no abdominal aortic aneurysm.  He is followed for this by   Dr. Curt Jews.  . Constipated   . Hypertension   . COPD (chronic obstructive pulmonary disease)   . Hyperlipidemia   . Abdominal aortic aneurysm     3.3 cm  . Pre-diabetes   . Vitamin D deficiency     Past Surgical History  Procedure Laterality Date  . Pneumonectomy    . Knee surgery    . Cataract extraction    . Pilonidal cyst / sinus excision    . Coronary artery bypass graft       2010 Median sternotomy, extracorporeal circulation,   coronary artery bypass graft surgery  x3 using a left internal mammary   artery graft to left anterior descending coronary artery, with a    saphenous vein graft to the intermediate coronary artery, and a   saphenous vein graft to the right coronary artery.  Endoscopic vein   harvesting from the left leg.     Social History:  reports that he quit smoking about 11 years ago. He does not have any smokeless tobacco history on file. He reports that he does not drink alcohol or use illicit drugs.  Allergies  Allergen Reactions  . Prednisone     Family History  Problem Relation Age of Onset  . Heart disease Mother   . Pneumonia Father     Prior to Admission medications   Medication Sig Start Date End Date Taking? Authorizing Provider  Cholecalciferol (VITAMIN D-3) 5000 UNITS TABS Take by mouth daily.    Yes Historical Provider, MD  Coenzyme Q10 400 MG CAPS Take 1 capsule by mouth daily.   Yes Historical Provider, MD  cyclobenzaprine (FLEXERIL) 10 MG tablet TAKE ONE-HALF TO ONE TABLET BY MOUTH THREE TIMES DAILY AS NEEDED FOR MUSCLE SPASM 06/07/13  Yes [name], MD  enalapril (VASOTEC) 20 MG  tablet TAKE ONE TABLET BY MOUTH DAILY 03/09/14  Yes Vicie Mutters, PA-C  fish oil-omega-3 fatty acids 1000 MG capsule Take 1 g by mouth daily.     Yes Historical Provider, MD  furosemide (LASIX) 40 MG tablet Take 40 mg by mouth daily. 03/29/13  Yes [name], MD  HYDROcodone-acetaminophen (NORCO) 5-325 MG per tablet Take 1 tablet by mouth every 6 (six) hours as needed for moderate pain. 01/09/14  Yes Vicie Mutters, PA-C  isosorbide mononitrate (IMDUR) 60 MG 24 hr tablet TAKE ONE AND ONE-HALF TABLETS BY MOUTH ONCE DAILY   Yes Minus Breeding, MD  levothyroxine (SYNTHROID, LEVOTHROID) 200 MCG tablet TAKE ONE TABLET BY MOUTH EVERY DAY 03/09/14  Yes Vicie Mutters, PA-C  MAGNESIUM PO Take 250 mg by mouth daily.    Yes Historical Provider, MD  Multiple Vitamin (MULTIVITAMIN) tablet Take 1 tablet by mouth daily.   Yes Historical Provider, MD  potassium chloride SA (K-DUR,KLOR-CON) 20 MEQ tablet Take 20 mEq by mouth daily.   Yes Historical Provider, MD  ramipril (ALTACE) 2.5 MG capsule Take 2.5 mg by mouth daily.   Yes Historical Provider, MD  triamcinolone cream (KENALOG) 0.1 % Apply 1 application topically 2 (two) times daily. 01/09/14  Yes Vicie Mutters, PA-C  nitroGLYCERIN (NITROSTAT) 0.4 MG SL tablet Place 1 tablet (0.4 mg total) under the tongue every 5 (five) minutes as needed. 10/25/12   Minus Breeding, MD    Physical Exam: Filed Vitals:   04/21/14 1815 04/21/14 1845 04/21/14 1900 04/21/14 2100  BP: 120/70 121/65 121/65 134/64  Pulse: 76 77 82 69  Temp:      TempSrc:      Resp: 16 14 15 18   SpO2: 98% 98% 96% 100%    Physical Exam  Constitutional: Appears well-developed and well-nourished. No distress.  HENT: Normocephalic. Oropharynx is clear and moist.  Eyes: Conjunctivae and EOM are normal. PERRLA, no scleral icterus.  Neck: Normal ROM. Neck supple.  CVS: RR, NR, S1/S2 +, no murmurs, no gallops.  Sternotomy scar well healed Pulmonary: Effort and breath sounds normal, wheezes,  rales.  Abdominal: Soft. BS +,  no distension, tenderness Musculoskeletal: ROM limited at knees,  no tenderness. 1+ non pitting edema to mid calves Lymphadenopathy: No lymphadenopathy noted, cervical Neuro: Alert. Normal muscle tone, coordination.  Strength is equal throughout.  No cranial nerve deficit. Skin: Skin is warm and dry. No rash noted. Not diaphoretic. No erythema. No pallor.  Psychiatric: Normal mood and affect. Behavior, judgment, thought content normal.   Labs on Admission:  Basic Metabolic Panel:  Recent Labs Lab 04/21/14 1702  NA 139  K 4.7  CL 103  CO2 21  GLUCOSE 120*  BUN 17  CREATININE 1.33  CALCIUM 9.4   Liver Function Tests:  Recent Labs Lab 04/21/14 1702  AST 18  ALT 17  ALKPHOS 79  BILITOT 0.4  PROT 6.6  ALBUMIN 3.6   CBC:  Recent Labs Lab 04/21/14 1702  WBC 8.5  NEUTROABS 4.6  HGB 12.6*  HCT 38.5*  MCV 90.2  PLT 201   Cardiac Enzymes:  Recent Labs Lab 04/21/14 1702  TROPONINI <0.30   BNP: Invalid input(s): POCBNP CBG:  Recent Labs Lab 04/21/14 1644  GLUCAP 101*    EKG: Normal sinus rhythm, PAC noted, no ST changes.   Gilles Chiquito, MD  [facility] Pager [phone]  If 7PM-7AM, please contact night-coverage [email or web address] Password [email or web address] 04/21/2014, 9:09 PM

## 2014-04-21 NOTE — ED Notes (Signed)
Per EMS: Pt at home, in work shed, became weak, hot, feels "not himself". Pt nauseated, a/o x4. Hx CABG, htn, R partial lung lobectomy. Sats 91% on RA. Exertional dyspnea.

## 2014-04-21 NOTE — Progress Notes (Signed)
Patient arrived on unit from ED, family at bedside, alert and oriented x4.  Vital signs stable.  Patient instructed to call for help prior to getting up.  Patient agreeable.  Will continue to monitor.

## 2014-04-22 DIAGNOSIS — I10 Essential (primary) hypertension: Secondary | ICD-10-CM | POA: Diagnosis not present

## 2014-04-22 DIAGNOSIS — R55 Syncope and collapse: Secondary | ICD-10-CM | POA: Diagnosis not present

## 2014-04-22 DIAGNOSIS — I517 Cardiomegaly: Secondary | ICD-10-CM

## 2014-04-22 DIAGNOSIS — I2510 Atherosclerotic heart disease of native coronary artery without angina pectoris: Secondary | ICD-10-CM | POA: Diagnosis not present

## 2014-04-22 DIAGNOSIS — N179 Acute kidney failure, unspecified: Secondary | ICD-10-CM | POA: Diagnosis not present

## 2014-04-22 LAB — TROPONIN I

## 2014-04-22 LAB — OSMOLALITY: OSMOLALITY: 293 mosm/kg (ref 275–300)

## 2014-04-22 LAB — BASIC METABOLIC PANEL
Anion gap: 11 (ref 5–15)
BUN: 16 mg/dL (ref 6–23)
CHLORIDE: 107 meq/L (ref 96–112)
CO2: 23 meq/L (ref 19–32)
CREATININE: 0.99 mg/dL (ref 0.50–1.35)
Calcium: 8.8 mg/dL (ref 8.4–10.5)
GFR calc Af Amer: 83 mL/min — ABNORMAL LOW (ref >90)
GFR calc non Af Amer: 71 mL/min — ABNORMAL LOW (ref >90)
GLUCOSE: 100 mg/dL — AB (ref 70–99)
Potassium: 4.3 mEq/L (ref 3.7–5.3)
Sodium: 141 mEq/L (ref 137–147)

## 2014-04-22 LAB — OSMOLALITY, URINE: Osmolality, Ur: 424 mOsm/kg (ref 390–1090)

## 2014-04-22 LAB — NA AND K (SODIUM & POTASSIUM), RAND UR
POTASSIUM UR: 26 meq/L
SODIUM UR: 63 meq/L

## 2014-04-22 LAB — TSH: TSH: 2.18 u[IU]/mL (ref 0.350–4.500)

## 2014-04-22 MED ORDER — SODIUM CHLORIDE 0.9 % IV SOLN: INTRAVENOUS | Status: DC

## 2014-04-22 NOTE — Plan of Care (Signed)
Problem: Consults Goal: Skin Care Protocol Initiated - if Braden Score 18 or less If consults are not indicated, leave blank or document N/A  Outcome: Not Applicable Date Met:  50/41/36 Goal: Nutrition Consult-if indicated Outcome: Not Applicable Date Met:  43/83/77  Problem: Phase I Progression Outcomes Goal: Pain controlled with appropriate interventions Outcome: Completed/Met Date Met:  04/22/14 Goal: Voiding-avoid urinary catheter unless indicated Outcome: Completed/Met Date Met:  04/22/14 Goal: Other Phase I Outcomes/Goals Outcome: Not Applicable Date Met:  93/96/88

## 2014-04-22 NOTE — Progress Notes (Signed)
PT Cancellation Note  Patient Details Name: [name] MRN: [id number] DOB: 04-Sep-1924   Cancelled Treatment:    Reason Eval/Treat Not Completed: Other (comment) (pt eating and deferred at this time)   Melford Aase 04/22/2014, 12:32 PM Elwyn Reach, [place]

## 2014-04-22 NOTE — Evaluation (Signed)
Physical Therapy Evaluation Patient Details Name: [name] MRN: [id number] DOB: 07-23-1924 Today's Date: 04/22/2014   History of Present Illness  [name] is an 80yo man with PMH of CAD s/p CABG, PVD, HTN, hypothyroidism, HLD, chronic angina, vitamin d deficiency, COPD with h/o lung cancer s/p resection > 5 years ago, Grade 1 diastolic dysfunction (TTE 2011) who presented with syncope. Bil LE edema  Clinical Impression  [name] is a very pleasant man who enjoys joking. He reports he has had multiple falls with wife unable to help him up so she will gradually stack books under him until he can stand if no one else if available to call for assist. Pt with difficulty achieving standing, poor balance and need for increased DME support in standing. Recommend RW use for gait with assist for mobility and supervision for all mobility, pt denies SNF but agreeable to HHPT. Will follow acutely to maximize transfers, balance, and gait to decrease fall risk and burden of care.     Follow Up Recommendations Home health PT;Supervision for mobility/OOB    Equipment Recommendations  Other (comment) (tub bench)    Recommendations for Other Services OT consult     Precautions / Restrictions Precautions Precautions: Fall Precaution Comments: pt reports multiple falls at home      Mobility  Bed Mobility Overal bed mobility: Modified Independent             General bed mobility comments: increased time with dependence on rail   Transfers Overall transfer level: Needs assistance   Transfers: Sit to/from Stand Sit to Stand: Min assist         General transfer comment: 4 trials before pt able to achieve standing with use of momentum and cues for hand placement with assist for anterior translation  Ambulation/Gait Ambulation/Gait assistance: Min guard Ambulation Distance (Feet): 100 Feet Assistive device: Rolling walker (2 wheeled) Gait Pattern/deviations: Step-through pattern;Decreased  stride length;Trunk flexed   Gait velocity interpretation: Below normal speed for age/gender General Gait Details: cues for posture and position in RW  Stairs            Wheelchair Mobility    Modified Rankin (Stroke Patients Only)       Balance Overall balance assessment: Needs assistance   Sitting balance-[name] Scale: Fair       Standing balance-[name] Scale: Poor                               Pertinent Vitals/Pain Pain Assessment: No/denies pain    Home Living Family/patient expects to be discharged to:: Private residence Living Arrangements: Spouse/significant other Available Help at Discharge: Family;Available PRN/intermittently Type of Home: House       Home Layout: Two level Home Equipment: [place] - 2 wheels;Walker - 4 wheels;Cane - single point;Electric scooter      Prior Function Level of Independence: Independent with assistive device(s)   Gait / Transfers Assistance Needed: pt reports he generally has difficulty standing, uses a cane for ambulation. will use scooter for long distance ambulation  ADL's / Homemaking Assistance Needed: stands in the shower with his feet braced against tub and head against the shower wall to prevent fall with use of long handled sponge        Hand Dominance        Extremity/Trunk Assessment   Upper Extremity Assessment: Generalized weakness           Lower Extremity Assessment: Generalized  weakness (bil LE edema with hx of L TKA and Right knee arthritis)      Cervical / Trunk Assessment: Kyphotic  Communication   Communication: HOH (deaf in left ear)  Cognition Arousal/Alertness: Awake/alert Behavior During Therapy: WFL for tasks assessed/performed Overall Cognitive Status: Impaired/Different from baseline Area of Impairment: Safety/judgement         Safety/Judgement: Decreased awareness of safety          General Comments      Exercises        Assessment/Plan    PT  Assessment Patient needs continued PT services  PT Diagnosis Difficulty walking;Generalized weakness   PT Problem List Decreased strength;Decreased activity tolerance;Decreased balance;Decreased mobility;Decreased safety awareness;Decreased knowledge of use of DME;Obesity  PT Treatment Interventions Gait training;Functional mobility training;Therapeutic activities;Patient/family education;DME instruction   PT Goals (Current goals can be found in the Care Plan section) Acute Rehab PT Goals Patient Stated Goal: return home PT Goal Formulation: With patient Time For Goal Achievement: 05/06/14 Potential to Achieve Goals: Fair    Frequency Min 3X/week   Barriers to discharge Decreased caregiver support      Co-evaluation               End of Session Equipment Utilized During Treatment: Gait belt Activity Tolerance: Patient tolerated treatment well Patient left: in chair;with call bell/phone within reach Nurse Communication: Mobility status;Precautions         Time: 7048-8891 PT Time Calculation (min) (ACUTE ONLY): 21 min   Charges:   PT Evaluation $Initial PT Evaluation Tier I: 1 Procedure PT Treatments $Therapeutic Activity: 8-22 mins   PT G CodesMelford Aase 04/22/2014, 2:36 PM Elwyn Reach, [place]

## 2014-04-22 NOTE — Progress Notes (Signed)
Patient Demographics  [name], is a 80 y.o. male, DOB - Aug 06, 1924, MBW:[id number]  Admit date - 04/21/2014   Admitting Physician Sid Falcon, MD  Outpatient Primary MD for the patient is Alesia Richards, MD  LOS - 1   Chief Complaint  Patient presents with  . Shortness of Breath      Admission history of present illness/brief narrative: [name] is an 80yo man with PMH of CAD s/p CABG, PVD, HTN, hypothyroidism, HLD, chronic angina, vitamin d deficiency, COPD with h/o lung cancer s/p resection > 5 years ago, Grade 1 diastolic dysfunction (TTE 2011) who presented with syncope. Further symptoms include diaphoresis, dizziness. He denies chest pain, increased SOB (DOE at baseline), recent fever, chills, nausea, vomiting, diarrhea, orthopnea, blurry vision, seizure like activity, palpitations. He has chronic LE swelling.He only takes his lasix every few days b/c he does not want to have to go the bathroom overnight. He reports that he has been eating and drinking normally; yesterday, on Thanksgiving, they had a large spread including regular Thanksgiving food.  Patient workup was significant for mild dehydration, and renal failure, otherwise chest x-ray and urinalysis did not show any acute findings, CT chest angiogram was negative for PE, CT head did not show any acute intracranial finding, patient was not orthostatic.  Subjective:   [name] today has, No headache, No chest pain, No abdominal pain - No Nausea, No new weakness tingling or numbness, No Cough - SOB.   Assessment & Plan    Principal Problem:   Syncope Active Problems:   Hypothyroidism   Hyperlipidemia   Coronary atherosclerosis   PVD (peripheral vascular disease) with claudication   Essential hypertension   Vitamin D deficiency   COPD (chronic obstructive pulmonary disease)   AKI  (acute kidney injury)  Syncope -Most likely related to dehydration and mild renal failure. -Continue to hold Lasix and continue with gentle hydration. -CTA chest angiogram is negative for PE. -CT head does not show any acute findings -No acute events on telemetry -TSH within normal limits -Troponins negative 2 -2-D echo still pending -Patient is not   Hypothyroidism - TSH within normal limits, continue home synthroid   Hyperlipidemia - Take fish oil. He does not like the idea of statins   Coronary atherosclerosis - Currently no chest pain and he has not needed NTG in "a while."  - Hold lasix and ACE-I as noted above - Continue imdur - Per home meds, he is not on a beta blocker.    PVD (peripheral vascular disease) with claudication - Chronic pain due to this and knee atherosclerosis - Continue home pain medications including flexeril and norco   Essential hypertension - BP has been lower, holding ACE-I and lasix due to above - Monitor for increase BP and start a PRN medication as needed   Vitamin D deficiency - On home vitamin D, resume on discharge   COPD (chronic obstructive pulmonary disease) - Not on any inhalers, h/o surgery for lung cancer - On O2 at night, will be ordered while here in the hospital.    Code Status: Full  Family Communication: No one at bedside  Disposition Plan: Home when stable, will consult PT/OT   Procedures  None  Consults   None   Medications  Scheduled Meds: . heparin  5,000 Units Subcutaneous 3 times per day  . isosorbide mononitrate  90 mg Oral Daily  . levothyroxine  200 mcg Oral QAC breakfast  . sodium chloride  3 mL Intravenous Q12H   Continuous Infusions:  PRN Meds:.cyclobenzaprine, HYDROcodone-acetaminophen, nitroGLYCERIN, ondansetron **OR** ondansetron (ZOFRAN) IV  DVT Prophylaxis  Lovenox -  Lab Results  Component Value Date   PLT 201 04/21/2014    Antibiotics    Anti-infectives     None          Objective:   Filed Vitals:   04/21/14 2200 04/21/14 2210 04/22/14 0150 04/22/14 0419  BP: 141/69  128/63 139/72  Pulse: 76  78 68  Temp: 98.2 F (36.8 C)  98 F (36.7 C) 97.5 F (36.4 C)  TempSrc: Oral  Oral Oral  Resp: 16  18 16   Height:  5\' 11"  (1.803 m)    Weight: 116.484 kg (256 lb 12.8 oz)   116.46 kg (256 lb 12 oz)  SpO2: 100%  100% 96%    Wt Readings from Last 3 Encounters:  04/22/14 116.46 kg (256 lb 12 oz)  02/01/14 117.935 kg (260 lb)  01/09/14 117.935 kg (260 lb)     Intake/Output Summary (Last 24 hours) at 04/22/14 1100 Last data filed at 04/22/14 0956  Gross per 24 hour  Intake  905.5 ml  Output   1375 ml  Net -469.5 ml     Physical Exam  Awake Alert, Oriented X 3, No new F.N deficits, Normal affect [place].AT,PERRAL Supple Neck,No JVD, No cervical lymphadenopathy appriciated.  Symmetrical Chest wall movement, Good air movement bilaterally, CTAB RRR,No Gallops,Rubs or new Murmurs, No Parasternal Heave +ve B.Sounds, Abd Soft, No tenderness, No organomegaly appriciated, No rebound - guarding or rigidity. No Cyanosis, Clubbing , No new Rash or bruise  , has mild pitting edema.   Data Review   Micro Results No results found for this or any previous visit (from the past 240 hour(s)).  Radiology Reports Dg Chest 2 View  04/21/2014   CLINICAL DATA:  Shortness of breath.  EXAM: CHEST  2 VIEW  COMPARISON:  October 31, 2013.  FINDINGS: Stable cardiomediastinal silhouette. Sternotomy wires are noted. No pneumothorax or significant pleural effusion is noted. No acute pulmonary disease is noted. Patient is rotated to the left. Degenerative changes seen involving the acromioclavicular joints bilaterally.  IMPRESSION: No active cardiopulmonary disease.   Electronically Signed   By: Sabino Dick M.D.   On: 04/21/2014 17:34   Ct Head Wo Contrast  04/21/2014   CLINICAL DATA:  Dizziness, nausea dizziness and nausea today  EXAM: CT HEAD WITHOUT CONTRAST   TECHNIQUE: Contiguous axial images were obtained from the base of the skull through the vertex without intravenous contrast.  COMPARISON:  None.  FINDINGS: No skull fracture is noted. Paranasal sinuses and mastoid air cells are unremarkable.  No intracranial hemorrhage, mass effect or midline shift. No acute cortical infarction. Mild cerebral atrophy. No mass lesion is noted on this unenhanced scan. Periventricular and patchy subcortical white matter decreased attenuation probable due to chronic small vessel ischemic changes.  IMPRESSION: No acute intracranial abnormality. Mild cerebral atrophy. Probable chronic periventricular and patchy subcortical white matter disease.   Electronically Signed   By: Lahoma Crocker M.D.   On: 04/21/2014 17:47   Ct Angio Chest Pe W/cm &/or Wo Cm  04/21/2014   CLINICAL DATA:  80 year old male with a syncopal episode and  elevated D-dimer. Evaluate for pulmonary embolism.  EXAM: CT ANGIOGRAPHY CHEST WITH CONTRAST  TECHNIQUE: Multidetector CT imaging of the chest was performed using the standard protocol during bolus administration of intravenous contrast. Multiplanar CT image reconstructions and MIPs were obtained to evaluate the vascular anatomy.  CONTRAST:  3mL OMNIPAQUE IOHEXOL 350 MG/ML SOLN  COMPARISON:  Chest x-ray obtained earlier today; prior chest CT 11/01/2008  FINDINGS: Mediastinum: Unremarkable CT appearance of the thyroid gland. No suspicious mediastinal or hilar adenopathy. No soft tissue mediastinal mass. Small hiatal hernia.  Heart/Vascular: The heart is within normal limits for size. No pericardial effusion. Status post multivessel coronary artery bypass grafting. The thoracic aorta is markedly tortuous but not aneurysmal. Scattered atherosclerotic vascular calcifications. Adequate opacification of the pulmonary arteries to the proximal subsegmental level. Evaluation of the segmental and subsegmental arteries is slightly limited by respiratory motion. No central  filling defect to suggest acute pulmonary embolus.  Lungs/Pleura: Surgical changes of prior right upper lobectomy. No pleural effusion. Mild combined paraseptal and centrilobular emphysema. Subpleural reticulation and early honeycombing with architectural distortion noted in the right lower lobe likely representing fibrotic change, or possibly post radiation fibrosis. Respiratory motion limits evaluation for small pulmonary nodules. No focal airspace consolidation. There is mild dependent atelectasis bilaterally.  Bones/Soft Tissues: No acute fracture or aggressive appearing lytic or blastic osseous lesion. Healed median sternotomy. Multilevel degenerative change throughout the spine.  Upper Abdomen: The visualized upper abdomen is unremarkable.  Review of the MIP images confirms the above findings.  IMPRESSION: 1. Negative for pulmonary embolus, pneumonia or other acute cardiopulmonary process. 2. Surgical changes of prior right upper lobectomy. 3. Centrilobular and paraseptal emphysema with fibrotic change in the inferior periphery of the right lower lobe. 4. Surgical changes of prior multivessel CABG. 5. Highly tortuous but not aneurysmal thoracic aorta. 6. Mild dependent atelectasis bilaterally.   Electronically Signed   By: Jacqulynn Cadet M.D.   On: 04/21/2014 19:44    CBC  Recent Labs Lab 04/21/14 1702  WBC 8.5  HGB 12.6*  HCT 38.5*  PLT 201  MCV 90.2  MCH 29.5  MCHC 32.7  RDW 14.4  LYMPHSABS 2.9  MONOABS 0.8  EOSABS 0.2  BASOSABS 0.1    Chemistries   Recent Labs Lab 04/21/14 1702 04/22/14 0255  NA 139 141  K 4.7 4.3  CL 103 107  CO2 21 23  GLUCOSE 120* 100*  BUN 17 16  CREATININE 1.33 0.99  CALCIUM 9.4 8.8  AST 18  --   ALT 17  --   ALKPHOS 79  --   BILITOT 0.4  --    ------------------------------------------------------------------------------------------------------------------ estimated creatinine clearance is 68.3 mL/min (by C-G formula based on Cr of  0.99). ------------------------------------------------------------------------------------------------------------------ No results for input(s): HGBA1C in the last 72 hours. ------------------------------------------------------------------------------------------------------------------ No results for input(s): CHOL, HDL, LDLCALC, TRIG, CHOLHDL, LDLDIRECT in the last 72 hours. ------------------------------------------------------------------------------------------------------------------  Recent Labs  04/22/14 0255  TSH 2.180   ------------------------------------------------------------------------------------------------------------------ No results for input(s): VITAMINB12, FOLATE, FERRITIN, TIBC, IRON, RETICCTPCT in the last 72 hours.  Coagulation profile  Recent Labs Lab 04/21/14 1702  INR 1.03     Recent Labs  04/21/14 1702  DDIMER 1.27*    Cardiac Enzymes  Recent Labs Lab 04/21/14 1702 04/22/14 0255  TROPONINI <0.30 <0.30   ------------------------------------------------------------------------------------------------------------------ Invalid input(s): POCBNP     Time Spent in minutes   30 minutes   Jeslie Lowe M.D on 04/22/2014 at 11:00 AM  Between 7am to 7pm - Pager - [phone]  After 7pm go to [email or web address] - password [email or web address]  And look for the night coverage person covering for me after hours  [facility] Office  [phone]   **Disclaimer: This note may have been dictated with voice recognition software. Similar sounding words can inadvertently be transcribed and this note may contain transcription errors which may not have been corrected upon publication of note.**

## 2014-04-22 NOTE — Progress Notes (Signed)
UR completed 

## 2014-04-22 NOTE — Progress Notes (Signed)
Echo Lab  2D Echocardiogram completed.  [place], RDCS 04/22/2014 11:16 AM

## 2014-04-23 DIAGNOSIS — R55 Syncope and collapse: Secondary | ICD-10-CM | POA: Diagnosis not present

## 2014-04-23 LAB — BASIC METABOLIC PANEL
ANION GAP: 13 (ref 5–15)
BUN: 15 mg/dL (ref 6–23)
CHLORIDE: 105 meq/L (ref 96–112)
CO2: 22 mEq/L (ref 19–32)
CREATININE: 0.86 mg/dL (ref 0.50–1.35)
Calcium: 8.8 mg/dL (ref 8.4–10.5)
GFR, EST AFRICAN AMERICAN: 88 mL/min — AB (ref >90)
GFR, EST NON AFRICAN AMERICAN: 76 mL/min — AB (ref >90)
Glucose, Bld: 96 mg/dL (ref 70–99)
Potassium: 4.1 mEq/L (ref 3.7–5.3)
Sodium: 140 mEq/L (ref 137–147)

## 2014-04-23 LAB — CBC
HCT: 35 % — ABNORMAL LOW (ref 39.0–52.0)
Hemoglobin: 11.2 g/dL — ABNORMAL LOW (ref 13.0–17.0)
MCH: 27.9 pg (ref 26.0–34.0)
MCHC: 32 g/dL (ref 30.0–36.0)
MCV: 87.1 fL (ref 78.0–100.0)
PLATELETS: 177 10*3/uL (ref 150–400)
RBC: 4.02 MIL/uL — ABNORMAL LOW (ref 4.22–5.81)
RDW: 14.4 % (ref 11.5–15.5)
WBC: 6.2 10*3/uL (ref 4.0–10.5)

## 2014-04-23 MED ORDER — FUROSEMIDE 40 MG PO TABS: 20.0000 mg | ORAL_TABLET | Freq: Every day | ORAL | Status: DC

## 2014-04-23 NOTE — Progress Notes (Signed)
Pt. discharged home given discharged instructions verbalized understanding, family at the bedside questions answered. IV and telemetry discontinued. Pt wheeled to main entrance.

## 2014-04-23 NOTE — Progress Notes (Signed)
Utilization Review Completed.   Chloeann Alfred, RN, BSN Nurse Case Manager  

## 2014-04-23 NOTE — Plan of Care (Signed)
Problem: Phase II Progression Outcomes Goal: Vital signs remain stable Outcome: Completed/Met Date Met:  04/23/14 Goal: IV changed to normal saline lock Outcome: Completed/Met Date Met:  04/23/14

## 2014-04-23 NOTE — Discharge Instructions (Signed)
Follow with Primary MD MCKEOWN,WILLIAM DAVID, MD in 7 days   Get CBC, CMP, 2 view Chest X ray checked  by Primary MD next visit.    Activity: As tolerated with Full fall precautions use walker/cane & assistance as needed   Disposition Home with home care   Diet: Heart Healthy  , with feeding assistance and aspiration precautions as needed. Agent is encouraged to have good fluid intake.  For Heart failure patients - Check your Weight same time everyday, if you gain over 2 pounds, or you develop in leg swelling, experience more shortness of breath or chest pain, call your Primary MD immediately.    On your next visit with your primary care physician please Get Medicines reviewed and adjusted.   Please request your Prim.MD to go over all Hospital Tests and Procedure/Radiological results at the follow up, please get all Hospital records sent to your Prim MD by signing hospital release before you go home.   If you experience worsening of your admission symptoms, develop shortness of breath, life threatening emergency, suicidal or homicidal thoughts you must seek medical attention immediately by calling 911 or calling your MD immediately  if symptoms less severe.  You Must read complete instructions/literature along with all the possible adverse reactions/side effects for all the Medicines you take and that have been prescribed to you. Take any new Medicines after you have completely understood and accpet all the possible adverse reactions/side effects.   Do not drive, operating heavy machinery, perform activities at heights, swimming or participation in water activities or provide baby sitting services if your were admitted for syncope or siezures until you have seen by Primary MD or a Neurologist and advised to do so again.  Do not drive when taking Pain medications.    Do not take more than prescribed Pain, Sleep and Anxiety Medications  Special Instructions: If you have smoked or  chewed Tobacco  in the last 2 yrs please stop smoking, stop any regular Alcohol  and or any Recreational drug use.  Wear Seat belts while driving.   Please note  You were cared for by a hospitalist during your hospital stay. If you have any questions about your discharge medications or the care you received while you were in the hospital after you are discharged, you can call the unit and asked to speak with the hospitalist on call if the hospitalist that took care of you is not available. Once you are discharged, your primary care physician will handle any further medical issues. Please note that NO REFILLS for any discharge medications will be authorized once you are discharged, as it is imperative that you return to your primary care physician (or establish a relationship with a primary care physician if you do not have one) for your aftercare needs so that they can reassess your need for medications and monitor your lab values.

## 2014-04-23 NOTE — Discharge Summary (Signed)
[name], 80 y.o., DOB 1925/01/13, MRN [id number]. Admission date: 04/21/2014 Discharge Date 04/23/2014 Primary MD Alesia Richards, MD Admitting Physician Sid Falcon, MD  Admission Diagnosis  Syncope [R55] Syncope, unspecified syncope type [R55]  Discharge Diagnosis   Principal Problem:   Syncope Active Problems:   Hypothyroidism   Hyperlipidemia   Coronary atherosclerosis   PVD (peripheral vascular disease) with claudication   Essential hypertension   Vitamin D deficiency   COPD (chronic obstructive pulmonary disease)   AKI (acute kidney injury)      Past Medical History  Diagnosis Date  . Hypothyroidism   . 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  Marland Kitchen Hernia   . Bone spur     and fused vertebrae.  He   is treated by Dr. Theda Sers.  Marland Kitchen CAD (coronary artery disease)     Cath March 2012. 95% left main stenosis. Patent LIMA to the LAD, patent saphenous vein graft to OM, patent saphenous vein graft to the right coronary artery.  Marland Kitchen PVD (peripheral vascular disease)     (90% left iliac stenosis)  . Aortic ectasia       no abdominal aortic aneurysm.  He is followed for this by   Dr. Curt Jews.  . Constipated   . Hypertension   . COPD (chronic obstructive pulmonary disease)   . Hyperlipidemia   . Abdominal aortic aneurysm     3.3 cm  . Pre-diabetes   . Vitamin D deficiency     Past Surgical History  Procedure Laterality Date  . Pneumonectomy    . Knee surgery    . Cataract extraction    . Pilonidal cyst / sinus excision    . Coronary artery bypass graft       2010 Median sternotomy, extracorporeal circulation,   coronary artery bypass graft surgery x3 using a left internal mammary   artery graft to left anterior descending coronary artery, with a    saphenous vein graft to the intermediate coronary artery, and a   saphenous vein graft to the right coronary artery.  Endoscopic vein   harvesting from the left leg.      Admission history of present illness/brief narrative: [name] is an 80yo man with PMH of CAD s/p CABG, PVD, HTN, hypothyroidism, HLD, chronic angina, vitamin d deficiency, COPD with h/o lung cancer s/p resection > 5 years ago, Grade 1 diastolic dysfunction (TTE 2011) who presented with syncope. Further symptoms include diaphoresis, dizziness. He denies chest pain, increased SOB (DOE at baseline), recent fever, chills, nausea, vomiting, diarrhea, orthopnea, blurry vision, seizure like activity, palpitations. He has chronic LE swelling.He only takes his lasix every few days b/c he does not want to have to go the bathroom overnight. He reports that he has been eating and drinking normally; yesterday, on Thanksgiving, they had a large spread including regular Thanksgiving food.  Patient workup was significant for mild dehydration, and renal failure, otherwise chest x-ray and urinalysis did not show any acute findings, CT chest angiogram was negative for PE, CT head did not show any acute intracranial finding, patient was not orthostatic. 2-D echo was done and did show ejection fraction of 65%, and grade 1 diastolic dysfunction. As well patient had no significant events on telemetry or in his hospital stay.  Hospital Course See H&P, Labs, Consult and Test reports for all details in brief, patient was admitted for   Principal Problem:   Syncope  Active Problems:   Hypothyroidism   Hyperlipidemia   Coronary atherosclerosis   PVD (peripheral vascular disease) with claudication   Essential hypertension   Vitamin D deficiency   COPD (chronic obstructive pulmonary disease)   AKI (acute kidney injury)  Syncope -Most likely related to dehydration and mild renal failure. Renal failure has resolved, creatinine is 0.86 at day of discharge, and has no further signs of dehydration. - Will resume back on Lasix but will decrease the dose from 40 mg oral daily to 20 mg oral daily. -CTA chest angiogram  is negative for PE. -CT head does not show any acute findings -No acute events on telemetry -TSH within normal limits -Troponins negative 2 -2-D echo showing EF of 65%, and grade 1 diastolic dysfunction. -Patient is not   Hypothyroidism - TSH within normal limits, continue home synthroid   Hyperlipidemia - Take fish oil.   Coronary atherosclerosis - Had no chest pain during his hospital stay. and he has not needed NTG in "a while."  - Resume back on lisinopril and Imdur    PVD (peripheral vascular disease) with claudication - Chronic pain due to this and knee atherosclerosis - Continue home pain medications including flexeril and norco   Essential hypertension -Initially blood pressure was on the lower side, but after appropriate hydration was increased, so he is resume back on his lisinopril, and lower dose Lasix.   Vitamin D deficiency - On home vitamin D, resumed on discharge   COPD (chronic obstructive pulmonary disease) - Not on any inhalers, h/o surgery for lung cancer - On O2 at night, to be continued as outpatient.     Significant Tests:  See full reports for all details    Dg Chest 2 View  04/21/2014   CLINICAL DATA:  Shortness of breath.  EXAM: CHEST  2 VIEW  COMPARISON:  October 31, 2013.  FINDINGS: Stable cardiomediastinal silhouette. Sternotomy wires are noted. No pneumothorax or significant pleural effusion is noted. No acute pulmonary disease is noted. Patient is rotated to the left. Degenerative changes seen involving the acromioclavicular joints bilaterally.  IMPRESSION: No active cardiopulmonary disease.   Electronically Signed   By: Sabino Dick M.D.   On: 04/21/2014 17:34   Ct Head Wo Contrast  04/21/2014   CLINICAL DATA:  Dizziness, nausea dizziness and nausea today  EXAM: CT HEAD WITHOUT CONTRAST  TECHNIQUE: Contiguous axial images were obtained from the base of the skull through the vertex without intravenous contrast.  COMPARISON:  None.   FINDINGS: No skull fracture is noted. Paranasal sinuses and mastoid air cells are unremarkable.  No intracranial hemorrhage, mass effect or midline shift. No acute cortical infarction. Mild cerebral atrophy. No mass lesion is noted on this unenhanced scan. Periventricular and patchy subcortical white matter decreased attenuation probable due to chronic small vessel ischemic changes.  IMPRESSION: No acute intracranial abnormality. Mild cerebral atrophy. Probable chronic periventricular and patchy subcortical white matter disease.   Electronically Signed   By: Lahoma Crocker M.D.   On: 04/21/2014 17:47   Ct Angio Chest Pe W/cm &/or Wo Cm  04/21/2014   CLINICAL DATA:  80 year old male with a syncopal episode and elevated D-dimer. Evaluate for pulmonary embolism.  EXAM: CT ANGIOGRAPHY CHEST WITH CONTRAST  TECHNIQUE: Multidetector CT imaging of the chest was performed using the standard protocol during bolus administration of intravenous contrast. Multiplanar CT image reconstructions and MIPs were obtained to evaluate the vascular anatomy.  CONTRAST:  48mL OMNIPAQUE IOHEXOL 350 MG/ML SOLN  COMPARISON:  Chest x-ray obtained earlier today; prior chest CT 11/01/2008  FINDINGS: Mediastinum: Unremarkable CT appearance of the thyroid gland. No suspicious mediastinal or hilar adenopathy. No soft tissue mediastinal mass. Small hiatal hernia.  Heart/Vascular: The heart is within normal limits for size. No pericardial effusion. Status post multivessel coronary artery bypass grafting. The thoracic aorta is markedly tortuous but not aneurysmal. Scattered atherosclerotic vascular calcifications. Adequate opacification of the pulmonary arteries to the proximal subsegmental level. Evaluation of the segmental and subsegmental arteries is slightly limited by respiratory motion. No central filling defect to suggest acute pulmonary embolus.  Lungs/Pleura: Surgical changes of prior right upper lobectomy. No pleural effusion. Mild combined  paraseptal and centrilobular emphysema. Subpleural reticulation and early honeycombing with architectural distortion noted in the right lower lobe likely representing fibrotic change, or possibly post radiation fibrosis. Respiratory motion limits evaluation for small pulmonary nodules. No focal airspace consolidation. There is mild dependent atelectasis bilaterally.  Bones/Soft Tissues: No acute fracture or aggressive appearing lytic or blastic osseous lesion. Healed median sternotomy. Multilevel degenerative change throughout the spine.  Upper Abdomen: The visualized upper abdomen is unremarkable.  Review of the MIP images confirms the above findings.  IMPRESSION: 1. Negative for pulmonary embolus, pneumonia or other acute cardiopulmonary process. 2. Surgical changes of prior right upper lobectomy. 3. Centrilobular and paraseptal emphysema with fibrotic change in the inferior periphery of the right lower lobe. 4. Surgical changes of prior multivessel CABG. 5. Highly tortuous but not aneurysmal thoracic aorta. 6. Mild dependent atelectasis bilaterally.   Electronically Signed   By: Jacqulynn Cadet M.D.   On: 04/21/2014 19:44     Today   Subjective:   [name] today has no headache,no chest abdominal pain,no new weakness tingling or numbness, feels much better wants to go home today.   Objective:   Blood pressure 147/82, pulse 70, temperature 98.6 F (37 C), temperature source Oral, resp. rate 18, height 5\' 11"  (1.803 m), weight 115.577 kg (254 lb 12.8 oz), SpO2 95 %.  Intake/Output Summary (Last 24 hours) at 04/23/14 1245 Last data filed at 04/23/14 1114  Gross per 24 hour  Intake    480 ml  Output   2600 ml  Net  -2120 ml    Exam Awake Alert, Oriented *3, No new F.N deficits, Normal affect [place].AT,PERRAL Supple Neck,No JVD, No cervical lymphadenopathy appriciated.  Symmetrical Chest wall movement, Good air movement bilaterally, CTAB RRR,No Gallops,Rubs or new Murmurs, No Parasternal  Heave +ve B.Sounds, Abd Soft, Non tender, No organomegaly appriciated, No rebound -guarding or rigidity. No Cyanosis, Clubbing or edema, No new Rash or bruise  Data Review     CBC w Diff: Lab Results  Component Value Date   WBC 6.2 04/23/2014   HGB 11.2* 04/23/2014   HCT 35.0* 04/23/2014   PLT 177 04/23/2014   LYMPHOPCT 34 04/21/2014   MONOPCT 9 04/21/2014   EOSPCT 2 04/21/2014   BASOPCT 1 04/21/2014   CMP: Lab Results  Component Value Date   NA 140 04/23/2014   K 4.1 04/23/2014   CL 105 04/23/2014   CO2 22 04/23/2014   BUN 15 04/23/2014   CREATININE 0.86 04/23/2014   CREATININE 0.90 02/01/2014   PROT 6.6 04/21/2014   ALBUMIN 3.6 04/21/2014   BILITOT 0.4 04/21/2014   ALKPHOS 79 04/21/2014   AST 18 04/21/2014   ALT 17 04/21/2014  .  Micro Results No results found for this or any previous visit (from the past 240 hour(s)).   Discharge Instructions  Follow-up Information    Follow up with MCKEOWN,WILLIAM DAVID, MD. Call in 1 week.   Specialty:  Internal Medicine   Contact information:   [address] [place] [address] [phone]       Discharge Medications     Medication List    STOP taking these medications        ramipril 2.5 MG capsule  Commonly known as:  ALTACE      TAKE these medications        Coenzyme Q10 400 MG Caps  Take 1 capsule by mouth daily.     cyclobenzaprine 10 MG tablet  Commonly known as:  FLEXERIL  TAKE ONE-HALF TO ONE TABLET BY MOUTH THREE TIMES DAILY AS NEEDED FOR MUSCLE SPASM     enalapril 20 MG tablet  Commonly known as:  VASOTEC  TAKE ONE TABLET BY MOUTH DAILY     fish oil-omega-3 fatty acids 1000 MG capsule  Take 1 g by mouth daily.     furosemide 40 MG tablet  Commonly known as:  LASIX  Take 0.5 tablets (20 mg total) by mouth daily.     HYDROcodone-acetaminophen 5-325 MG per tablet  Commonly known as:  NORCO  Take 1 tablet by mouth every 6 (six) hours as needed for moderate pain.      isosorbide mononitrate 60 MG 24 hr tablet  Commonly known as:  IMDUR  TAKE ONE AND ONE-HALF TABLETS BY MOUTH ONCE DAILY     levothyroxine 200 MCG tablet  Commonly known as:  SYNTHROID, LEVOTHROID  TAKE ONE TABLET BY MOUTH EVERY DAY     MAGNESIUM PO  Take 250 mg by mouth daily.     multivitamin tablet  Take 1 tablet by mouth daily.     nitroGLYCERIN 0.4 MG SL tablet  Commonly known as:  NITROSTAT  Place 1 tablet (0.4 mg total) under the tongue every 5 (five) minutes as needed.     potassium chloride SA 20 MEQ tablet  Commonly known as:  K-DUR,KLOR-CON  Take 20 mEq by mouth daily.     triamcinolone cream 0.1 %  Commonly known as:  KENALOG  Apply 1 application topically 2 (two) times daily.     Vitamin D-3 5000 UNITS Tabs  Take by mouth daily.         Total Time in preparing paper work, data evaluation and todays exam - 35 minutes  Alfonzo Arca M.D on 04/23/2014 at 12:45 PM  [place]  [phone]

## 2014-04-23 NOTE — Care Management Note (Signed)
    Page 1 of 2   04/23/2014     3:33:21 PM CARE MANAGEMENT NOTE 04/23/2014  Patient:  [name],[name]   Account Number:  [id number]  Date Initiated:  04/23/2014  Documentation initiated by:  [facility]  Subjective/Objective Assessment:   adm: Shortness of Breath     Action/Plan:   discharge planning   Anticipated DC Date:  04/23/2014   Anticipated DC Plan:  [place]  CM consult      [facility] Choice  HOME HEALTH   Choice offered to / List presented to:  C-1 Patient   DME arranged  NA      DME agency  NA     [place] arranged  HH-1 RN  [place].   Status of service:  Completed, signed off Medicare Important Message given?   (If response is "NO", the following Medicare IM given date fields will be blank) Date Medicare IM given:   Medicare IM given by:   Date Additional Medicare IM given:   Additional Medicare IM given by:    Discharge Disposition:  [place]  Per UR Regulation:    If discussed at Long Length of Stay Meetings, dates discussed:    Comments:  04/23/14 15:00 Cm met with pt in room to offer home health choice.  Pt chooses [facility] to render HHPT/RN.  Address and contact information verified with pt and request to please contact him and not wife was made.  Referral called to [facility] with notation of contact preference.  No other CM needs were communicated.  Mariane Masters, BSN, CM [phone].

## 2014-04-24 NOTE — Progress Notes (Signed)
Addendum to Physical therapy note   May 19, 2014 1435  PT G-Codes **NOT FOR INPATIENT CLASS**  Functional Assessment Tool Used clinical judgement  Functional Limitation Mobility: Walking and moving around  Mobility: Walking and Moving Around Current Status (C5852) CJ  Mobility: Walking and Moving Around Goal Status (D7824) CI  Elwyn Reach, [place]

## 2014-04-27 ENCOUNTER — Encounter: Payer: Self-pay | Admitting: Emergency Medicine

## 2014-04-27 ENCOUNTER — Ambulatory Visit (HOSPITAL_COMMUNITY)
Admission: RE | Admit: 2014-04-27 | Discharge: 2014-04-27 | Disposition: A | Discharge status: Home / Self Care | Payer: Medicare Other | Source: Ambulatory Visit | Attending: Emergency Medicine | Admitting: Emergency Medicine

## 2014-04-27 ENCOUNTER — Ambulatory Visit (INDEPENDENT_AMBULATORY_CARE_PROVIDER_SITE_OTHER): Payer: Medicare Other | Admitting: Emergency Medicine

## 2014-04-27 VITALS — BP 152/88 | HR 70 | Temp 98.2°F | Resp 20 | Ht 71.5 in | Wt 261.0 lb

## 2014-04-27 DIAGNOSIS — R5381 Other malaise: Secondary | ICD-10-CM

## 2014-04-27 DIAGNOSIS — J90 Pleural effusion, not elsewhere classified: Secondary | ICD-10-CM | POA: Diagnosis not present

## 2014-04-27 DIAGNOSIS — R938 Abnormal findings on diagnostic imaging of other specified body structures: Secondary | ICD-10-CM

## 2014-04-27 DIAGNOSIS — I10 Essential (primary) hypertension: Secondary | ICD-10-CM | POA: Insufficient documentation

## 2014-04-27 DIAGNOSIS — R5383 Other fatigue: Secondary | ICD-10-CM | POA: Insufficient documentation

## 2014-04-27 DIAGNOSIS — R9389 Abnormal findings on diagnostic imaging of other specified body structures: Secondary | ICD-10-CM

## 2014-04-27 LAB — BASIC METABOLIC PANEL WITH GFR
BUN: 18 mg/dL (ref 6–23)
CO2: 27 mEq/L (ref 19–32)
CREATININE: 0.9 mg/dL (ref 0.50–1.35)
Calcium: 9.4 mg/dL (ref 8.4–10.5)
Chloride: 103 mEq/L (ref 96–112)
GFR, EST NON AFRICAN AMERICAN: 77 mL/min
GFR, Est African American: 88 mL/min
Glucose, Bld: 92 mg/dL (ref 70–99)
Potassium: 4.3 mEq/L (ref 3.5–5.3)
Sodium: 139 mEq/L (ref 135–145)

## 2014-04-27 LAB — CBC WITH DIFFERENTIAL/PLATELET
BASOS ABS: 0.1 10*3/uL (ref 0.0–0.1)
Basophils Relative: 1 % (ref 0–1)
EOS ABS: 0.3 10*3/uL (ref 0.0–0.7)
EOS PCT: 4 % (ref 0–5)
HCT: 38.4 % — ABNORMAL LOW (ref 39.0–52.0)
Hemoglobin: 13.3 g/dL (ref 13.0–17.0)
Lymphocytes Relative: 47 % — ABNORMAL HIGH (ref 12–46)
Lymphs Abs: 3.5 10*3/uL (ref 0.7–4.0)
MCH: 29.1 pg (ref 26.0–34.0)
MCHC: 34.6 g/dL (ref 30.0–36.0)
MCV: 84 fL (ref 78.0–100.0)
MPV: 8.6 fL — AB (ref 9.4–12.4)
Monocytes Absolute: 0.8 10*3/uL (ref 0.1–1.0)
Monocytes Relative: 10 % (ref 3–12)
Neutro Abs: 2.9 10*3/uL (ref 1.7–7.7)
Neutrophils Relative %: 38 % — ABNORMAL LOW (ref 43–77)
PLATELETS: 225 10*3/uL (ref 150–400)
RBC: 4.57 MIL/uL (ref 4.22–5.81)
RDW: 14.6 % (ref 11.5–15.5)
WBC: 7.5 10*3/uL (ref 4.0–10.5)

## 2014-04-27 LAB — HEPATIC FUNCTION PANEL
ALK PHOS: 76 U/L (ref 39–117)
ALT: 19 U/L (ref 0–53)
AST: 17 U/L (ref 0–37)
Albumin: 3.8 g/dL (ref 3.5–5.2)
BILIRUBIN INDIRECT: 0.5 mg/dL (ref 0.2–1.2)
Bilirubin, Direct: 0.1 mg/dL (ref 0.0–0.3)
TOTAL PROTEIN: 6.5 g/dL (ref 6.0–8.3)
Total Bilirubin: 0.6 mg/dL (ref 0.2–1.2)

## 2014-04-27 NOTE — Progress Notes (Signed)
Subjective:    Patient ID: [name], male    DOB: 24-Oct-1924, 80 y.o.   MRN: [id number]  HPI Comments: 80 yo WM recent hospitalization for syncope. He notes he has been falling more without reason. He was diagnosed with dehydration and advised decrease Lasix to 1/2 =20mg . He feels he has been taking 1.5 of Lasix + 60 mg instead of 20 mg. He declined home health after hospital stay. He is not exercising routinely. He notes SOB only with increased activity. He is walking with cain or motorized scooter.    Medication List       This list is accurate as of: 04/27/14 11:59 PM.  Always use your most recent med list.               Coenzyme Q10 400 MG Caps  Take 1 capsule by mouth daily.     cyclobenzaprine 10 MG tablet  Commonly known as:  FLEXERIL  TAKE ONE-HALF TO ONE TABLET BY MOUTH THREE TIMES DAILY AS NEEDED FOR MUSCLE SPASM     enalapril 20 MG tablet  Commonly known as:  VASOTEC  TAKE ONE TABLET BY MOUTH DAILY     fish oil-omega-3 fatty acids 1000 MG capsule  Take 1 g by mouth daily.     furosemide 40 MG tablet  Commonly known as:  LASIX  Take 0.5 tablets (20 mg total) by mouth daily.     HYDROcodone-acetaminophen 5-325 MG per tablet  Commonly known as:  NORCO  Take 1 tablet by mouth every 6 (six) hours as needed for moderate pain.     isosorbide mononitrate 60 MG 24 hr tablet  Commonly known as:  IMDUR  TAKE ONE AND ONE-HALF TABLETS BY MOUTH ONCE DAILY     levothyroxine 200 MCG tablet  Commonly known as:  SYNTHROID, LEVOTHROID  TAKE ONE TABLET BY MOUTH EVERY DAY     MAGNESIUM PO  Take 250 mg by mouth daily.     multivitamin tablet  Take 1 tablet by mouth daily.     nitroGLYCERIN 0.4 MG SL tablet  Commonly known as:  NITROSTAT  Place 1 tablet (0.4 mg total) under the tongue every 5 (five) minutes as needed.     potassium chloride SA 20 MEQ tablet  Commonly known as:  K-DUR,KLOR-CON  Take 20 mEq by mouth daily.     triamcinolone cream 0.1 %  Commonly  known as:  KENALOG  Apply 1 application topically 2 (two) times daily.     Vitamin D-3 5000 UNITS Tabs  Take by mouth daily.       Allergies  Allergen Reactions  . Prednisone    Past Medical History  Diagnosis Date  . Hypothyroidism   . 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  Marland Kitchen Hernia   . Bone spur     and fused vertebrae.  He   is treated by Dr. Theda Sers.  Marland Kitchen CAD (coronary artery disease)     Cath March 2012. 95% left main stenosis. Patent LIMA to the LAD, patent saphenous vein graft to OM, patent saphenous vein graft to the right coronary artery.  Marland Kitchen PVD (peripheral vascular disease)     (90% left iliac stenosis)  . Aortic ectasia       no abdominal aortic aneurysm.  He is followed for this by   Dr. Curt Jews.  . Constipated   . Hypertension   . COPD (chronic obstructive  pulmonary disease)   . Hyperlipidemia   . Abdominal aortic aneurysm     3.3 cm  . Pre-diabetes   . Vitamin D deficiency     Review of Systems  Respiratory: Positive for shortness of breath.   Cardiovascular: Positive for leg swelling.  All other systems reviewed and are negative.  BP 152/88 mmHg  Pulse 70  Temp(Src) 98.2 F (36.8 C) (Temporal)  Resp 20  Ht 5' 11.5" (1.816 m)  Wt 261 lb (118.389 kg)  BMI 35.90 kg/m2  SpO2 95%     Objective:   Physical Exam  Constitutional: He is oriented to person, place, and time. He appears well-developed and well-nourished.  HENT:  Head: Normocephalic and atraumatic.  Right Ear: External ear normal.  Left Ear: External ear normal.  Eyes: Conjunctivae and EOM are normal.  Neck: Normal range of motion. Neck supple. No JVD present. No thyromegaly present.  Cardiovascular: Normal rate, regular rhythm, normal heart sounds and intact distal pulses.   1 + pitting edema bilateral LE  Pulmonary/Chest: Effort normal and breath sounds normal.  Abdominal: Soft. Bowel sounds are normal. He exhibits no distension. There is  no tenderness.  Musculoskeletal: He exhibits no edema or tenderness.  WITH CAIN, unsteady gait with initiating  Lymphadenopathy:    He has no cervical adenopathy.  Neurological: He is alert and oriented to person, place, and time. He has normal reflexes. No cranial nerve deficit. Coordination normal.  Skin: Skin is warm and dry. No rash noted. No erythema.  Psychiatric: He has a normal mood and affect. His behavior is normal. Judgment and thought content normal.  Nursing note and vitals reviewed.         Assessment & Plan:  1. Hospitalization f/u for dehydration/ syncope-since declines home health/ PT Advised chair exercises TID, d/c Cain and use walker for protection. Recheck labs/ CXR. Make sure only taking 20 mg of Lasix. w/c if SX increase or ER.   2. Unsteady gait- Advised use walker, increase exercise to help regain strength. With NEG workup at Hospital other than dehydration if symptoms do not improve may need Neuro referal

## 2014-04-27 NOTE — Patient Instructions (Signed)

## 2014-05-01 ENCOUNTER — Telehealth: Payer: Self-pay | Admitting: *Deleted

## 2014-05-01 NOTE — Telephone Encounter (Signed)
Spoke with patient about recent lab results and instructions.  Advised patient to keep 05/05/14 ov with [facility].

## 2014-05-05 ENCOUNTER — Ambulatory Visit: Payer: Medicare Other | Admitting: Internal Medicine

## 2014-05-07 NOTE — Progress Notes (Signed)
Patient ID: [name], male   DOB: September 13, 1924, 80 y.o.   MRN: [id number]   R  E  S  C  H  E  D  U  L  E  D

## 2014-05-08 ENCOUNTER — Encounter: Payer: Self-pay | Admitting: Internal Medicine

## 2014-05-08 ENCOUNTER — Ambulatory Visit (INDEPENDENT_AMBULATORY_CARE_PROVIDER_SITE_OTHER): Payer: Medicare Other | Admitting: Internal Medicine

## 2014-05-08 VITALS — BP 114/66 | HR 88 | Temp 98.2°F | Resp 28 | Ht 71.5 in | Wt 261.0 lb

## 2014-05-08 DIAGNOSIS — E782 Mixed hyperlipidemia: Secondary | ICD-10-CM | POA: Diagnosis not present

## 2014-05-08 DIAGNOSIS — E559 Vitamin D deficiency, unspecified: Secondary | ICD-10-CM

## 2014-05-08 DIAGNOSIS — I10 Essential (primary) hypertension: Secondary | ICD-10-CM

## 2014-05-08 DIAGNOSIS — E039 Hypothyroidism, unspecified: Secondary | ICD-10-CM | POA: Diagnosis not present

## 2014-05-08 DIAGNOSIS — J449 Chronic obstructive pulmonary disease, unspecified: Secondary | ICD-10-CM | POA: Diagnosis not present

## 2014-05-08 DIAGNOSIS — Z79899 Other long term (current) drug therapy: Secondary | ICD-10-CM

## 2014-05-08 NOTE — Progress Notes (Signed)
Subjective:    Patient ID: [name], male    DOB: [birth year], 80 y.o.   MRN: [id number]  HPI Patient is a very nice 80 yo MWM with HTN, ASCAD, & O2 Dependent COPD who presents for f/u. Patient denies any CP, palpitations, but does report exertional dyspnea as well as orthopnea & dyspnea and dependent edema. No recent cough, congestion or sputum production and no GI, GU or MS complaints. Outpatient Prescriptions Prior to Visit  Medication Sig Dispense Refill  . Cholecalciferol (VITAMIN D-3) 5000 UNITS TABS Take by mouth daily.     . Coenzyme Q10 400 MG CAPS Take 1 capsule by mouth daily.    . cyclobenzaprine (FLEXERIL) 10 MG tablet TAKE ONE-HALF TO ONE TABLET BY MOUTH THREE TIMES DAILY AS NEEDED FOR MUSCLE SPASM 90 tablet 0  . enalapril (VASOTEC) 20 MG tablet TAKE ONE TABLET BY MOUTH DAILY 90 tablet 0  . fish oil-omega-3 fatty acids 1000 MG capsule Take 1 g by mouth daily.      . furosemide (LASIX) 40 MG tablet Take 0.5 tablets (20 mg total) by mouth daily. 30 tablet 0  . HYDROcodone-acetaminophen (NORCO) 5-325 MG per tablet Take 1 tablet by mouth every 6 (six) hours as needed for moderate pain. 30 tablet 0  . isosorbide mononitrate (IMDUR) 60 MG 24 hr tablet TAKE ONE AND ONE-HALF TABLETS BY MOUTH ONCE DAILY 45 tablet 10  . levothyroxine (SYNTHROID, LEVOTHROID) 200 MCG tablet TAKE ONE TABLET BY MOUTH EVERY DAY 90 tablet 0  . MAGNESIUM PO Take 250 mg by mouth daily.     . Multiple Vitamin (MULTIVITAMIN) tablet Take 1 tablet by mouth daily.    . nitroGLYCERIN (NITROSTAT) 0.4 MG SL tablet Place 1 tablet (0.4 mg total) under the tongue every 5 (five) minutes as needed. 25 tablet 11  . potassium chloride SA (K-DUR,KLOR-CON) 20 MEQ tablet Take 20 mEq by mouth daily.    Marland Kitchen triamcinolone cream (KENALOG) 0.1 % Apply 1 application topically 2 (two) times daily. 85.2 g 2   No facility-administered medications prior to visit.   Allergies  Allergen Reactions  . Prednisone    Past Medical History   Diagnosis Date  . Hypothyroidism   . 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  Marland Kitchen Hernia   . Bone spur     and fused vertebrae.  He   is treated by Dr. Theda Sers.  Marland Kitchen CAD (coronary artery disease)     Cath March 2012. 95% left main stenosis. Patent LIMA to the LAD, patent saphenous vein graft to OM, patent saphenous vein graft to the right coronary artery.  Marland Kitchen PVD (peripheral vascular disease)     (90% left iliac stenosis)  . Aortic ectasia       no abdominal aortic aneurysm.  He is followed for this by   Dr. Curt Jews.  . Constipated   . Hypertension   . COPD (chronic obstructive pulmonary disease)   . Hyperlipidemia   . Abdominal aortic aneurysm     3.3 cm  . Pre-diabetes   . Vitamin D deficiency    Review of Systems  In addition to the HPI above,  No Fever-chills,  No Headache, No changes with Vision or hearing,  No problems swallowing food or Liquids,  No Chest pain or productive Cough,  No Abdominal pain, No Nausea or Vomitting, Bowel movements are regular,  No Blood in stool or Urine,  No dysuria,  No new skin rashes or bruises,  No new joints pains-aches,  No new weakness, tingling, numbness in any extremity,  No recent weight loss,  No polyuria, polydypsia or polyphagia,  No significant Mental Stressors.  A full 10 point Review of Systems was done, except as stated above, all other Review of Systems were negative    Objective:   Physical Exam      BP 114/66   Pulse 88  Temp 98.2 F  Resp 28  Ht 5' 11.5"   Wt 261 lb      BMI 35.90   Labored respirations with difficulty speaking with having to stop and catch his breath while speaking.   HEENT - Eac's patent. TM's Nl. EOM's full. PERRLA. NasoOroPharynx clear. Neck - supple. Nl Thyroid. Carotids 2+ & No bruits, nodes, JVD Chest - Very distant BS w /few scattered rales Cor - Distant  HS. RRR w/o sig MGR. PP 1(+). 1-2 (+) ankle / pretibial edema. Abd - No palpable  organomegaly, masses or tenderness. BS nl. MS- FROM w/o deformities. Generalized decrease in muscle power, tone and bulk. Gait very slow & broad- based. Neuro - No obvious Cr N abnormalities. Sensory, motor and Cerebellar functions appear Nl w/o focal abnormalities. Psyche - Mental status normal & appropriate.  No delusions, ideations or obvious mood abnormalities.  O2 Sat on Rm Air = 88% at baseline at rest and quickly drops to 82-84% after walking 20 feet. Then administering 2 liters nasal O2, his O2 Sat recovers to 92% with persisting rapid shallow respirations persisting several minutes      Assessment & Plan:   1. Essential hypertension   2. Chronic obstructive pulmonary disease,   - advised to continue his supplemental O2   3. Hyperlipidemia  - Lipid panel  4. Hypothyroidism, unspecified hypothyroidism type   5. Medication management   6. Vitamin D deficiency

## 2014-05-09 LAB — LIPID PANEL
CHOLESTEROL: 197 mg/dL (ref 0–200)
HDL: 44 mg/dL (ref >39)
LDL Cholesterol: 120 mg/dL — ABNORMAL HIGH (ref 0–99)
Total CHOL/HDL Ratio: 4.5 Ratio
Triglycerides: 163 mg/dL — ABNORMAL HIGH (ref <150)
VLDL: 33 mg/dL (ref 0–40)

## 2014-06-28 ENCOUNTER — Ambulatory Visit (INDEPENDENT_AMBULATORY_CARE_PROVIDER_SITE_OTHER): Payer: Medicare Other | Admitting: Cardiology

## 2014-06-28 ENCOUNTER — Encounter: Payer: Self-pay | Admitting: Cardiology

## 2014-06-28 VITALS — BP 132/70 | HR 79 | Ht 71.0 in | Wt 264.2 lb

## 2014-06-28 DIAGNOSIS — R0600 Dyspnea, unspecified: Secondary | ICD-10-CM | POA: Diagnosis not present

## 2014-06-28 NOTE — Progress Notes (Signed)
HPI The patient presents for followup of CAD, dyspnea and a syncopal episode.  He was in the late November with syncope. I reviewed these records. This was likely related to some mild dehydration. He had an extensive workup including chest CT negative for pulmonary embolism, head CT negative for acute events, echocardiogram which was unremarkable. He's not had any further episodes since then. The patient is mostly limited by joint pain. This cannot be managed surgically. He has chronic dyspnea with exertion. He denies any PND or orthopnea.  However, again his is very limited.     Allergies  Allergen Reactions  . Prednisone     Current Outpatient Prescriptions  Medication Sig Dispense Refill  . Cholecalciferol (VITAMIN D-3) 5000 UNITS TABS Take by mouth daily.     . Coenzyme Q10 400 MG CAPS Take 1 capsule by mouth daily.    . cyclobenzaprine (FLEXERIL) 10 MG tablet TAKE ONE-HALF TO ONE TABLET BY MOUTH THREE TIMES DAILY AS NEEDED FOR MUSCLE SPASM 90 tablet 0  . enalapril (VASOTEC) 20 MG tablet TAKE ONE TABLET BY MOUTH DAILY 90 tablet 0  . fish oil-omega-3 fatty acids 1000 MG capsule Take 1 g by mouth daily.      . furosemide (LASIX) 40 MG tablet Take 0.5 tablets (20 mg total) by mouth daily. 30 tablet 0  . isosorbide mononitrate (IMDUR) 60 MG 24 hr tablet TAKE ONE AND ONE-HALF TABLETS BY MOUTH ONCE DAILY 45 tablet 10  . levothyroxine (SYNTHROID, LEVOTHROID) 200 MCG tablet TAKE ONE TABLET BY MOUTH EVERY DAY 90 tablet 0  . MAGNESIUM PO Take 250 mg by mouth daily.     . Multiple Vitamin (MULTIVITAMIN) tablet Take 1 tablet by mouth daily.    . nitroGLYCERIN (NITROSTAT) 0.4 MG SL tablet Place 1 tablet (0.4 mg total) under the tongue every 5 (five) minutes as needed. 25 tablet 11  . potassium chloride SA (K-DUR,KLOR-CON) 20 MEQ tablet Take 20 mEq by mouth daily.     No current facility-administered medications for this visit.    Past Medical History  Diagnosis Date  . Hypothyroidism   . Skin  cancer     recent squamous cell carcinoma removed from right   index finger.  Marland Kitchen Hernia   . Bone spur     and fused vertebrae.  He   is treated by Dr. Theda Sers.  Marland Kitchen CAD (coronary artery disease)     Cath March 2012. 95% left main stenosis. Patent LIMA to the LAD, patent saphenous vein graft to OM, patent saphenous vein graft to the right coronary artery.  Marland Kitchen PVD (peripheral vascular disease)     (90% left iliac stenosis)  . Aortic ectasia       no abdominal aortic aneurysm.  He is followed for this by   Dr. Curt Jews.  . Constipated   . Hypertension   . COPD (chronic obstructive pulmonary disease)   . Hyperlipidemia   . Abdominal aortic aneurysm     3.3 cm  . Pre-diabetes   . Vitamin D deficiency     Past Surgical History  Procedure Laterality Date  . Pneumonectomy    . Knee surgery    . Cataract extraction    . Pilonidal cyst / sinus excision    . Coronary artery bypass graft       2010 Median sternotomy, extracorporeal circulation,   coronary artery bypass graft surgery x3 using a left internal mammary   artery graft to left anterior descending coronary artery, with  a    saphenous vein graft to the intermediate coronary artery, and a   saphenous vein graft to the right coronary artery.  Endoscopic vein   harvesting from the left leg.     ROS:  As stated in the HPI and negative for all other systems.  PHYSICAL EXAM BP 132/70 mmHg  Pulse 79  Ht 5\' 11"  (1.803 m)  Wt 264 lb 3.2 oz (119.84 kg)  BMI 36.86 kg/m2 GENERAL:  No acute distress NECK:  No jugular venous distention, waveform within normal limits, carotid upstroke brisk and symmetric, no bruits, no thyromegaly LUNGS: Clear BACK:  No CVA tenderness CHEST:  Well healed sternotomy scar. HEART:  PMI not displaced or sustained,S1 and S2 within normal limits, no S3, no S4, no clicks, no rubs, no murmurs ABD:  Flat, positive bowel sounds normal in frequency in pitch, no bruits, no rebound, no guarding, no midline pulsatile  mass, no hepatomegaly, no splenomegaly EXT:  2 plus pulses throughout, mild ankle edema, no cyanosis no clubbing   ASSESSMENT AND PLAN  CAD -  He has no acute symptoms. No further cardiovascular testing is indicated  DYSPNEA ON EXERTION -  This is chronic. I suspect a fair amount of this is secondary to his weight and deconditioning.  At this point no change in therapy is indicated.   SYNCOPE - He has had no further events. No further workup is planned.

## 2014-06-28 NOTE — Patient Instructions (Signed)
Your physician recommends that you schedule a follow-up appointment in: 6 months with Dr. Hochrein  

## 2014-06-29 ENCOUNTER — Other Ambulatory Visit: Payer: Self-pay | Admitting: Internal Medicine

## 2014-06-29 ENCOUNTER — Other Ambulatory Visit: Payer: Self-pay | Admitting: Physician Assistant

## 2014-07-05 DIAGNOSIS — Z79891 Long term (current) use of opiate analgesic: Secondary | ICD-10-CM | POA: Diagnosis not present

## 2014-07-05 DIAGNOSIS — G894 Chronic pain syndrome: Secondary | ICD-10-CM | POA: Diagnosis not present

## 2014-07-05 DIAGNOSIS — M5136 Other intervertebral disc degeneration, lumbar region: Secondary | ICD-10-CM | POA: Diagnosis not present

## 2014-07-13 DIAGNOSIS — Z96652 Presence of left artificial knee joint: Secondary | ICD-10-CM | POA: Diagnosis not present

## 2014-07-13 DIAGNOSIS — M1711 Unilateral primary osteoarthritis, right knee: Secondary | ICD-10-CM | POA: Diagnosis not present

## 2014-07-19 ENCOUNTER — Encounter: Payer: Self-pay | Admitting: Podiatry

## 2014-07-19 ENCOUNTER — Ambulatory Visit (INDEPENDENT_AMBULATORY_CARE_PROVIDER_SITE_OTHER): Payer: Medicare Other | Admitting: Podiatry

## 2014-07-19 VITALS — BP 72/49 | HR 73 | Resp 18

## 2014-07-19 DIAGNOSIS — M79676 Pain in unspecified toe(s): Secondary | ICD-10-CM

## 2014-07-19 DIAGNOSIS — B351 Tinea unguium: Secondary | ICD-10-CM

## 2014-07-19 DIAGNOSIS — I739 Peripheral vascular disease, unspecified: Secondary | ICD-10-CM | POA: Diagnosis not present

## 2014-07-19 DIAGNOSIS — M1711 Unilateral primary osteoarthritis, right knee: Secondary | ICD-10-CM | POA: Diagnosis not present

## 2014-07-19 NOTE — Progress Notes (Signed)
   Subjective:    Patient ID: [name], male    DOB: 05-Apr-1925, 81 y.o.   MRN: [id number]  HPI 81 y.o.-year-old male returns the office today for painful, elongated, thickened toenails. Denies any redness or drainage around the nails. Denies any acute changes since last appointment and no new complaints today. Denies any systemic complaints such as fevers, chills, nausea, vomiting.      Review of Systems  All other systems reviewed and are negative.      Objective:   Physical Exam AAO 3, NAD DP/PT pulses palpable although somewhat decreased, CRT less than 3 seconds Chronic bilateral lower extremity edema Protective sensation decreased with Simms Weinstein monofilament, decreased vibratory sensation. Achilles tendon reflex intact.  Nails hypertrophic, dystrophic, elongated, brittle, discolored 10. There is tenderness overlying these nails 1-5 bilaterally. There is no surrounding erythema or drainage along the nail sites. No open lesions or pre-ulcerative lesions are identified. The areas of prior sunburn have healed.  No other areas of tenderness bilateral lower extremities. No overlying edema, erythema, increased warmth. No pain with calf compression, swelling, warmth, erythema.       Assessment & Plan:  81 year old male with syndesmotic onychomycosis. -Treatment options including alternatives, risks, complications were discussed -Nails sharply debrided  10 without complication/bleeding. -Discussed daily foot inspection. If there are any changes, to call the office immediately.  -Follow-up in 3 months or sooner if any problems are to arise. In the meantime, encouraged to call the office with any questions, concerns, changes symptoms.

## 2014-07-20 ENCOUNTER — Encounter: Payer: Self-pay | Admitting: Podiatry

## 2014-07-26 DIAGNOSIS — M1711 Unilateral primary osteoarthritis, right knee: Secondary | ICD-10-CM | POA: Diagnosis not present

## 2014-07-28 ENCOUNTER — Ambulatory Visit (INDEPENDENT_AMBULATORY_CARE_PROVIDER_SITE_OTHER): Payer: Medicare Other | Admitting: Physician Assistant

## 2014-07-28 ENCOUNTER — Encounter: Payer: Self-pay | Admitting: Physician Assistant

## 2014-07-28 VITALS — BP 128/72 | HR 72 | Temp 97.8°F | Resp 16 | Ht 71.5 in | Wt 262.0 lb

## 2014-07-28 DIAGNOSIS — R002 Palpitations: Secondary | ICD-10-CM

## 2014-07-28 DIAGNOSIS — E039 Hypothyroidism, unspecified: Secondary | ICD-10-CM

## 2014-07-28 DIAGNOSIS — I25118 Atherosclerotic heart disease of native coronary artery with other forms of angina pectoris: Secondary | ICD-10-CM

## 2014-07-28 DIAGNOSIS — Z79899 Other long term (current) drug therapy: Secondary | ICD-10-CM

## 2014-07-28 DIAGNOSIS — N179 Acute kidney failure, unspecified: Secondary | ICD-10-CM

## 2014-07-28 DIAGNOSIS — Z0001 Encounter for general adult medical examination with abnormal findings: Secondary | ICD-10-CM | POA: Diagnosis not present

## 2014-07-28 DIAGNOSIS — E782 Mixed hyperlipidemia: Secondary | ICD-10-CM

## 2014-07-28 DIAGNOSIS — R6889 Other general symptoms and signs: Secondary | ICD-10-CM

## 2014-07-28 DIAGNOSIS — J449 Chronic obstructive pulmonary disease, unspecified: Secondary | ICD-10-CM

## 2014-07-28 DIAGNOSIS — E559 Vitamin D deficiency, unspecified: Secondary | ICD-10-CM

## 2014-07-28 DIAGNOSIS — Z1331 Encounter for screening for depression: Secondary | ICD-10-CM

## 2014-07-28 DIAGNOSIS — R7309 Other abnormal glucose: Secondary | ICD-10-CM | POA: Diagnosis not present

## 2014-07-28 DIAGNOSIS — I10 Essential (primary) hypertension: Secondary | ICD-10-CM

## 2014-07-28 DIAGNOSIS — I739 Peripheral vascular disease, unspecified: Secondary | ICD-10-CM

## 2014-07-28 DIAGNOSIS — Z23 Encounter for immunization: Secondary | ICD-10-CM

## 2014-07-28 DIAGNOSIS — R7303 Prediabetes: Secondary | ICD-10-CM

## 2014-07-28 DIAGNOSIS — Z9181 History of falling: Secondary | ICD-10-CM

## 2014-07-28 LAB — CBC WITH DIFFERENTIAL/PLATELET
BASOS ABS: 0.1 10*3/uL (ref 0.0–0.1)
BASOS PCT: 1 % (ref 0–1)
EOS ABS: 0.2 10*3/uL (ref 0.0–0.7)
Eosinophils Relative: 3 % (ref 0–5)
HCT: 40.2 % (ref 39.0–52.0)
Hemoglobin: 13.6 g/dL (ref 13.0–17.0)
Lymphocytes Relative: 39 % (ref 12–46)
Lymphs Abs: 2.6 10*3/uL (ref 0.7–4.0)
MCH: 29.2 pg (ref 26.0–34.0)
MCHC: 33.8 g/dL (ref 30.0–36.0)
MCV: 86.3 fL (ref 78.0–100.0)
MONOS PCT: 12 % (ref 3–12)
MPV: 8.8 fL (ref 8.6–12.4)
Monocytes Absolute: 0.8 10*3/uL (ref 0.1–1.0)
NEUTROS PCT: 45 % (ref 43–77)
Neutro Abs: 3 10*3/uL (ref 1.7–7.7)
PLATELETS: 208 10*3/uL (ref 150–400)
RBC: 4.66 MIL/uL (ref 4.22–5.81)
RDW: 15 % (ref 11.5–15.5)
WBC: 6.7 10*3/uL (ref 4.0–10.5)

## 2014-07-28 LAB — HEPATIC FUNCTION PANEL
ALBUMIN: 3.8 g/dL (ref 3.5–5.2)
ALK PHOS: 77 U/L (ref 39–117)
ALT: 13 U/L (ref 0–53)
AST: 12 U/L (ref 0–37)
BILIRUBIN INDIRECT: 0.5 mg/dL (ref 0.2–1.2)
Bilirubin, Direct: 0.1 mg/dL (ref 0.0–0.3)
Total Bilirubin: 0.6 mg/dL (ref 0.2–1.2)
Total Protein: 6.3 g/dL (ref 6.0–8.3)

## 2014-07-28 LAB — BASIC METABOLIC PANEL WITH GFR
BUN: 12 mg/dL (ref 6–23)
CHLORIDE: 108 meq/L (ref 96–112)
CO2: 23 meq/L (ref 19–32)
CREATININE: 0.87 mg/dL (ref 0.50–1.35)
Calcium: 9.1 mg/dL (ref 8.4–10.5)
GFR, EST NON AFRICAN AMERICAN: 78 mL/min
GFR, Est African American: >89 mL/min
Glucose, Bld: 94 mg/dL (ref 70–99)
POTASSIUM: 4.2 meq/L (ref 3.5–5.3)
Sodium: 141 mEq/L (ref 135–145)

## 2014-07-28 LAB — LIPID PANEL
Cholesterol: 189 mg/dL (ref 0–200)
HDL: 44 mg/dL (ref >=40)
LDL CALC: 112 mg/dL — AB (ref 0–99)
Total CHOL/HDL Ratio: 4.3 Ratio
Triglycerides: 167 mg/dL — ABNORMAL HIGH (ref <150)
VLDL: 33 mg/dL (ref 0–40)

## 2014-07-28 LAB — MAGNESIUM: Magnesium: 2 mg/dL (ref 1.5–2.5)

## 2014-07-28 NOTE — Patient Instructions (Signed)
Get on fluid pill for 2-3 days every day for you breathing  Do the following things EVERYDAY: 1) Weigh yourself in the morning before breakfast. Write it down and keep it in a log. 2) Take your medicines as prescribed 3) Eat low salt foods-Limit salt (sodium) to 2000 mg per day. Best thing to do is avoid processed foods.   4) Stay as active as you can everyday 5) Limit all fluids for the day to less than 2 liters  Call your doctor if:  Anytime you have any of the following symptoms:  1) 3 pound weight gain in 24 hours or 5 pounds in 1 week  2) shortness of breath, with or without a dry hacking cough  3) swelling in the hands, feet or stomach  4) if you have to sleep on extra pillows at night in order to breathe. 5) after laying down at night for 20-30 mins, you wake up short of breath.   These can all be signs of fluid overload.   Preventative Care for Adults, Male       REGULAR HEALTH EXAMS:  A routine yearly physical is a good way to check in with your primary care provider about your health and preventive screening. It is also an opportunity to share updates about your health and any concerns you have, and receive a thorough all-over exam.   Most health insurance companies pay for at least some preventative services.  Check with your health plan for specific coverages.  WHAT PREVENTATIVE SERVICES DO MEN NEED?  Adult men should have their weight and blood pressure checked regularly.   Men age 64 and older should have their cholesterol levels checked regularly.  Beginning at age 35 and continuing to age 1, men should be screened for colorectal cancer.  Certain people should may need continued testing until age 48.  Other cancer screening may include exams for testicular and prostate cancer.  Updating vaccinations is part of preventative care.  Vaccinations help protect against diseases such as the flu.  Lab tests are generally done as part of preventative care to screen for  anemia and blood disorders, to screen for problems with the kidneys and liver, to screen for bladder problems, to check blood sugar, and to check your cholesterol level.  Preventative services generally include counseling about diet, exercise, avoiding tobacco, drugs, excessive alcohol consumption, and sexually transmitted infections.    GENERAL RECOMMENDATIONS FOR GOOD HEALTH:  Healthy diet:  Eat a variety of foods, including fruit, vegetables, animal or vegetable protein, such as meat, fish, chicken, and eggs, or beans, lentils, tofu, and grains, such as rice.  Drink plenty of water daily.  Decrease saturated fat in the diet, avoid lots of red meat, processed foods, sweets, fast foods, and fried foods.  Exercise:  Aerobic exercise helps maintain good heart health. At least 30-40 minutes of moderate-intensity exercise is recommended. For example, a brisk walk that increases your heart rate and breathing. This should be done on most days of the week.   Find a type of exercise or a variety of exercises that you enjoy so that it becomes a part of your daily life.  Examples are running, walking, swimming, water aerobics, and biking.  For motivation and support, explore group exercise such as aerobic class, spin class, Zumba, Yoga,or  martial arts, etc.    Set exercise goals for yourself, such as a certain weight goal, walk or run in a race such as a 5k walk/run.  Speak to  your primary care provider about exercise goals.  Disease prevention:  If you smoke or chew tobacco, find out from your caregiver how to quit. It can literally save your life, no matter how long you have been a tobacco user. If you do not use tobacco, never begin.   Maintain a healthy diet and normal weight. Increased weight leads to problems with blood pressure and diabetes.   The Body Mass Index or BMI is a way of measuring how much of your body is fat. Having a BMI above 27 increases the risk of heart disease, diabetes,  hypertension, stroke and other problems related to obesity. Your caregiver can help determine your BMI and based on it develop an exercise and dietary program to help you achieve or maintain this important measurement at a healthful level.  High blood pressure causes heart and blood vessel problems.  Persistent high blood pressure should be treated with medicine if weight loss and exercise do not work.   Fat and cholesterol leaves deposits in your arteries that can block them. This causes heart disease and vessel disease elsewhere in your body.  If your cholesterol is found to be high, or if you have heart disease or certain other medical conditions, then you may need to have your cholesterol monitored frequently and be treated with medication.   Ask if you should have a stress test if your history suggests this. A stress test is a test done on a treadmill that looks for heart disease. This test can find disease prior to there being a problem.  Avoid drinking alcohol in excess (more than two drinks per day).  Avoid use of street drugs. Do not share needles with anyone. Ask for professional help if you need assistance or instructions on stopping the use of alcohol, cigarettes, and/or drugs.  Brush your teeth twice a day with fluoride toothpaste, and floss once a day. Good oral hygiene prevents tooth decay and gum disease. The problems can be painful, unattractive, and can cause other health problems. Visit your dentist for a routine oral and dental check up and preventive care every 6-12 months.   Look at your skin regularly.  Use a mirror to look at your back. Notify your caregivers of changes in moles, especially if there are changes in shapes, colors, a size larger than a pencil eraser, an irregular border, or development of new moles.  Safety:  Use seatbelts 100% of the time, whether driving or as a passenger.  Use safety devices such as hearing protection if you work in environments with loud  noise or significant background noise.  Use safety glasses when doing any work that could send debris in to the eyes.  Use a helmet if you ride a bike or motorcycle.  Use appropriate safety gear for contact sports.  Talk to your caregiver about gun safety.  Use sunscreen with a SPF (or skin protection factor) of 15 or greater.  Lighter skinned people are at a greater risk of skin cancer. Don't forget to also wear sunglasses in order to protect your eyes from too much damaging sunlight. Damaging sunlight can accelerate cataract formation.   Practice safe sex. Use condoms. Condoms are used for birth control and to help reduce the spread of sexually transmitted infections (or STIs).  Some of the STIs are gonorrhea (the clap), chlamydia, syphilis, trichomonas, herpes, HPV (human papilloma virus) and HIV (human immunodeficiency virus) which causes AIDS. The herpes, HIV and HPV are viral illnesses that have no cure.  These can result in disability, cancer and death.   Keep carbon monoxide and smoke detectors in your home functioning at all times. Change the batteries every 6 months or use a model that plugs into the wall.   Vaccinations:  Stay up to date with your tetanus shots and other required immunizations. You should have a booster for tetanus every 10 years. Be sure to get your flu shot every year, since 5%-20% of the [place] population comes down with the flu. The flu vaccine changes each year, so being vaccinated once is not enough. Get your shot in the fall, before the flu season peaks.   Other vaccines to consider:  Pneumococcal vaccine to protect against certain types of pneumonia.  This is normally recommended for adults age 53 or older.  However, adults younger than 79 years old with certain underlying conditions such as diabetes, heart or lung disease should also receive the vaccine.  Shingles vaccine to protect against Varicella Zoster if you are older than age 94, or younger than 80 years old  with certain underlying illness.  Hepatitis A vaccine to protect against a form of infection of the liver by a virus acquired from food.  Hepatitis B vaccine to protect against a form of infection of the liver by a virus acquired from blood or body fluids, particularly if you work in health care.  If you plan to travel internationally, check with your local health department for specific vaccination recommendations.  Cancer Screening:  Most routine colon cancer screening begins at the age of 20. On a yearly basis, doctors may provide special easy to use take-home tests to check for hidden blood in the stool. Sigmoidoscopy or colonoscopy can detect the earliest forms of colon cancer and is life saving. These tests use a small camera at the end of a tube to directly examine the colon. Speak to your caregiver about this at age 89, when routine screening begins (and is repeated every 5 years unless early forms of pre-cancerous polyps or small growths are found).   At the age of 24 men usually start screening for prostate cancer every year. Screening may begin at a younger age for those with higher risk. Those at higher risk include African-Americans or having a family history of prostate cancer. There are two types of tests for prostate cancer:   Prostate-specific antigen (PSA) testing. Recent studies raise questions about prostate cancer using PSA and you should discuss this with your caregiver.   Digital rectal exam (in which your doctor's lubricated and gloved finger feels for enlargement of the prostate through the anus).   Screening for testicular cancer.  Do a monthly exam of your testicles. Gently roll each testicle between your thumb and fingers, feeling for any abnormal lumps. The best time to do this is after a hot shower or bath when the tissues are looser. Notify your caregivers of any lumps, tenderness or changes in size or shape immediately.

## 2014-07-28 NOTE — Progress Notes (Addendum)
MEDICARE ANNUAL WELLNESS VISIT AND FOLLOW UP Assessment:   1. Essential hypertension - continue medications, DASH diet, exercise and monitor at home. Call if greater than 130/80.  - CBC with Differential/Platelet - BASIC METABOLIC PANEL WITH GFR - Hepatic function panel  2. Hypothyroidism, unspecified hypothyroidism type Hypothyroidism-check TSH level, continue medications the same, reminded to take on an empty stomach 30-60mins before food.  - TSH  3. Hyperlipidemia -continue medications, check lipids, decrease fatty foods, increase activity.  - Lipid panel  4. Prediabetes Discussed general issues about diabetes pathophysiology and management., Educational material distributed., Suggested low cholesterol diet., Encouraged aerobic exercise., Discussed foot care., Reminded to get yearly retinal exam. - Hemoglobin A1c - HM DIABETES FOOT EXAM  5. Medication management - Magnesium  6. AKI (acute kidney injury) monitor  7. Chronic obstructive pulmonary disease, unspecified COPD, unspecified chronic bronchitis type  continue meds and O2.   8. PVD (peripheral vascular disease) with claudication Control blood pressure, cholesterol, glucose, increase exercise.  Increase walking, feet checks, get on ASA  9. Atherosclerosis of native coronary artery of native heart with other form of angina pectoris Control blood pressure, cholesterol, glucose, increase exercise.  Continue cardio follow up  10. Vitamin D deficiency Continue supplement  11. Screening for depression neg  12. At high risk for falls Declines PT, continue f/u ortho  13. Palpitations NSR today, with history instructed patient to go to cardio for holter rule out afib/VT, Go to the ER if any CP, SOB, nausea, dizziness, severe HA, changes vision/speech  14. Need for prophylactic vaccination against Streptococcus pneumoniae (pneumococcus) - Pneumococcal conjugate vaccine 13-valent IM   Plan:   During the course of  the visit the patient was educated and counseled about appropriate screening and preventive services including:    Pneumococcal vaccine   Influenza vaccine  Td vaccine  Screening electrocardiogram  Colorectal cancer screening  Diabetes screening  Glaucoma screening  Nutrition counseling   Screening recommendations, referrals: Vaccinations: Please see documentation below and orders this visit.  Nutrition assessed and recommended  Colonoscopy declined Recommended yearly ophthalmology/optometry visit for glaucoma screening and checkup Recommended yearly dental visit for hygiene and checkup Advanced directives - requested  Conditions/risks identified: BMI: Discussed weight loss, diet, and increase physical activity.  Increase physical activity: AHA recommends 150 minutes of physical activity a week.  Medications reviewed Diabetes is at goal, ACE/ARB therapy: Yes. Urinary Incontinence is an issue: discussed non pharmacology and pharmacology options.  Fall risk: high- discussed PT, home fall assessment, medications.    Subjective:  [name] is a 81 y.o. male who presents for Medicare Annual Wellness Visit and 3 month follow up for HTN, hyperlipidemia, prediabetes, and vitamin D Def.  Date of last medicare wellness visit was is unknown.  His blood pressure has been controlled at home, today their BP is BP: 128/72 mmHg He does not workout. He denies chest pain, shortness of breath, dizziness.  He states 1 week ago had palpitations with exertion, had some SOB and chest pain with it, lasted for 5 mins, states felt regular.  He has ASHD, follows with Dr. Percival Spanish, he has angina but is on imdur, has not used NTG since last year. He was in the hospital for syncope last night due to dehydration.  He is not on cholesterol medication and denies myalgias. His cholesterol is not at goal. The cholesterol last visit was:   Lab Results  Component Value Date   CHOL 197 05/08/2014    HDL 44 05/08/2014  LDLCALC 120* 05/08/2014   TRIG 163* 05/08/2014   CHOLHDL 4.5 05/08/2014   He has been working on diet and exercise for prediabetes, and denies polydipsia, polyuria and visual disturbances. Last A1C in the office was:  Lab Results  Component Value Date   HGBA1C 5.8* 02/01/2014   Patient is on Vitamin D supplement.   Lab Results  Component Value Date   VD25OH 51 10/27/2013     He is on thyroid medication. His medication was not changed last visit.   Lab Results  Component Value Date   TSH 2.180 04/22/2014  .  Decreased hearing BMI is Body mass index is 36.04 kg/(m^2)., he is working on diet and exercise. Wt Readings from Last 3 Encounters:  07/28/14 262 lb (118.842 kg)  06/28/14 264 lb 3.2 oz (119.84 kg)  05/08/14 261 lb (118.389 kg)   He has COPD and and he is O2 dependent, uses it at night and when he is walking.  He walks with a cane, has been seeing Dr. Theda Sers for injection in his right knee  Medication Review: Current Outpatient Prescriptions on File Prior to Visit  Medication Sig Dispense Refill  . Cholecalciferol (VITAMIN D-3) 5000 UNITS TABS Take by mouth daily.     . Coenzyme Q10 400 MG CAPS Take 1 capsule by mouth daily.    . cyclobenzaprine (FLEXERIL) 10 MG tablet TAKE ONE-HALF TO ONE TABLET BY MOUTH THREE TIMES DAILY AS NEEDED FOR MUSCLE SPASM. 90 tablet 0  . enalapril (VASOTEC) 20 MG tablet TAKE ONE TABLET BY MOUTH DAILY 90 tablet 0  . fish oil-omega-3 fatty acids 1000 MG capsule Take 1 g by mouth daily.      . furosemide (LASIX) 40 MG tablet Take 0.5 tablets (20 mg total) by mouth daily. 30 tablet 0  . isosorbide mononitrate (IMDUR) 60 MG 24 hr tablet TAKE ONE AND ONE-HALF TABLETS BY MOUTH ONCE DAILY 45 tablet 10  . levothyroxine (SYNTHROID, LEVOTHROID) 200 MCG tablet TAKE ONE TABLET BY MOUTH ONCE DAILY 90 tablet 0  . MAGNESIUM PO Take 250 mg by mouth daily.     . Multiple Vitamin (MULTIVITAMIN) tablet Take 1 tablet by mouth daily.    .  nitroGLYCERIN (NITROSTAT) 0.4 MG SL tablet Place 1 tablet (0.4 mg total) under the tongue every 5 (five) minutes as needed. 25 tablet 11  . potassium chloride SA (K-DUR,KLOR-CON) 20 MEQ tablet Take 20 mEq by mouth daily.     No current facility-administered medications on file prior to visit.    Current Problems (verified) Patient Active Problem List   Diagnosis Date Noted  . Syncope 04/21/2014  . COPD (chronic obstructive pulmonary disease) 04/21/2014  . AKI (acute kidney injury) 04/21/2014  . Essential hypertension 10/27/2013  . PreDiabetes 10/27/2013  . Medication management 10/27/2013  . Vitamin D deficiency 10/27/2013  . Hyperlipidemia 10/05/2009  . Coronary atherosclerosis 10/05/2009  . PVD (peripheral vascular disease) with claudication 10/05/2009  . Hypothyroidism 10/03/2009    Screening Tests Health Maintenance  Topic Date Due  . COLONOSCOPY  10/04/1976  . ZOSTAVAX  10/05/1986  . INFLUENZA VACCINE  12/24/2013  . TETANUS/TDAP  12/24/2013  . PNEUMOCOCCAL POLYSACCHARIDE VACCINE AGE 1 AND OVER  Completed    Immunization History  Administered Date(s) Administered  . DT 10/27/2013  . Pneumococcal Polysaccharide-23 12/25/2003, 07/25/2008  . Td 12/25/2003   Preventative care: Last colonoscopy: declines another Echo 03/2014 CXR 04/2014  Prior vaccinations: TD or Tdap: 2015 Influenza: did not get this year  Pneumococcal: 2010  Prevnar13: DUE TODAY Shingles/Zostavax: declines  Names of Other Physician/Practitioners you currently use: 1. [facility] here for primary care 2. Dr.unknwon , eye doctor, last visit 3 years ago 3. None, dentist, has dentures 4. Dr. Theda Sers, ortho 5. Dr. Percival Spanish heart Dr. Nelva Bush  Patient Care Team: [name], MD as PCP - General (Internal Medicine)   Past Surgical History  Procedure Laterality Date  . Pneumonectomy    . Knee surgery    . Cataract extraction    . Pilonidal cyst / sinus  excision    . Coronary artery bypass graft       2010 Median sternotomy, extracorporeal circulation,   coronary artery bypass graft surgery x3 using a left internal mammary   artery graft to left anterior descending coronary artery, with a    saphenous vein graft to the intermediate coronary artery, and a   saphenous vein graft to the right coronary artery.  Endoscopic vein   harvesting from the left leg.    Family History  Problem Relation Age of Onset  . Heart disease Mother   . Pneumonia Father     History reviewed: allergies, current medications, past family history, past medical history, past social history, past surgical history and problem list  Risk Factors: Tobacco History  Substance Use Topics  . Smoking status: Former Smoker    Quit date: 06/23/2002  . Smokeless tobacco: Not on file  . Alcohol Use: No   He does not smoke.  Patient is  a former smoker. Are there smokers in your home (other than you)?  No  Alcohol Current alcohol use: none  Caffeine Current caffeine use: coffee 1 /day  Exercise Current exercise: none  Nutrition/Diet Current diet: in general, a "healthy" diet    Cardiac risk factors: advanced age (older than 34 for men, 48 for women), dyslipidemia, family history of premature cardiovascular disease, hypertension, male gender, obesity (BMI >= 30 kg/m2) and sedentary lifestyle.  Depression Screen (Note: if answer to either of the following is "Yes", a more complete depression screening is indicated)   Q1: Over the past two weeks, have you felt down, depressed or hopeless? No  Q2: Over the past two weeks, have you felt little interest or pleasure in doing things? No  Have you lost interest or pleasure in daily life? No  Do you often feel hopeless? No  Do you cry easily over simple problems? No  Activities of Daily Living In your present state of health, do you have any difficulty performing the following activities?:  Driving? No Managing money?   No Feeding yourself? No Getting from bed to chair? No Climbing a flight of stairs? yes Preparing food and eating?: No Bathing or showering? No Getting dressed: No Getting to the toilet? Yes Using the toilet:No Moving around from place to place: Yes In the past year have you fallen or had a near fall?:Yes   Are you sexually active?  No  Do you have more than one partner?  No  Vision Difficulties: Yes  Hearing Difficulties: Yes Do you often ask people to speak up or repeat themselves? Yes Do you experience ringing or noises in your ears? Yes Do you have difficulty understanding soft or whispered voices? Yes  Cognition  Do you feel that you have a problem with memory?Yes  Do you often misplace items? No  Do you feel safe at home?  Yes  Advanced directives Does patient have a [place]? Yes  Does patient have a Living Will? Yes   Objective:   Blood pressure 128/72, pulse 72, temperature 97.8 F (36.6 C), resp. rate 16, height 5' 11.5" (1.816 m), weight 262 lb (118.842 kg). Body mass index is 36.04 kg/(m^2).  General appearance: alert, no distress, WD/WN, male Cognitive Testing  Alert? Yes  Normal Appearance?Yes  Oriented to person? Yes  Place? Yes   Time? Yes  Recall of three objects?  Yes  Can perform simple calculations? Yes  Displays appropriate judgment?Yes  Can read the correct time from a watch face?Yes  General Appearance: Well nourished, in no apparent distress. Eyes: PERRLA, EOMs, conjunctiva no swelling or erythema Sinuses: No Frontal/maxillary tenderness ENT/Mouth: Ext aud canals clear, TMs without erythema, bulging. No erythema, swelling, or exudate on post pharynx.  Tonsils not swollen or erythematous. Hearing decreased  Neck: Supple, thyroid normal.  Respiratory: Respiratory effort normal, decreased BS bilateral bases  without rales, rhonchi, wheezing or stridor.  Cardio: RRR with systolic murmur. Decreased pulses bilateral with 1+  edema, venous stasis.  Abdomen: Soft, + BS, obese  Non tender, no guarding, rebound, hernias, masses. Lymphatics: Non tender without lymphadenopathy.  Musculoskeletal: Full ROM, 4/5 strength, walks with a cane, antalgic gait.  Skin: +  Ecchymosis on arms Warm, dry without rashes, lesions Neurological: alert, oriented x 3, CN2-12 intact, sensation decreased bilateral legs to 1/3 up shin, DTRs 2+ throughout, no cerebellar signs Psychiatric: normal affect, behavior normal, pleasant   Medicare Attestation I have personally reviewed: The patient's medical and social history Their use of alcohol, tobacco or illicit drugs Their current medications and supplements The patient's functional ability including ADLs,fall risks, home safety risks, cognitive, and hearing and visual impairment Diet and physical activities Evidence for depression or mood disorders  The patient's weight, height, BMI, and visual acuity have been recorded in the chart.  I have made referrals, counseling, and provided education to the patient based on review of the above and I have provided the patient with a written personalized care plan for preventive services.     Vicie Mutters, PA-C   07/28/2014

## 2014-07-29 LAB — HEMOGLOBIN A1C
Hgb A1c MFr Bld: 5.7 % — ABNORMAL HIGH (ref <5.7)
Mean Plasma Glucose: 117 mg/dL — ABNORMAL HIGH (ref <117)

## 2014-07-29 LAB — TSH: TSH: 1.963 u[IU]/mL (ref 0.350–4.500)

## 2014-09-06 DIAGNOSIS — M1711 Unilateral primary osteoarthritis, right knee: Secondary | ICD-10-CM | POA: Diagnosis not present

## 2014-09-26 DIAGNOSIS — Z961 Presence of intraocular lens: Secondary | ICD-10-CM | POA: Diagnosis not present

## 2014-09-26 DIAGNOSIS — H35342 Macular cyst, hole, or pseudohole, left eye: Secondary | ICD-10-CM | POA: Diagnosis not present

## 2014-10-13 ENCOUNTER — Other Ambulatory Visit: Payer: Self-pay | Admitting: Physician Assistant

## 2014-10-18 ENCOUNTER — Ambulatory Visit (INDEPENDENT_AMBULATORY_CARE_PROVIDER_SITE_OTHER): Payer: Medicare Other | Admitting: Podiatry

## 2014-10-18 ENCOUNTER — Encounter: Payer: Self-pay | Admitting: Podiatry

## 2014-10-18 DIAGNOSIS — B351 Tinea unguium: Secondary | ICD-10-CM

## 2014-10-18 DIAGNOSIS — M79676 Pain in unspecified toe(s): Secondary | ICD-10-CM

## 2014-10-18 NOTE — Progress Notes (Signed)
Patient ID: [name], male   DOB: 29-Sep-1924, 81 y.o.   MRN: [id number]  Subjective: 81 y.o.-year-old male returns the office today for painful, elongated, thickened toenails which he is unable to trim himself. Denies any redness or drainage around the nails. Denies any acute changes since last appointment and no new complaints today. Denies any systemic complaints such as fevers, chills, nausea, vomiting.   Objective: AAO 3, NAD DP/PT pulses decreased, CRT less than 3 seconds Chronic bilateral lower extremity edema.  Protective sensation intact with Derrel Nip monofilament, Achilles tendon reflex intact.  Nails hypertrophic, dystrophic, elongated, brittle, discolored 10. ere is tenderness overlying the nails 1-5 bilaterally. There is no surrounding erythema or drainage along the nail sites. No open lesions or pre-ulcerative lesions are identified. Mild interdigital maceration in the interspaces on the right foot.  No other areas of tenderness bilateral lower extremities. No overlying edema, erythema, increased warmth. No pain with calf compression, swelling, warmth, erythema.  Assessment: Patient presents with symptomatic onychomycosis  Plan: -Treatment options including alternatives, risks, complications were discussed -Nails sharply debrided 10 without complication/bleeding. -Castellani's pain applied to the right foot interspaces.  -Discussed daily foot inspection. If there are any changes, to call the office immediately.  -Follow-up in 3 months or sooner if any problems are to arise. In the meantime, encouraged to call the office with any questions, concerns, changes symptoms.

## 2014-10-28 DIAGNOSIS — Z1331 Encounter for screening for depression: Secondary | ICD-10-CM | POA: Insufficient documentation

## 2014-10-28 NOTE — Progress Notes (Signed)
Patient ID: [name], male   DOB: Jul 28, 1924, 81 y.o.   MRN: [id number]   Annual Comprehensive Examination  This very nice 81 y.o. MWM presents for complete physical.  Patient has been followed for HTN, Prediabetes, Hyperlipidemia, and Vitamin D Deficiency. Patient has severe COPD and uses his O2 intermittently at home He is also s/p RU Lobectomy for Cancer in 1994 w/o recurrence . He does admit DOE with minimal activity as walking rapidly of prolonged speaking.    HTN predates since 2005. Patient's BP has been controlled at home.Today's BP: (!) 152/98 mmHg. In 2012 patient underwent emergent CABG. As patient's lifestyle and physical activity is fairly sedentary, he denies any cardiac symptoms as chest pain, palpitations, shortness of breath, dizziness or ankle swelling. He also has CKD2 (GFR 77 ml/min) attributed to his AS & HTCVD.   Patient's hyperlipidemia is not controlled with diet and supplements. Patient denies myalgias or other medication SE's. Last lipids were not at goal -  Total Chol 189; HDL 44; elevated LDL  112; Trig 167 on 07/28/2014.   Patient has Morbid Obesity with BMI 37 and consequent prediabetes since  2012 with an A1c 5.9%. and patient denies reactive hypoglycemic symptoms, visual blurring, diabetic polys or paresthesias. Last A1c was  5.7% on 07/28/2014.   Finally, patient has history of Vitamin D Deficiency of 28 in 2009 and last vitamin D was 51 in June 2015.      Medication Sig  . Cholecalciferol (VITAMIN D-3) 5000 UNITS TABS Take by mouth daily.   . Coenzyme Q10 400 MG CAPS Take 1 capsule by mouth daily.  . cyclobenzaprine (FLEXERIL) 10 MG tablet TAKE ONE-HALF TO ONE TAB THREE TIMES DAILY AS NEEDED FOR MUSCLE SPASM.  Marland Kitchen enalapril (VASOTEC) 20 MG tablet TAKE ONE TAB ONCE DAILY  . fish oil-omega-3 fatty acids 1000 MG capsule Take  daily.    . furosemide (LASIX) 40 MG tablet Take 0.5 tablets (20 mg total) by mouth daily.  . isosorbide mono (IMDUR) 60 MG 24 hr tablet TAKE  ONE AND ONE-HALF TABLETS BY MOUTH ONCE DAILY  . levothyroxine  200 MCG tablet TAKE ONE TABLET BY MOUTH ONCE DAILY  . MAGNESIUM PO Take 250 mg by mouth daily.   . Multiple Vitamin  Take 1 tablet by mouth daily.  Marland Kitchen NITROSTAT 0.4 MG SL tablet Place 1 tablet (0.4 mg total) under the tongue every 5 (five) minutes as needed.  . KCl   20 MEQ tablet Take 20 mEq by mouth daily.   Allergies  Allergen Reactions  . Prednisone    Past Medical History  Diagnosis Date  . Hypothyroidism   . 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  Marland Kitchen Hernia   . Bone spur     and fused vertebrae.  He   is treated by Dr. Theda Sers.  Marland Kitchen CAD (coronary artery disease)     Cath March 2012. 95% left main stenosis. Patent LIMA to the LAD, patent saphenous vein graft to OM, patent saphenous vein graft to the right coronary artery.  Marland Kitchen PVD (peripheral vascular disease)     (90% left iliac stenosis)  . Aortic ectasia       no abdominal aortic aneurysm.  He is followed for this by   Dr. Curt Jews.  . Constipated   . Hypertension   . COPD (chronic obstructive pulmonary disease)   . Hyperlipidemia   . Abdominal aortic aneurysm  3.3 cm  . Pre-diabetes   . Vitamin D deficiency    Health Maintenance  Topic Date Due  . COLONOSCOPY  10/04/1976  . ZOSTAVAX  10/05/1986  . TETANUS/TDAP  12/24/2013  . INFLUENZA VACCINE  12/25/2014  . PNA vac Low Risk Adult  Completed   Immunization History  Administered Date(s) Administered  . DT 10/27/2013  . Pneumococcal Conjugate-13 07/28/2014  . Pneumococcal Polysaccharide-23 12/25/2003, 07/25/2008  . Td 12/25/2003   Past Surgical History  Procedure Laterality Date  . Pneumonectomy    . Knee surgery    . Cataract extraction    . Pilonidal cyst / sinus excision    . Coronary artery bypass graft       2010 Median sternotomy, extracorporeal circulation,   coronary artery bypass graft surgery x3 using a left internal mammary   artery graft to  left anterior descending coronary artery, with a    saphenous vein graft to the intermediate coronary artery, and a   saphenous vein graft to the right coronary artery.  Endoscopic vein   harvesting from the left leg.    Family History  Problem Relation Age of Onset  . Heart disease Mother   . Pneumonia Father    History   Social History  . Marital Status: Married    Spouse Name: N/A  . Number of Children: N/A  . Years of Education: N/A   Occupational History  . Not on file.   Social History Main Topics  . Smoking status: Former Smoker    Quit date: 06/23/2002  . Smokeless tobacco: Not on file  . Alcohol Use: No  . Drug Use: No  . Sexual Activity: Not on file   Social History Narrative   [name] has been married for the last 67 years.  He has one    daughter.  He is a retired Corporate treasurer.  He quit smoking    cigarettes July 2004.  Previous to this, he did    smoke a pack a day for 60 years.  Did not drink alcohol.  He lives with his wife in a multilevel dwelling.  He drives.   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    ROS Constitutional: Denies fever, chills, weight loss/gain, headaches, insomnia,  night sweats or change in appetite. Does c/o fatigue. Eyes: Denies redness, blurred vision, diplopia, discharge, itchy or watery eyes.  ENT: Denies discharge, congestion, post nasal drip, epistaxis, sore throat, earache, hearing loss, dental pain, Tinnitus, Vertigo, Sinus pain or snoring.  Cardio: Denies chest pain, palpitations, irregular heartbeat, syncope, dyspnea, diaphoresis, orthopnea, PND, claudication or edema Respiratory: denies cough, dyspnea, DOE, pleurisy, hoarseness, laryngitis or wheezing.  Gastrointestinal: Denies dysphagia, heartburn, reflux, water brash, pain, cramps, nausea, vomiting, bloating, diarrhea, constipation, hematemesis, melena, hematochezia, jaundice or hemorrhoids Genitourinary: Denies dysuria, frequency, urgency, nocturia, hesitancy,  discharge, hematuria or flank pain Musculoskeletal: Denies arthralgia, myalgia, stiffness, Jt. Swelling, pain, limp or strain/sprain. Denies Falls. Skin: Denies puritis, rash, hives, warts, acne, eczema or change in skin lesion Neuro: No weakness, tremor, incoordination, spasms, paresthesia or pain Psychiatric: Denies confusion, memory loss or sensory loss. Denies Depression. Endocrine: Denies change in weight, skin, hair change, nocturia, and paresthesia, diabetic polys, visual blurring or hyper / hypo glycemic episodes.  Heme/Lymph: No excessive bleeding, bruising or enlarged lymph nodes.  Physical Exam  BP 152/98   Pulse 76  Temp 97.3 F   Resp 18  Ht 5\' 11"    Wt 265 lb  BMI 37.00  General Appearance: Well nourished, in no apparent distress. Eyes: PERRLA, EOMs, conjunctiva no swelling or erythema, normal fundi and vessels. Sinuses: No frontal/maxillary tenderness ENT/Mouth: EACs patent / TMs  nl. Nares clear without erythema, swelling, mucoid exudates. Oral hygiene is good. No erythema, swelling, or exudate. Tongue normal, non-obstructing. Tonsils not swollen or erythematous. Hearing normal.  Neck: Supple, thyroid normal. No bruits, nodes or JVD. Respiratory: Respiratory effort normal.  BS equal and clear bilateral without rales, rhonci, wheezing or stridor. Cardio: Heart sounds are normal with regular rate and rhythm and no murmurs, rubs or gallops. Peripheral pulses are normal and equal bilaterally without edema. No aortic or femoral bruits. Chest: symmetric with normal excursions and percussion.  Abdomen: Flat, soft, with bowel sounds. Nontender, no guarding, rebound, hernias, masses, or organomegaly.  Lymphatics: Non tender without lymphadenopathy.  Genitourinary: No hernias.Testes nl. DRE - prostate nl for age - smooth & firm w/o nodules. Musculoskeletal: Full ROM all peripheral extremities, joint stability, 5/5 strength, and normal gait. Skin: Warm and dry without rashes,  lesions, cyanosis, clubbing or  ecchymosis.  Neuro: Cranial nerves intact, reflexes equal bilaterally. Normal muscle tone, no cerebellar symptoms. Sensation intact.  Pysch: Awake and oriented X 3 with normal affect, insight and judgment appropriate.   Assessment and Plan  1. Essential hypertension  - Microalbumin / creatinine urine ratio - EKG 12-Lead - [place], RETROPERITNL ABD,  LTD  2. Hyperlipidemia  - Lipid panel  3. Prediabetes  - Hemoglobin A1c - Insulin, random  4. Vitamin D deficiency  - Vit D  25 hydroxy  5. Hypothyroidism, unspecified hypothyroidism type  - TSH  6. Atherosclerosis of native coronary artery of native heart without angina pectoris   7. PVD  with claudication   8. Chronic bronchitis   9. Prostate screen  - PSA  10. low fall   11. Medication management  - Urine Microscopic - CBC with Differential/Platelet - BASIC METABOLIC PANEL WITH GFR - Hepatic function panel - Magnesium  12. At low risk for fall   13. Screening for rectal cancer  - POC Hemoccult Bld/Stl (3-Cd Home Screen); Future  14. Depression Screen   - Screen Negative   Continue prudent diet as discussed, weight control, BP monitoring, regular exercise, and medications as discussed.  Discussed med effects and SE's. Routine screening labs and tests as requested with regular follow-up as recommended.  Over 40 minutes of exam, counseling &  chart review was performed

## 2014-10-28 NOTE — Patient Instructions (Signed)

## 2014-10-30 ENCOUNTER — Ambulatory Visit (INDEPENDENT_AMBULATORY_CARE_PROVIDER_SITE_OTHER): Payer: Medicare Other | Admitting: Internal Medicine

## 2014-10-30 ENCOUNTER — Encounter: Payer: Self-pay | Admitting: Internal Medicine

## 2014-10-30 VITALS — BP 152/98 | HR 76 | Temp 97.3°F | Resp 18 | Ht 71.0 in | Wt 265.2 lb

## 2014-10-30 DIAGNOSIS — I10 Essential (primary) hypertension: Secondary | ICD-10-CM | POA: Diagnosis not present

## 2014-10-30 DIAGNOSIS — R7309 Other abnormal glucose: Secondary | ICD-10-CM

## 2014-10-30 DIAGNOSIS — E039 Hypothyroidism, unspecified: Secondary | ICD-10-CM | POA: Diagnosis not present

## 2014-10-30 DIAGNOSIS — I25709 Atherosclerosis of coronary artery bypass graft(s), unspecified, with unspecified angina pectoris: Secondary | ICD-10-CM

## 2014-10-30 DIAGNOSIS — Z9181 History of falling: Secondary | ICD-10-CM

## 2014-10-30 DIAGNOSIS — Z1331 Encounter for screening for depression: Secondary | ICD-10-CM

## 2014-10-30 DIAGNOSIS — R7303 Prediabetes: Secondary | ICD-10-CM

## 2014-10-30 DIAGNOSIS — E782 Mixed hyperlipidemia: Secondary | ICD-10-CM | POA: Diagnosis not present

## 2014-10-30 DIAGNOSIS — Z85118 Personal history of other malignant neoplasm of bronchus and lung: Secondary | ICD-10-CM

## 2014-10-30 DIAGNOSIS — Z1212 Encounter for screening for malignant neoplasm of rectum: Secondary | ICD-10-CM

## 2014-10-30 DIAGNOSIS — I739 Peripheral vascular disease, unspecified: Secondary | ICD-10-CM

## 2014-10-30 DIAGNOSIS — J42 Unspecified chronic bronchitis: Secondary | ICD-10-CM

## 2014-10-30 DIAGNOSIS — Z79899 Other long term (current) drug therapy: Secondary | ICD-10-CM | POA: Diagnosis not present

## 2014-10-30 DIAGNOSIS — E559 Vitamin D deficiency, unspecified: Secondary | ICD-10-CM

## 2014-10-30 LAB — HEMOGLOBIN A1C
HEMOGLOBIN A1C: 5.7 % — AB (ref <5.7)
Mean Plasma Glucose: 117 mg/dL — ABNORMAL HIGH (ref <117)

## 2014-10-30 LAB — HEPATIC FUNCTION PANEL
ALBUMIN: 3.7 g/dL (ref 3.5–5.2)
ALT: 15 U/L (ref 0–53)
AST: 13 U/L (ref 0–37)
Alkaline Phosphatase: 68 U/L (ref 39–117)
BILIRUBIN DIRECT: 0.1 mg/dL (ref 0.0–0.3)
BILIRUBIN TOTAL: 0.5 mg/dL (ref 0.2–1.2)
Indirect Bilirubin: 0.4 mg/dL (ref 0.2–1.2)
TOTAL PROTEIN: 6 g/dL (ref 6.0–8.3)

## 2014-10-30 LAB — TSH: TSH: 21.203 u[IU]/mL — ABNORMAL HIGH (ref 0.350–4.500)

## 2014-10-30 LAB — LIPID PANEL
CHOLESTEROL: 197 mg/dL (ref 0–200)
HDL: 40 mg/dL (ref >=40)
LDL Cholesterol: 118 mg/dL — ABNORMAL HIGH (ref 0–99)
Total CHOL/HDL Ratio: 4.9 Ratio
Triglycerides: 196 mg/dL — ABNORMAL HIGH (ref <150)
VLDL: 39 mg/dL (ref 0–40)

## 2014-10-30 LAB — BASIC METABOLIC PANEL WITH GFR
BUN: 18 mg/dL (ref 6–23)
CALCIUM: 8.8 mg/dL (ref 8.4–10.5)
CO2: 23 mEq/L (ref 19–32)
Chloride: 107 mEq/L (ref 96–112)
Creat: 0.86 mg/dL (ref 0.50–1.35)
GFR, EST NON AFRICAN AMERICAN: 77 mL/min
GFR, Est African American: >89 mL/min
GLUCOSE: 91 mg/dL (ref 70–99)
POTASSIUM: 4 meq/L (ref 3.5–5.3)
Sodium: 141 mEq/L (ref 135–145)

## 2014-10-30 LAB — CBC WITH DIFFERENTIAL/PLATELET
BASOS PCT: 1 % (ref 0–1)
Basophils Absolute: 0.1 10*3/uL (ref 0.0–0.1)
Eosinophils Absolute: 0.3 10*3/uL (ref 0.0–0.7)
Eosinophils Relative: 4 % (ref 0–5)
HCT: 39.7 % (ref 39.0–52.0)
Hemoglobin: 13.1 g/dL (ref 13.0–17.0)
Lymphocytes Relative: 36 % (ref 12–46)
Lymphs Abs: 2.3 10*3/uL (ref 0.7–4.0)
MCH: 29.2 pg (ref 26.0–34.0)
MCHC: 33 g/dL (ref 30.0–36.0)
MCV: 88.4 fL (ref 78.0–100.0)
MPV: 8.7 fL (ref 8.6–12.4)
Monocytes Absolute: 0.6 10*3/uL (ref 0.1–1.0)
Monocytes Relative: 9 % (ref 3–12)
Neutro Abs: 3.3 10*3/uL (ref 1.7–7.7)
Neutrophils Relative %: 50 % (ref 43–77)
Platelets: 185 10*3/uL (ref 150–400)
RBC: 4.49 MIL/uL (ref 4.22–5.81)
RDW: 15.3 % (ref 11.5–15.5)
WBC: 6.5 10*3/uL (ref 4.0–10.5)

## 2014-10-30 LAB — MAGNESIUM: Magnesium: 1.9 mg/dL (ref 1.5–2.5)

## 2014-10-31 LAB — URINALYSIS, MICROSCOPIC ONLY
Bacteria, UA: NONE SEEN
Casts: NONE SEEN
Squamous Epithelial / LPF: NONE SEEN

## 2014-10-31 LAB — MICROALBUMIN / CREATININE URINE RATIO
Creatinine, Urine: 211.7 mg/dL
Microalb Creat Ratio: 3.3 mg/g (ref 0.0–30.0)
Microalb, Ur: 0.7 mg/dL (ref <2.0)

## 2014-10-31 LAB — INSULIN, RANDOM: INSULIN: 41.7 u[IU]/mL — AB (ref 2.0–19.6)

## 2014-10-31 LAB — VITAMIN D 25 HYDROXY (VIT D DEFICIENCY, FRACTURES): Vit D, 25-Hydroxy: 31 ng/mL (ref 30–100)

## 2014-11-07 ENCOUNTER — Other Ambulatory Visit (INDEPENDENT_AMBULATORY_CARE_PROVIDER_SITE_OTHER): Payer: PRIVATE HEALTH INSURANCE | Admitting: *Deleted

## 2014-11-07 DIAGNOSIS — Z1212 Encounter for screening for malignant neoplasm of rectum: Secondary | ICD-10-CM

## 2014-11-07 LAB — POC HEMOCCULT BLD/STL (HOME/3-CARD/SCREEN)
Card #2 Fecal Occult Blod, POC: NEGATIVE
Card #3 Fecal Occult Blood, POC: NEGATIVE
Fecal Occult Blood, POC: NEGATIVE

## 2014-11-15 ENCOUNTER — Ambulatory Visit: Payer: Self-pay | Admitting: Internal Medicine

## 2014-11-16 ENCOUNTER — Other Ambulatory Visit: Payer: Self-pay | Admitting: Physician Assistant

## 2014-12-01 ENCOUNTER — Ambulatory Visit: Payer: Self-pay

## 2014-12-06 ENCOUNTER — Ambulatory Visit (INDEPENDENT_AMBULATORY_CARE_PROVIDER_SITE_OTHER): Payer: Medicare Other | Admitting: *Deleted

## 2014-12-06 DIAGNOSIS — E039 Hypothyroidism, unspecified: Secondary | ICD-10-CM

## 2014-12-06 LAB — TSH: TSH: 5.654 u[IU]/mL — ABNORMAL HIGH (ref 0.350–4.500)

## 2014-12-06 NOTE — Progress Notes (Signed)
Patient ID: [name], male   DOB: 09/30/1924, 81 y.o.   MRN: [id number] Patient in for TSH. Patient taking Levothyroxine 200 mcg 1 tab every day.

## 2015-01-19 ENCOUNTER — Ambulatory Visit: Payer: Medicare Other | Admitting: Podiatry

## 2015-01-19 ENCOUNTER — Encounter: Payer: Self-pay | Admitting: Podiatry

## 2015-01-19 ENCOUNTER — Ambulatory Visit (INDEPENDENT_AMBULATORY_CARE_PROVIDER_SITE_OTHER): Payer: Medicare Other | Admitting: Podiatry

## 2015-01-19 VITALS — BP 173/97 | HR 72 | Resp 20

## 2015-01-19 DIAGNOSIS — M79676 Pain in unspecified toe(s): Secondary | ICD-10-CM | POA: Diagnosis not present

## 2015-01-19 DIAGNOSIS — I739 Peripheral vascular disease, unspecified: Secondary | ICD-10-CM

## 2015-01-19 DIAGNOSIS — B351 Tinea unguium: Secondary | ICD-10-CM | POA: Diagnosis not present

## 2015-01-19 NOTE — Progress Notes (Signed)
Patient ID: [name], male   DOB: [birth year], 81 y.o.   MRN: [id number] Complaint:  Visit Type: Patient returns to my office for continued preventative foot care services. Complaint: Patient states" my nails have grown long and thick and become painful to walk and wear shoes" . The patient presents for preventative foot care services. No changes to ROS  Podiatric Exam: Vascular: dorsalis pedis and posterior tibial pulses are palpable bilateral. Capillary return is immediate. Temperature gradient is WNL. Skin turgor WNL  Sensorium: Normal Semmes Weinstein monofilament test. Normal tactile sensation bilaterally. Nail Exam: Pt has thick disfigured discolored nails with subungual debris noted bilateral entire nail hallux through fifth toenails Ulcer Exam: There is no evidence of ulcer or pre-ulcerative changes or infection. Orthopedic Exam: Muscle tone and strength are WNL. No limitations in general ROM. No crepitus or effusions noted. Foot type and digits show no abnormalities. Bony prominences are unremarkable. Skin: No Porokeratosis. No infection or ulcers  Diagnosis:  Onychomycosis, , Pain in right toe, pain in left toes  Treatment & Plan Procedures and Treatment: Consent by patient was obtained for treatment procedures. The patient understood the discussion of treatment and procedures well. All questions were answered thoroughly reviewed. Debridement of mycotic and hypertrophic toenails, 1 through 5 bilateral and clearing of subungual debris. No ulceration, no infection noted.  Return Visit-Office Procedure: Patient instructed to return to the office for a follow up visit 3 months for continued evaluation and treatment.

## 2015-02-05 ENCOUNTER — Ambulatory Visit (INDEPENDENT_AMBULATORY_CARE_PROVIDER_SITE_OTHER): Payer: Medicare Other | Admitting: Internal Medicine

## 2015-02-05 ENCOUNTER — Encounter: Payer: Self-pay | Admitting: Internal Medicine

## 2015-02-05 VITALS — BP 138/74 | HR 58 | Temp 98.2°F | Resp 20 | Ht 71.0 in | Wt 266.0 lb

## 2015-02-05 DIAGNOSIS — I10 Essential (primary) hypertension: Secondary | ICD-10-CM

## 2015-02-05 DIAGNOSIS — I25709 Atherosclerosis of coronary artery bypass graft(s), unspecified, with unspecified angina pectoris: Secondary | ICD-10-CM

## 2015-02-05 DIAGNOSIS — Z79899 Other long term (current) drug therapy: Secondary | ICD-10-CM

## 2015-02-05 DIAGNOSIS — E559 Vitamin D deficiency, unspecified: Secondary | ICD-10-CM | POA: Diagnosis not present

## 2015-02-05 DIAGNOSIS — R7303 Prediabetes: Secondary | ICD-10-CM

## 2015-02-05 DIAGNOSIS — Z23 Encounter for immunization: Secondary | ICD-10-CM | POA: Diagnosis not present

## 2015-02-05 DIAGNOSIS — R7309 Other abnormal glucose: Secondary | ICD-10-CM | POA: Diagnosis not present

## 2015-02-05 DIAGNOSIS — E782 Mixed hyperlipidemia: Secondary | ICD-10-CM | POA: Diagnosis not present

## 2015-02-05 DIAGNOSIS — E039 Hypothyroidism, unspecified: Secondary | ICD-10-CM

## 2015-02-05 LAB — CBC WITH DIFFERENTIAL/PLATELET
Basophils Absolute: 0.1 10*3/uL (ref 0.0–0.1)
Basophils Relative: 1 % (ref 0–1)
EOS ABS: 0.2 10*3/uL (ref 0.0–0.7)
Eosinophils Relative: 3 % (ref 0–5)
HCT: 39.1 % (ref 39.0–52.0)
HEMOGLOBIN: 13.1 g/dL (ref 13.0–17.0)
Lymphocytes Relative: 41 % (ref 12–46)
Lymphs Abs: 3.4 10*3/uL (ref 0.7–4.0)
MCH: 29.3 pg (ref 26.0–34.0)
MCHC: 33.5 g/dL (ref 30.0–36.0)
MCV: 87.5 fL (ref 78.0–100.0)
MONO ABS: 0.7 10*3/uL (ref 0.1–1.0)
MONOS PCT: 9 % (ref 3–12)
MPV: 8.8 fL (ref 8.6–12.4)
Neutro Abs: 3.8 10*3/uL (ref 1.7–7.7)
Neutrophils Relative %: 46 % (ref 43–77)
PLATELETS: 211 10*3/uL (ref 150–400)
RBC: 4.47 MIL/uL (ref 4.22–5.81)
RDW: 14.2 % (ref 11.5–15.5)
WBC: 8.3 10*3/uL (ref 4.0–10.5)

## 2015-02-05 LAB — BASIC METABOLIC PANEL WITH GFR
BUN: 12 mg/dL (ref 7–25)
CALCIUM: 9.1 mg/dL (ref 8.6–10.3)
CO2: 24 mmol/L (ref 20–31)
CREATININE: 0.84 mg/dL (ref 0.70–1.11)
Chloride: 106 mmol/L (ref 98–110)
GFR, Est African American: >89 mL/min (ref >=60)
GFR, Est Non African American: 78 mL/min (ref >=60)
Glucose, Bld: 92 mg/dL (ref 65–99)
Potassium: 4.1 mmol/L (ref 3.5–5.3)
SODIUM: 140 mmol/L (ref 135–146)

## 2015-02-05 LAB — HEPATIC FUNCTION PANEL
ALT: 20 U/L (ref 9–46)
AST: 16 U/L (ref 10–35)
Albumin: 3.8 g/dL (ref 3.6–5.1)
Alkaline Phosphatase: 82 U/L (ref 40–115)
BILIRUBIN DIRECT: 0.1 mg/dL (ref <=0.2)
Indirect Bilirubin: 0.6 mg/dL (ref 0.2–1.2)
Total Bilirubin: 0.7 mg/dL (ref 0.2–1.2)
Total Protein: 6.2 g/dL (ref 6.1–8.1)

## 2015-02-05 LAB — LIPID PANEL
CHOL/HDL RATIO: 6.2 ratio — AB (ref <=5.0)
CHOLESTEROL: 204 mg/dL — AB (ref 125–200)
HDL: 33 mg/dL — ABNORMAL LOW (ref >=40)
LDL Cholesterol: 118 mg/dL (ref <130)
Triglycerides: 263 mg/dL — ABNORMAL HIGH (ref <150)
VLDL: 53 mg/dL — ABNORMAL HIGH (ref <30)

## 2015-02-05 LAB — MAGNESIUM: Magnesium: 2 mg/dL (ref 1.5–2.5)

## 2015-02-05 LAB — TSH: TSH: 0.57 u[IU]/mL (ref 0.350–4.500)

## 2015-02-05 MED ORDER — ISOSORBIDE MONONITRATE ER 60 MG PO TB24: 90.0000 mg | ORAL_TABLET | Freq: Every day | ORAL | Status: AC

## 2015-02-05 NOTE — Progress Notes (Signed)
Patient ID: [name], male   DOB: May 05, 1925, 82 y.o.   MRN: [id number]  Assessment and Plan:  Hypertension:  -Continue medication,  -monitor blood pressure at home.  -Continue DASH diet.   -Reminder to go to the ER if any CP, SOB, nausea, dizziness, severe HA, changes vision/speech, left arm numbness and tingling, and jaw pain.  Cholesterol: -Continue diet and exercise.  -Check cholesterol.   Pre-diabetes: -Continue diet and exercise.  -Check A1C  Vitamin D Def: -check level -continue medications.   Leg Edema -missed 2 doses of lasix and needs to take dose -recommended wearing compression stockings again -recommended watching weight for fluid overload   Continue diet and meds as discussed. Further disposition pending results of labs.  HPI 81 y.o. male  presents for 3 month follow up with hypertension, hyperlipidemia, prediabetes and vitamin D.   His blood pressure has been controlled at home, today their BP is BP: 138/74 mmHg.   He does workout. He denies chest pain, shortness of breath, dizziness.   He is not on cholesterol medication and denies myalgias. His cholesterol is not at goal. The cholesterol last visit was:   Lab Results  Component Value Date   CHOL 197 10/30/2014   HDL 40 10/30/2014   LDLCALC 118* 10/30/2014   TRIG 196* 10/30/2014   CHOLHDL 4.9 10/30/2014     He has been working on diet and exercise for prediabetes, and denies foot ulcerations, hyperglycemia, hypoglycemia , increased appetite, nausea, paresthesia of the feet, polydipsia, polyuria, visual disturbances, vomiting and weight loss. Last A1C in the office was:  Lab Results  Component Value Date   HGBA1C 5.7* 10/30/2014    Patient is on Vitamin D supplement.  Lab Results  Component Value Date   VD25OH 31 10/30/2014      Current Medications:  Current Outpatient Prescriptions on File Prior to Visit  Medication Sig Dispense Refill  . Cholecalciferol (VITAMIN D-3) 5000 UNITS TABS Take  by mouth daily.     . Coenzyme Q10 400 MG CAPS Take 1 capsule by mouth daily.    . cyclobenzaprine (FLEXERIL) 10 MG tablet TAKE ONE-HALF TO ONE TABLET BY MOUTH THREE TIMES DAILY AS NEEDED FOR MUSCLE SPASM 90 tablet 0  . enalapril (VASOTEC) 20 MG tablet TAKE ONE TABLET BY MOUTH ONCE DAILY 90 tablet 3  . fish oil-omega-3 fatty acids 1000 MG capsule Take 1 g by mouth daily.      . furosemide (LASIX) 40 MG tablet Take 0.5 tablets (20 mg total) by mouth daily. 30 tablet 0  . isosorbide mononitrate (IMDUR) 60 MG 24 hr tablet TAKE ONE AND ONE-HALF TABLETS BY MOUTH ONCE DAILY 45 tablet 10  . levothyroxine (SYNTHROID, LEVOTHROID) 200 MCG tablet TAKE ONE TABLET BY MOUTH ONCE DAILY 90 tablet 1  . MAGNESIUM PO Take 250 mg by mouth daily.     . Multiple Vitamin (MULTIVITAMIN) tablet Take 1 tablet by mouth daily.    . nitroGLYCERIN (NITROSTAT) 0.4 MG SL tablet Place 1 tablet (0.4 mg total) under the tongue every 5 (five) minutes as needed. 25 tablet 11  . potassium chloride SA (K-DUR,KLOR-CON) 20 MEQ tablet Take 20 mEq by mouth daily.    . traMADol (ULTRAM) 50 MG tablet Take by mouth every 6 (six) hours as needed.     No current facility-administered medications on file prior to visit.    Medical History:  Past Medical History  Diagnosis Date  . Hypothyroidism   . Skin cancer  recent squamous cell carcinoma removed from right   index finger.  Marland Kitchen Hernia   . Bone spur     and fused vertebrae.  He   is treated by Dr. Theda Sers.  Marland Kitchen CAD (coronary artery disease)     Cath March 2012. 95% left main stenosis. Patent LIMA to the LAD, patent saphenous vein graft to OM, patent saphenous vein graft to the right coronary artery.  Marland Kitchen PVD (peripheral vascular disease)     (90% left iliac stenosis)  . Aortic ectasia       no abdominal aortic aneurysm.  He is followed for this by   Dr. Curt Jews.  . Constipated   . Hypertension   . COPD (chronic obstructive pulmonary disease)   . Hyperlipidemia   . Abdominal  aortic aneurysm     3.3 cm  . Pre-diabetes   . Vitamin D deficiency     Allergies:  Allergies  Allergen Reactions  . Prednisone      Review of Systems:  Review of Systems  Constitutional: Negative for fever, chills and malaise/fatigue.  HENT: Negative for congestion, ear discharge and sore throat.   Eyes: Positive for blurred vision.  Respiratory: Negative for cough, shortness of breath and wheezing.   Cardiovascular: Positive for leg swelling. Negative for chest pain and palpitations.  Gastrointestinal: Negative for heartburn, diarrhea, constipation, blood in stool and melena.  Musculoskeletal: Positive for joint pain.  Skin: Negative.   Neurological: Negative for dizziness, sensory change, loss of consciousness and headaches.  Psychiatric/Behavioral: Negative for depression. The patient is not nervous/anxious and does not have insomnia.     Family history- Review and unchanged  Social history- Review and unchanged  Physical Exam: BP 138/74 mmHg  Pulse 58  Temp(Src) 98.2 F (36.8 C) (Temporal)  Resp 20  Ht 5\' 11"  (1.803 m)  Wt 266 lb (120.657 kg)  BMI 37.12 kg/m2 Wt Readings from Last 3 Encounters:  02/05/15 266 lb (120.657 kg)  10/30/14 265 lb 3.2 oz (120.294 kg)  07/28/14 262 lb (118.842 kg)    General Appearance: Well nourished well developed, in no apparent distress. Eyes: PERRLA, EOMs, conjunctiva no swelling or erythema ENT/Mouth: Ear canals normal without obstruction, swelling, erythma, discharge.  TMs normal bilaterally.  Oropharynx moist, clear, without exudate, or postoropharyngeal swelling. Neck: Supple, thyroid normal,no cervical adenopathy  Respiratory: Respiratory effort normal, Breath sounds clear A&P without rhonchi, wheeze, or rale.  No retractions, no accessory usage. Cardio: RRR with no MRGs. Brisk peripheral pulses with 2+ edema pretibially Abdomen: Soft, + BS,  Non tender, no guarding, rebound, hernias, masses. Musculoskeletal: Full ROM, 5/5  strength, antalgic slow gait with cane Skin: Warm, dry without rashes, lesions, ecchymosis.  Neuro: Awake and oriented X 3, Cranial nerves intact. Normal muscle tone, no cerebellar symptoms. Psych: Normal affect, Insight and Judgment appropriate.    [name], PA-C 2:47 PM [facility]

## 2015-02-05 NOTE — Addendum Note (Signed)
Addended by: Sherry Blackard A on: 02/05/2015 03:21 PM   Modules accepted: Orders

## 2015-02-06 LAB — HEMOGLOBIN A1C
Hgb A1c MFr Bld: 5.6 % (ref <5.7)
Mean Plasma Glucose: 114 mg/dL (ref <117)

## 2015-02-06 LAB — INSULIN, RANDOM: INSULIN: 15.2 u[IU]/mL (ref 2.0–19.6)

## 2015-02-06 LAB — VITAMIN D 25 HYDROXY (VIT D DEFICIENCY, FRACTURES): VIT D 25 HYDROXY: 31 ng/mL (ref 30–100)

## 2015-04-09 DIAGNOSIS — H7102 Cholesteatoma of attic, left ear: Secondary | ICD-10-CM | POA: Diagnosis not present

## 2015-04-09 DIAGNOSIS — H6123 Impacted cerumen, bilateral: Secondary | ICD-10-CM | POA: Diagnosis not present

## 2015-04-12 ENCOUNTER — Ambulatory Visit: Payer: Medicare Other | Admitting: Podiatry

## 2015-05-10 ENCOUNTER — Ambulatory Visit (INDEPENDENT_AMBULATORY_CARE_PROVIDER_SITE_OTHER): Payer: Medicare Other | Admitting: Internal Medicine

## 2015-05-10 ENCOUNTER — Encounter: Payer: Self-pay | Admitting: Internal Medicine

## 2015-05-10 VITALS — BP 178/88 | HR 76 | Temp 97.5°F | Resp 20 | Ht 71.0 in | Wt 262.2 lb

## 2015-05-10 DIAGNOSIS — I25708 Atherosclerosis of coronary artery bypass graft(s), unspecified, with other forms of angina pectoris: Secondary | ICD-10-CM

## 2015-05-10 DIAGNOSIS — E559 Vitamin D deficiency, unspecified: Secondary | ICD-10-CM | POA: Diagnosis not present

## 2015-05-10 DIAGNOSIS — R7309 Other abnormal glucose: Secondary | ICD-10-CM | POA: Diagnosis not present

## 2015-05-10 DIAGNOSIS — J439 Emphysema, unspecified: Secondary | ICD-10-CM | POA: Diagnosis not present

## 2015-05-10 DIAGNOSIS — Z79899 Other long term (current) drug therapy: Secondary | ICD-10-CM | POA: Diagnosis not present

## 2015-05-10 DIAGNOSIS — I10 Essential (primary) hypertension: Secondary | ICD-10-CM

## 2015-05-10 DIAGNOSIS — E039 Hypothyroidism, unspecified: Secondary | ICD-10-CM

## 2015-05-10 DIAGNOSIS — R7303 Prediabetes: Secondary | ICD-10-CM | POA: Diagnosis not present

## 2015-05-10 DIAGNOSIS — E782 Mixed hyperlipidemia: Secondary | ICD-10-CM

## 2015-05-10 LAB — CBC WITH DIFFERENTIAL/PLATELET
BASOS ABS: 0.1 10*3/uL (ref 0.0–0.1)
BASOS PCT: 1 % (ref 0–1)
Eosinophils Absolute: 0.2 10*3/uL (ref 0.0–0.7)
Eosinophils Relative: 3 % (ref 0–5)
HEMATOCRIT: 41.4 % (ref 39.0–52.0)
HEMOGLOBIN: 13.7 g/dL (ref 13.0–17.0)
LYMPHS PCT: 38 % (ref 12–46)
Lymphs Abs: 3 10*3/uL (ref 0.7–4.0)
MCH: 29.3 pg (ref 26.0–34.0)
MCHC: 33.1 g/dL (ref 30.0–36.0)
MCV: 88.5 fL (ref 78.0–100.0)
MPV: 8.7 fL (ref 8.6–12.4)
Monocytes Absolute: 0.6 10*3/uL (ref 0.1–1.0)
Monocytes Relative: 8 % (ref 3–12)
NEUTROS ABS: 3.9 10*3/uL (ref 1.7–7.7)
NEUTROS PCT: 50 % (ref 43–77)
Platelets: 214 10*3/uL (ref 150–400)
RBC: 4.68 MIL/uL (ref 4.22–5.81)
RDW: 14.6 % (ref 11.5–15.5)
WBC: 7.8 10*3/uL (ref 4.0–10.5)

## 2015-05-10 LAB — TSH: TSH: 12.484 u[IU]/mL — ABNORMAL HIGH (ref 0.350–4.500)

## 2015-05-10 NOTE — Patient Instructions (Signed)

## 2015-05-11 LAB — BASIC METABOLIC PANEL WITH GFR
BUN: 16 mg/dL (ref 7–25)
CALCIUM: 9.1 mg/dL (ref 8.6–10.3)
CHLORIDE: 106 mmol/L (ref 98–110)
CO2: 25 mmol/L (ref 20–31)
Creat: 0.86 mg/dL (ref 0.70–1.11)
GFR, EST NON AFRICAN AMERICAN: 77 mL/min (ref >=60)
GFR, Est African American: >89 mL/min (ref >=60)
GLUCOSE: 86 mg/dL (ref 65–99)
POTASSIUM: 3.9 mmol/L (ref 3.5–5.3)
SODIUM: 141 mmol/L (ref 135–146)

## 2015-05-11 LAB — HEPATIC FUNCTION PANEL
ALK PHOS: 68 U/L (ref 40–115)
ALT: 13 U/L (ref 9–46)
AST: 14 U/L (ref 10–35)
Albumin: 3.8 g/dL (ref 3.6–5.1)
BILIRUBIN DIRECT: 0.1 mg/dL (ref <=0.2)
BILIRUBIN INDIRECT: 0.5 mg/dL (ref 0.2–1.2)
Total Bilirubin: 0.6 mg/dL (ref 0.2–1.2)
Total Protein: 6.3 g/dL (ref 6.1–8.1)

## 2015-05-11 LAB — LIPID PANEL
CHOL/HDL RATIO: 4.9 ratio (ref <=5.0)
CHOLESTEROL: 204 mg/dL — AB (ref 125–200)
HDL: 42 mg/dL (ref >=40)
LDL CALC: 100 mg/dL (ref <130)
Triglycerides: 312 mg/dL — ABNORMAL HIGH (ref <150)
VLDL: 62 mg/dL — AB (ref <30)

## 2015-05-11 LAB — VITAMIN D 25 HYDROXY (VIT D DEFICIENCY, FRACTURES): Vit D, 25-Hydroxy: 32 ng/mL (ref 30–100)

## 2015-05-11 LAB — HEMOGLOBIN A1C
HEMOGLOBIN A1C: 5.8 % — AB (ref <5.7)
Mean Plasma Glucose: 120 mg/dL — ABNORMAL HIGH (ref <117)

## 2015-05-11 LAB — MAGNESIUM: Magnesium: 2 mg/dL (ref 1.5–2.5)

## 2015-05-11 LAB — INSULIN, RANDOM: Insulin: 30 u[IU]/mL — ABNORMAL HIGH (ref 2.0–19.6)

## 2015-05-12 NOTE — Progress Notes (Signed)
Patient ID: [name], male   DOB: 1924/08/11, 81 y.o.   MRN: [id number]   This very nice 81 y.o. MWM prresents for 6 month follow up with Hypertension, Hyperlipidemia, Pre-Diabetes and Vitamin D Deficiency. Patient also has COPD and does use O2 at night.  He had a RLLobectomy for primary Lung Cancer by Dr Arlyce Dice in 2004 w/o known recurrance. O2 sat is 92% today and drops to 87% after ambulating jut 30 feet with patient becoming visibly dyspneic.    Patient is treated for HTN since 2005 & BP has been controlled at home, but todays Today's BP is 178/88 dropping to 142/82 on recheck. Patient has CKD2 (GFR 77 ml/min) consequent of his HTN. Patient has had no complaints of any cardiac type chest pain, palpitations, dyspnea/orthopnea/PND, dizziness, claudication, or dependent edema.   Hyperlipidemia is controlled with diet & meds. Patient denies myalgias or other med SE's. Current Lipids are at goal with Cholesterol 204*; HDL 42; LDL 100;but with elevated Triglycerides 312.   Also, the patient has history of PreDiabetes since 2012 with A1c 5.9% and has had no symptoms of reactive hypoglycemia, diabetic polys, paresthesias or visual blurring.  Current A1c is 5.8%.    Further, the patient also has history of Vitamin D Deficiency of 28 in 2009  and supplements vitamin D sporadically. Current vitamin D is very low at 32.   Medication Sig  . VITAMIN D 5000 UNITS  Take by mouth daily.   . Coenzyme Q10 400 MG  Take 1 capsule by mouth daily.  . cyclobenzaprine  10 MG TAKE ONE-HALF TO ONE TABLET BY MOUTH THREE TIMES DAILY AS NEEDED FOR MUSCLE SPASM  . enalapril 20 MG  TAKE ONE TABLET BY MOUTH ONCE DAILY  . fish oil-omega-3  1000 MG  Take 1 g by mouth daily.    . furosemide (LASIX) 40 MG  Take 0.5 tablets (20 mg total) by mouth daily.  . isosorbide  (IMDUR) 60 MG  Take 1.5 tablets (90 mg total) by mouth daily.  Marland Kitchen levothyroxine  200 MCG TAKE ONE TABLET BY MOUTH ONCE DAILY  . MAGNESIUM PO Take 250 mg by mouth  daily.   . Multiple Vitamin  Take 1 tablet by mouth daily.  Marland Kitchen NITROSTAT 0.4 MG SL Place 1 tablet (0.4 mg total) under the tongue every 5 (five) minutes as needed.  . potassium chloride  20 MEQ  Take 20 mEq by mouth daily.  . traMADol (ULTRAM) 50 MG Take by mouth every 6 (six) hours as needed.   Allergies  Allergen Reactions  . Prednisone    PMHx:   Past Medical History  Diagnosis Date  . Hypothyroidism   . 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  Marland Kitchen Hernia   . Bone spur     and fused vertebrae.  He   is treated by Dr. Theda Sers.  Marland Kitchen CAD (coronary artery disease)     Cath March 2012. 95% left main stenosis. Patent LIMA to the LAD, patent saphenous vein graft to OM, patent saphenous vein graft to the right coronary artery.  Marland Kitchen PVD (peripheral vascular disease) ([facility])     (90% left iliac stenosis)  . Aortic ectasia ([facility])       no abdominal aortic aneurysm.  He is followed for this by   Dr. Curt Jews.  . Constipated   . Hypertension   . COPD (chronic obstructive pulmonary disease) ([place])   .  Hyperlipidemia   . Abdominal aortic aneurysm ([facility])     3.3 cm  . Pre-diabetes   . Vitamin D deficiency    Immunization History  Administered Date(s) Administered  . DT 10/27/2013  . Influenza, High Dose Seasonal PF 02/05/2015  . Pneumococcal Conjugate-13 07/28/2014  . Pneumococcal Polysaccharide-23 12/25/2003, 07/25/2008  . Td 12/25/2003   Past Surgical History  Procedure Laterality Date  . Pneumonectomy    . Knee surgery    . Cataract extraction    . Pilonidal cyst / sinus excision    . Coronary artery bypass graft       2010 Median sternotomy, extracorporeal circulation,   coronary artery bypass graft surgery x3 using a left internal mammary   artery graft to left anterior descending coronary artery, with a    saphenous vein graft to the intermediate coronary artery, and a   saphenous vein graft to the right coronary artery.  Endoscopic vein    harvesting from the left leg.    FHx:    Reviewed / unchanged  SHx:    Reviewed / unchanged  Systems Review:  Constitutional: Denies fever, chills, wt changes, headaches, insomnia, fatigue, night sweats, change in appetite. Eyes: Denies redness, blurred vision, diplopia, discharge, itchy, watery eyes.  ENT: Denies discharge, congestion, post nasal drip, epistaxis, sore throat, earache, hearing loss, dental pain, tinnitus, vertigo, sinus pain, snoring.  CV: Denies chest pain, palpitations, irregular heartbeat, syncope, dyspnea, diaphoresis, orthopnea, PND, claudication or edema. Respiratory: denies cough, dyspnea, DOE, pleurisy, hoarseness, laryngitis, wheezing.  Gastrointestinal: Denies dysphagia, odynophagia, heartburn, reflux, water brash, abdominal pain or cramps, nausea, vomiting, bloating, diarrhea, constipation, hematemesis, melena, hematochezia  or hemorrhoids. Genitourinary: Denies dysuria, frequency, urgency, nocturia, hesitancy, discharge, hematuria or flank pain. Musculoskeletal: Denies arthralgias, myalgias, stiffness, jt. swelling, pain, limping or strain/sprain.  Skin: Denies pruritus, rash, hives, warts, acne, eczema or change in skin lesion(s). Neuro: No weakness, tremor, incoordination, spasms, paresthesia or pain. Psychiatric: Denies confusion, memory loss or sensory loss. Endo: Denies change in weight, skin or hair change.  Heme/Lymph: No excessive bleeding, bruising or enlarged lymph nodes.  Physical Exam  BP 178/88 and was 142/82 on recheck  Pulse 76  Temp 97.5 F   Resp 20  Ht 5\' 11"    Wt 262 lb 3.2 oz     BMI 36.59   Appears over nourished, morbidly obese and in no distress.  Eyes: PERRLA, EOMs, conjunctiva no swelling or erythema. Sinuses: No frontal/maxillary tenderness ENT/Mouth: EAC's clear, TM's nl w/o erythema, bulging. Nares clear w/o erythema, swelling, exudates. Oropharynx clear without erythema or exudates. Oral hygiene is good. Tongue normal, non  obstructing. Hearing intact.  Neck: Supple. Thyroid nl. Car 2+/2+ without bruits, nodes or JVD. Chest: Kyphotic with distant BS  w/o rales, rhonchi, wheezing or stridor.  Cor: Heart sounds normal w/ regular rate and rhythm without sig. murmurs, gallops, clicks, or rubs. Peripheral pulses normal and equal  without edema.  Abdomen: Soft & bowel sounds normal. Non-tender w/o guarding, rebound, hernias, masses, or organomegaly.  Lymphatics: Unremarkable.  Musculoskeletal: Full ROM all peripheral extremities, joint stability, 5/5 strength, and normal gait.  Skin: Warm, dry without exposed rashes, lesions or ecchymosis apparent.  Neuro: Cranial nerves intact, reflexes equal bilaterally. Sensory-motor testing grossly intact. Tendon reflexes grossly intact.  Pysch: Alert & oriented x 3.  Insight and judgement nl & appropriate. No ideations.  Assessment and Plan:  1. Essential hypertension  - TSH  2. Hyperlipidemia  - Lipid panel - TSH  3. Prediabetes  -  Hemoglobin A1c - Insulin, random  4. Vitamin D deficiency  - VITAMIN D 25 Hydroxy   5. Hypothyroidism, unspecified hypothyroidism type   6. Atherosclerosis of coronary artery bypass graft of native heart with stable angina pectoris ([place])   7. Pulmonary emphysema, unspecified emphysema type ([place])   8. Other abnormal glucose  - Hemoglobin A1c - Insulin, random  9. Medication management  - CBC with Differential/Platelet - BASIC METABOLIC PANEL WITH GFR - Hepatic function panel - Magnesium   Recommended regular exercise, BP monitoring, weight control, and discussed med and SE's. Recommended labs to assess and monitor clinical status. Further disposition pending results of labs. Over 30 minutes of exam, counseling, chart review was performed

## 2015-06-14 ENCOUNTER — Encounter: Payer: Self-pay | Admitting: Cardiology

## 2015-06-14 ENCOUNTER — Ambulatory Visit (INDEPENDENT_AMBULATORY_CARE_PROVIDER_SITE_OTHER): Payer: Medicare Other

## 2015-06-14 DIAGNOSIS — E039 Hypothyroidism, unspecified: Secondary | ICD-10-CM | POA: Diagnosis not present

## 2015-06-14 NOTE — Progress Notes (Signed)
Pt presents for TSH recheck. Pt states he takes 1 tablet every day except on Sunday, Does not take anything on that day(Sunday).

## 2015-06-15 LAB — TSH: TSH: 20.491 u[IU]/mL — AB (ref 0.350–4.500)

## 2015-06-21 ENCOUNTER — Other Ambulatory Visit: Payer: Self-pay | Admitting: *Deleted

## 2015-06-21 MED ORDER — CYCLOBENZAPRINE HCL 10 MG PO TABS: ORAL_TABLET | ORAL | Status: DC

## 2015-07-31 ENCOUNTER — Telehealth: Payer: Self-pay | Admitting: *Deleted

## 2015-07-31 NOTE — Telephone Encounter (Signed)
OV notes from 05/10/2015 faxed to [place] for O2 certification at [id number].

## 2015-08-15 ENCOUNTER — Encounter: Payer: Self-pay | Admitting: Physician Assistant

## 2015-08-15 ENCOUNTER — Ambulatory Visit (INDEPENDENT_AMBULATORY_CARE_PROVIDER_SITE_OTHER): Payer: Medicare Other | Admitting: Physician Assistant

## 2015-08-15 VITALS — BP 150/80 | HR 70 | Temp 97.3°F | Resp 16 | Ht 71.0 in | Wt 243.0 lb

## 2015-08-15 DIAGNOSIS — E559 Vitamin D deficiency, unspecified: Secondary | ICD-10-CM

## 2015-08-15 DIAGNOSIS — E039 Hypothyroidism, unspecified: Secondary | ICD-10-CM | POA: Diagnosis not present

## 2015-08-15 DIAGNOSIS — E782 Mixed hyperlipidemia: Secondary | ICD-10-CM

## 2015-08-15 DIAGNOSIS — Z85118 Personal history of other malignant neoplasm of bronchus and lung: Secondary | ICD-10-CM

## 2015-08-15 DIAGNOSIS — J439 Emphysema, unspecified: Secondary | ICD-10-CM

## 2015-08-15 DIAGNOSIS — R296 Repeated falls: Secondary | ICD-10-CM

## 2015-08-15 DIAGNOSIS — I25708 Atherosclerosis of coronary artery bypass graft(s), unspecified, with other forms of angina pectoris: Secondary | ICD-10-CM

## 2015-08-15 DIAGNOSIS — Z0001 Encounter for general adult medical examination with abnormal findings: Secondary | ICD-10-CM | POA: Diagnosis not present

## 2015-08-15 DIAGNOSIS — R6889 Other general symptoms and signs: Secondary | ICD-10-CM

## 2015-08-15 DIAGNOSIS — I10 Essential (primary) hypertension: Secondary | ICD-10-CM

## 2015-08-15 DIAGNOSIS — Z9181 History of falling: Secondary | ICD-10-CM | POA: Insufficient documentation

## 2015-08-15 DIAGNOSIS — Z0000 Encounter for general adult medical examination without abnormal findings: Secondary | ICD-10-CM

## 2015-08-15 DIAGNOSIS — Z79899 Other long term (current) drug therapy: Secondary | ICD-10-CM

## 2015-08-15 DIAGNOSIS — I739 Peripheral vascular disease, unspecified: Secondary | ICD-10-CM

## 2015-08-15 DIAGNOSIS — R7309 Other abnormal glucose: Secondary | ICD-10-CM | POA: Diagnosis not present

## 2015-08-15 MED ORDER — NITROGLYCERIN 0.4 MG/SPRAY TL SOLN: 1.0000 | Status: AC | PRN

## 2015-08-15 NOTE — Progress Notes (Signed)
MEDICARE ANNUAL WELLNESS VISIT AND FOLLOW UP Assessment:   1. Essential hypertension - continue medications, DASH diet, exercise and monitor at home. Call if greater than 130/80.  - CBC with Differential/Platelet - BASIC METABOLIC PANEL WITH GFR - Hepatic function panel  2. Hypothyroidism, unspecified hypothyroidism type Hypothyroidism-check TSH level, continue medications the same, reminded to take on an empty stomach 30-45mins before food.  - TSH  3. Hyperlipidemia -continue medications, check lipids, decrease fatty foods, increase activity.  - Lipid panel  4. Prediabetes Discussed general issues about diabetes pathophysiology and management., Educational material distributed., Suggested low cholesterol diet., Encouraged aerobic exercise., Discussed foot care., Reminded to get yearly retinal exam. - Hemoglobin A1c - HM DIABETES FOOT EXAM  5. Medication management - Magnesium  6. AKI (acute kidney injury) monitor  7. Chronic obstructive pulmonary disease, unspecified COPD, unspecified chronic bronchitis type  continue meds and O2.  Check COPD- mild worsening dyspnea, check CXR, declines CP, O2 good at RA  8. PVD (peripheral vascular disease) with claudication Control blood pressure, cholesterol, glucose, increase exercise.  Increase walking, feet checks, get on ASA  9. Atherosclerosis of native coronary artery of native heart with other form of angina pectoris Control blood pressure, cholesterol, glucose, increase exercise.  Continue cardio follow up Refill NTG  10. Vitamin D deficiency Continue supplement  11.. At high risk for falls Declines PT, continue f/u ortho    Plan:   During the course of the visit the patient was educated and counseled about appropriate screening and preventive services including:    Pneumococcal vaccine   Influenza vaccine  Td vaccine  Screening electrocardiogram  Colorectal cancer screening  Diabetes screening  Glaucoma  screening  Nutrition counseling   Conditions/risks identified: BMI: Discussed weight loss, diet, and increase physical activity.  Increase physical activity: AHA recommends 150 minutes of physical activity a week.  Medications reviewed Diabetes is at goal, ACE/ARB therapy: Yes. Urinary Incontinence is an issue: discussed non pharmacology and pharmacology options.  Fall risk: high- discussed PT, home fall assessment, medications.    Subjective:  [name] is a 82 y.o. male who presents for Medicare Annual Wellness Visit and 3 month follow up for HTN, hyperlipidemia, prediabetes, and vitamin D Def.  Date of last medicare wellness visit was 07/2014  His blood pressure has been controlled at home, today their BP is BP: (!) 150/80 mmHg He does not workout. He denies chest pain but has shortness of breath.  He has ASHD, follows with Dr. Percival Spanish, he has angina but is on imdur, used NTG in last month, had CP while sleeping, pain went away with NTG.   He has COPD and and he is O2 dependent, uses it at night and when he is walking, he is RL lobectomy for primary lung cancer by Dr. Arlyce Dice, ambulating just 30 feet with dyspnea. He is starting to have a hard time remember words, will think of a something and say another word.  He is not on cholesterol medication and denies myalgias. His cholesterol is not at goal. The cholesterol last visit was:   Lab Results  Component Value Date   CHOL 204* 05/10/2015   HDL 42 05/10/2015   LDLCALC 100 05/10/2015   TRIG 312* 05/10/2015   CHOLHDL 4.9 05/10/2015   He has been working on diet and exercise for prediabetes, and denies polydipsia, polyuria and visual disturbances. Last A1C in the office was:  Lab Results  Component Value Date   HGBA1C 5.8* 05/10/2015  Patient is on Vitamin D supplement.   Lab Results  Component Value Date   VD25OH 32 05/10/2015     He is on thyroid medication. His medication was changed last visit.   Lab Results   Component Value Date   TSH 20.491* 06/14/2015  .  Decreased hearing BMI is Body mass index is 33.91 kg/(m^2)., he is working on diet and exercise. Wt Readings from Last 3 Encounters:  08/15/15 243 lb (110.224 kg)  05/10/15 262 lb 3.2 oz (118.933 kg)  02/05/15 266 lb (120.657 kg)   He walks with a cane, has been seeing Dr. Theda Sers for injection in his right knee  Medication Review: Current Outpatient Prescriptions on File Prior to Visit  Medication Sig Dispense Refill  . Cholecalciferol (VITAMIN D-3) 5000 UNITS TABS Take by mouth daily.     . Coenzyme Q10 400 MG CAPS Take 1 capsule by mouth daily.    . cyclobenzaprine (FLEXERIL) 10 MG tablet TAKE ONE-HALF TO ONE TABLET BY MOUTH THREE TIMES DAILY AS NEEDED FOR MUSCLE SPASM 90 tablet 0  . enalapril (VASOTEC) 20 MG tablet TAKE ONE TABLET BY MOUTH ONCE DAILY 90 tablet 3  . fish oil-omega-3 fatty acids 1000 MG capsule Take 1 g by mouth daily.      . furosemide (LASIX) 40 MG tablet Take 0.5 tablets (20 mg total) by mouth daily. 30 tablet 0  . isosorbide mononitrate (IMDUR) 60 MG 24 hr tablet Take 1.5 tablets (90 mg total) by mouth daily. 45 tablet 10  . levothyroxine (SYNTHROID, LEVOTHROID) 200 MCG tablet TAKE ONE TABLET BY MOUTH ONCE DAILY 90 tablet 1  . MAGNESIUM PO Take 250 mg by mouth daily.     . Multiple Vitamin (MULTIVITAMIN) tablet Take 1 tablet by mouth daily.    . nitroGLYCERIN (NITROSTAT) 0.4 MG SL tablet Place 1 tablet (0.4 mg total) under the tongue every 5 (five) minutes as needed. 25 tablet 11  . potassium chloride SA (K-DUR,KLOR-CON) 20 MEQ tablet Take 20 mEq by mouth daily.    . traMADol (ULTRAM) 50 MG tablet Take by mouth every 6 (six) hours as needed.     No current facility-administered medications on file prior to visit.    Current Problems (verified) Patient Active Problem List   Diagnosis Date Noted  . History of lung cancer, RUL (1994)  10/30/2014  . low fall 10/28/2014  . Syncope 04/21/2014  . COPD (chronic  obstructive pulmonary disease) ([place]) 04/21/2014  . Essential hypertension 10/27/2013  . Prediabetes 10/27/2013  . Medication management 10/27/2013  . Vitamin D deficiency 10/27/2013  . Hyperlipidemia 10/05/2009  . ASCAD / CABG (2010)  10/05/2009  . PVD (peripheral vascular disease) with claudication ([place]) 10/05/2009  . Hypothyroidism 10/03/2009    Screening Tests Immunization History  Administered Date(s) Administered  . DT 10/27/2013  . Influenza, High Dose Seasonal PF 02/05/2015  . Pneumococcal Conjugate-13 07/28/2014  . Pneumococcal Polysaccharide-23 12/25/2003, 07/25/2008  . Td 12/25/2003   Preventative care: Last colonoscopy: declines another Echo 03/2014 CXR 04/2014  Prior vaccinations: TD or Tdap: 2015 Influenza: 2016  Pneumococcal: 2010 Prevnar13: 2016 Shingles/Zostavax: declines  Names of Other Physician/Practitioners you currently use: 1. [facility] here for primary care 2. Dr.unknwon , eye doctor, last visit 3 years ago 3. None, dentist, has dentures 4. Dr. Theda Sers, ortho Patient Care Team: [name], MD as PCP - General (Internal Medicine) Minus Breeding, MD as Consulting Physician (Cardiology) Suella Broad, MD as Consulting Physician (Physical Medicine and Rehabilitation)  Past Surgical History  Procedure Laterality Date  . Pneumonectomy    . Knee surgery    . Cataract extraction    . Pilonidal cyst / sinus excision    . Coronary artery bypass graft       2010 Median sternotomy, extracorporeal circulation,   coronary artery bypass graft surgery x3 using a left internal mammary   artery graft to left anterior descending coronary artery, with a    saphenous vein graft to the intermediate coronary artery, and a   saphenous vein graft to the right coronary artery.  Endoscopic vein   harvesting from the left leg.    Family History  Problem Relation Age of Onset  . Heart disease Mother   . Pneumonia Father      History reviewed: allergies, current medications, past family history, past medical history, past social history, past surgical history and problem list  Risk Factors: Tobacco Social History  Substance Use Topics  . Smoking status: Former Smoker    Quit date: 06/23/2002  . Smokeless tobacco: None  . Alcohol Use: No   He does not smoke.  Patient is  a former smoker. Are there smokers in your home (other than you)?  No  Alcohol Current alcohol use: none  Caffeine Current caffeine use: coffee 1 /day  Exercise Current exercise: none  Nutrition/Diet Current diet: in general, a "healthy" diet    Cardiac risk factors: advanced age (older than 7 for men, 97 for women), dyslipidemia, family history of premature cardiovascular disease, hypertension, male gender, obesity (BMI >= 30 kg/m2) and sedentary lifestyle.  Depression Screen (Note: if answer to either of the following is "Yes", a more complete depression screening is indicated)   Q1: Over the past two weeks, have you felt down, depressed or hopeless? No  Q2: Over the past two weeks, have you felt little interest or pleasure in doing things? No  Have you lost interest or pleasure in daily life? No  Do you often feel hopeless? No  Do you cry easily over simple problems? No  Activities of Daily Living In your present state of health, do you have any difficulty performing the following activities?:  Driving? No Managing money?  No Feeding yourself? No Getting from bed to chair? No Climbing a flight of stairs? yes Preparing food and eating?: No Bathing or showering? No Getting dressed: No Getting to the toilet? Yes Using the toilet:No Moving around from place to place: Yes In the past year have you fallen or had a near fall?:Yes  Vision Difficulties: Yes  Hearing Difficulties: Yes Do you often ask people to speak up or repeat themselves? Yes Do you experience ringing or noises in your ears? Yes Do you have  difficulty understanding soft or whispered voices? Yes  Cognition  Do you feel that you have a problem with memory?Yes  Do you often misplace items? No  Do you feel safe at home?  Yes  Advanced directives Does patient have a Van? Yes Does patient have a Living Will? Yes   Objective:   Blood pressure 150/80, pulse 70, temperature 97.3 F (36.3 C), temperature source Temporal, resp. rate 16, height 5\' 11"  (1.803 m), weight 243 lb (110.224 kg), SpO2 97 %. Body mass index is 33.91 kg/(m^2).  General appearance: alert, no distress, WD/WN, male Cognitive Testing  Alert? Yes  Normal Appearance?Yes  Oriented to person? Yes  Place? Yes   Time? Yes  Recall of three objects?  Yes  Can perform simple calculations? Yes  Displays appropriate judgment?Yes  Can read the correct time from a watch face?Yes  General Appearance: Well nourished, in no apparent distress. Eyes: PERRLA, EOMs, conjunctiva no swelling or erythema Sinuses: No Frontal/maxillary tenderness ENT/Mouth: Ext aud canals clear, TMs without erythema, bulging. No erythema, swelling, or exudate on post pharynx.  Tonsils not swollen or erythematous. Hearing decreased  Neck: Supple, thyroid normal.  Respiratory: Respiratory effort normal, decreased BS bilateral bases  without rales, rhonchi, wheezing or stridor. Dsypnea with talking.  Cardio: RRR with systolic murmur. Decreased pulses bilateral, decreased cap refill bialterally,  with 1+ edema, venous stasis without ulcers. .  Abdomen: Soft, + BS, obese  Non tender, no guarding, rebound, hernias, masses. Lymphatics: Non tender without lymphadenopathy.  Musculoskeletal: Full ROM, 4/5 strength, walks with a cane, antalgic gait.  Skin: +  Ecchymosis on arms Warm, dry without rashes, lesions Neurological: alert, oriented x 3, CN2-12 intact, sensation decreased bilateral legs to 1/3 up shin, DTRs 2+ throughout, no cerebellar signs Psychiatric: normal affect,  behavior normal, pleasant   Medicare Attestation I have personally reviewed: The patient's medical and social history Their use of alcohol, tobacco or illicit drugs Their current medications and supplements The patient's functional ability including ADLs,fall risks, home safety risks, cognitive, and hearing and visual impairment Diet and physical activities Evidence for depression or mood disorders  The patient's weight, height, BMI, and visual acuity have been recorded in the chart.  I have made referrals, counseling, and provided education to the patient based on review of the above and I have provided the patient with a written personalized care plan for preventive services.     Vicie Mutters, PA-C   08/15/2015

## 2015-08-15 NOTE — Patient Instructions (Addendum)
Make sure you are taking your thyroid medication on an empty stomach and please take it the way you are suppose to.    Intermittent Claudication Intermittent claudication is pain in your leg that occurs when you walk or exercise and goes away when you rest. The pain can occur in one or both legs. CAUSES Intermittent claudication is caused by the buildup of plaque within the major arteries in the body (atherosclerosis). The plaque, which makes arteries stiff and narrow, prevents enough blood from reaching your leg muscles. The pain occurs when you walk or exercise because your muscles need more blood when you are moving and exercising. RISK FACTORS Risk factors include:  A family history of atherosclerosis.  A personal history of stroke or heart disease.  Older age.  Being inactive or overweight.  Smoking cigarettes.  Having another health condition such as:  Diabetes.  High blood pressure.  High cholesterol. SIGNS AND SYMPTOMS  Your hip or leg may:   Ache.  Cramp.  Feel tight.  Feel weak.  Feel heavy. Over time, you may feel pain in your calf, thigh, or hip. DIAGNOSIS  Your health care provider may diagnose intermittent claudication based on your symptoms and medical history. Your health care provider may also do tests to learn more about your condition. These may include:  Blood tests.  An ultrasound.  Imaging tests such as angiography, magnetic resonance angiography (MRA), and computed tomography angiography (CTA). TREATMENT You may be treated for problems such as:  High blood pressure.  High cholesterol.  Diabetes. Other treatments may include:  Lifestyle changes such as:  Starting an exercise program.  Losing weight.  Quitting smoking.  Medicines to help restore blood flow through your legs.  Blood vessel surgery (angioplasty) to restore blood flow if your intermittent claudication is caused by severe peripheral artery disease. HOME CARE  INSTRUCTIONS  Manage any other health conditions you have.  Eat a diet low in saturated fats and calories to maintain a healthy weight.  Quit smoking, if you smoke.  Take medicines only as directed by your health care provider.  If your health care provider recommended an exercise program for you, follow it as directed. Your exercise program may involve:  Walking three or more times a week.  Walking until you have certain symptoms of intermittent claudication.  Resting until symptoms go away.  Gradually increasing walking time to about 50 minutes a day. SEEK MEDICAL CARE IF: Your condition is not getting better or is getting worse. SEEK IMMEDIATE MEDICAL CARE IF:   You have chest pain.  You have difficulty breathing.  You develop arm weakness.  You have trouble speaking.  Your face begins to droop. MAKE SURE YOU:  Understand these instructions.  Will watch your condition.  Will get help if you are not doing well or get worse.   This information is not intended to replace advice given to you by your health care provider. Make sure you discuss any questions you have with your health care provider.   Document Released: 03/14/2004 Document Revised: 06/02/2014 Document Reviewed: 08/18/2013 Elsevier Interactive Patient Education [employer].

## 2015-08-16 LAB — BASIC METABOLIC PANEL WITH GFR
BUN: 16 mg/dL (ref 7–25)
CALCIUM: 9.2 mg/dL (ref 8.6–10.3)
CO2: 23 mmol/L (ref 20–31)
CREATININE: 0.98 mg/dL (ref 0.70–1.11)
Chloride: 106 mmol/L (ref 98–110)
GFR, EST AFRICAN AMERICAN: 79 mL/min (ref >=60)
GFR, EST NON AFRICAN AMERICAN: 69 mL/min (ref >=60)
GLUCOSE: 96 mg/dL (ref 65–99)
Potassium: 4.1 mmol/L (ref 3.5–5.3)
SODIUM: 140 mmol/L (ref 135–146)

## 2015-08-16 LAB — TSH: TSH: 4.58 m[IU]/L — AB (ref 0.40–4.50)

## 2015-08-16 LAB — CBC WITH DIFFERENTIAL/PLATELET
BASOS PCT: 1 % (ref 0–1)
Basophils Absolute: 0.1 10*3/uL (ref 0.0–0.1)
EOS PCT: 3 % (ref 0–5)
Eosinophils Absolute: 0.3 10*3/uL (ref 0.0–0.7)
HEMATOCRIT: 40.1 % (ref 39.0–52.0)
Hemoglobin: 13.3 g/dL (ref 13.0–17.0)
Lymphocytes Relative: 46 % (ref 12–46)
Lymphs Abs: 3.9 10*3/uL (ref 0.7–4.0)
MCH: 28.9 pg (ref 26.0–34.0)
MCHC: 33.2 g/dL (ref 30.0–36.0)
MCV: 87 fL (ref 78.0–100.0)
MONO ABS: 0.7 10*3/uL (ref 0.1–1.0)
MONOS PCT: 8 % (ref 3–12)
MPV: 9 fL (ref 8.6–12.4)
Neutro Abs: 3.6 10*3/uL (ref 1.7–7.7)
Neutrophils Relative %: 42 % — ABNORMAL LOW (ref 43–77)
Platelets: 208 10*3/uL (ref 150–400)
RBC: 4.61 MIL/uL (ref 4.22–5.81)
RDW: 14.6 % (ref 11.5–15.5)
WBC: 8.5 10*3/uL (ref 4.0–10.5)

## 2015-08-16 LAB — HEPATIC FUNCTION PANEL
ALT: 12 U/L (ref 9–46)
AST: 13 U/L (ref 10–35)
Albumin: 3.8 g/dL (ref 3.6–5.1)
Alkaline Phosphatase: 68 U/L (ref 40–115)
BILIRUBIN INDIRECT: 0.5 mg/dL (ref 0.2–1.2)
Bilirubin, Direct: 0.1 mg/dL (ref <=0.2)
TOTAL PROTEIN: 6.4 g/dL (ref 6.1–8.1)
Total Bilirubin: 0.6 mg/dL (ref 0.2–1.2)

## 2015-08-16 LAB — LIPID PANEL
CHOLESTEROL: 201 mg/dL — AB (ref 125–200)
HDL: 43 mg/dL (ref >=40)
LDL Cholesterol: 116 mg/dL (ref <130)
TRIGLYCERIDES: 210 mg/dL — AB (ref <150)
Total CHOL/HDL Ratio: 4.7 Ratio (ref <=5.0)
VLDL: 42 mg/dL — AB (ref <30)

## 2015-08-16 LAB — HEMOGLOBIN A1C
Hgb A1c MFr Bld: 5.7 % — ABNORMAL HIGH (ref <5.7)
Mean Plasma Glucose: 117 mg/dL — ABNORMAL HIGH (ref <117)

## 2015-08-16 LAB — MAGNESIUM: MAGNESIUM: 1.9 mg/dL (ref 1.5–2.5)

## 2015-08-17 ENCOUNTER — Ambulatory Visit (HOSPITAL_COMMUNITY)
Admission: RE | Admit: 2015-08-17 | Discharge: 2015-08-17 | Disposition: A | Discharge status: Home / Self Care | Payer: Medicare Other | Source: Ambulatory Visit | Attending: Physician Assistant | Admitting: Physician Assistant

## 2015-08-17 DIAGNOSIS — Z951 Presence of aortocoronary bypass graft: Secondary | ICD-10-CM | POA: Insufficient documentation

## 2015-08-17 DIAGNOSIS — R0602 Shortness of breath: Secondary | ICD-10-CM | POA: Diagnosis not present

## 2015-08-17 DIAGNOSIS — Z85118 Personal history of other malignant neoplasm of bronchus and lung: Secondary | ICD-10-CM | POA: Insufficient documentation

## 2015-08-17 DIAGNOSIS — I2510 Atherosclerotic heart disease of native coronary artery without angina pectoris: Secondary | ICD-10-CM | POA: Diagnosis not present

## 2015-08-17 DIAGNOSIS — Z902 Acquired absence of lung [part of]: Secondary | ICD-10-CM | POA: Insufficient documentation

## 2015-08-17 DIAGNOSIS — J439 Emphysema, unspecified: Secondary | ICD-10-CM | POA: Insufficient documentation

## 2015-09-04 IMAGING — CR DG CHEST 2V
2 series · 2 of 2 positions shown · non-contrast
Comparison: Chest x-ray 04/21/2014 and chest CT the same day.

CLINICAL DATA: Delay is, fatigue. Re-evaluate lung since prior
chest x-ray

EXAM:
CHEST  2 VIEW

[view not recorded (1 of 2)]
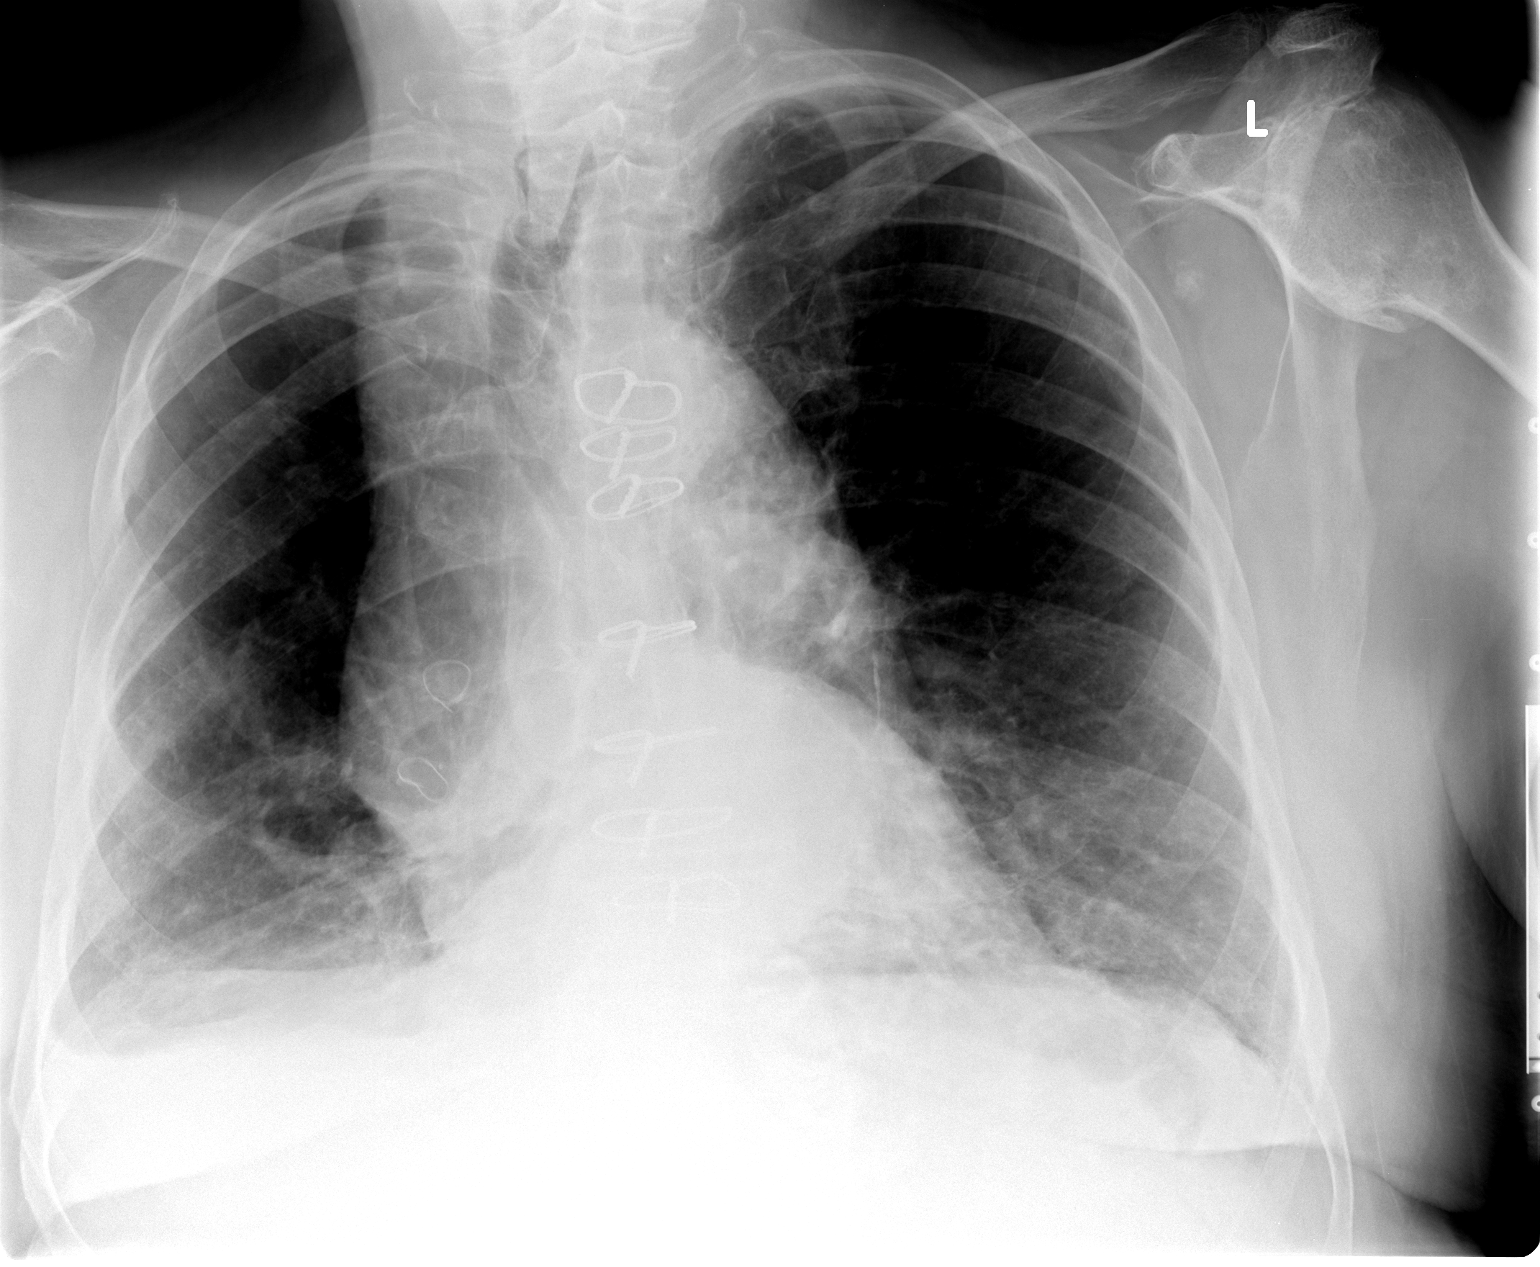

[view not recorded (2 of 2)]
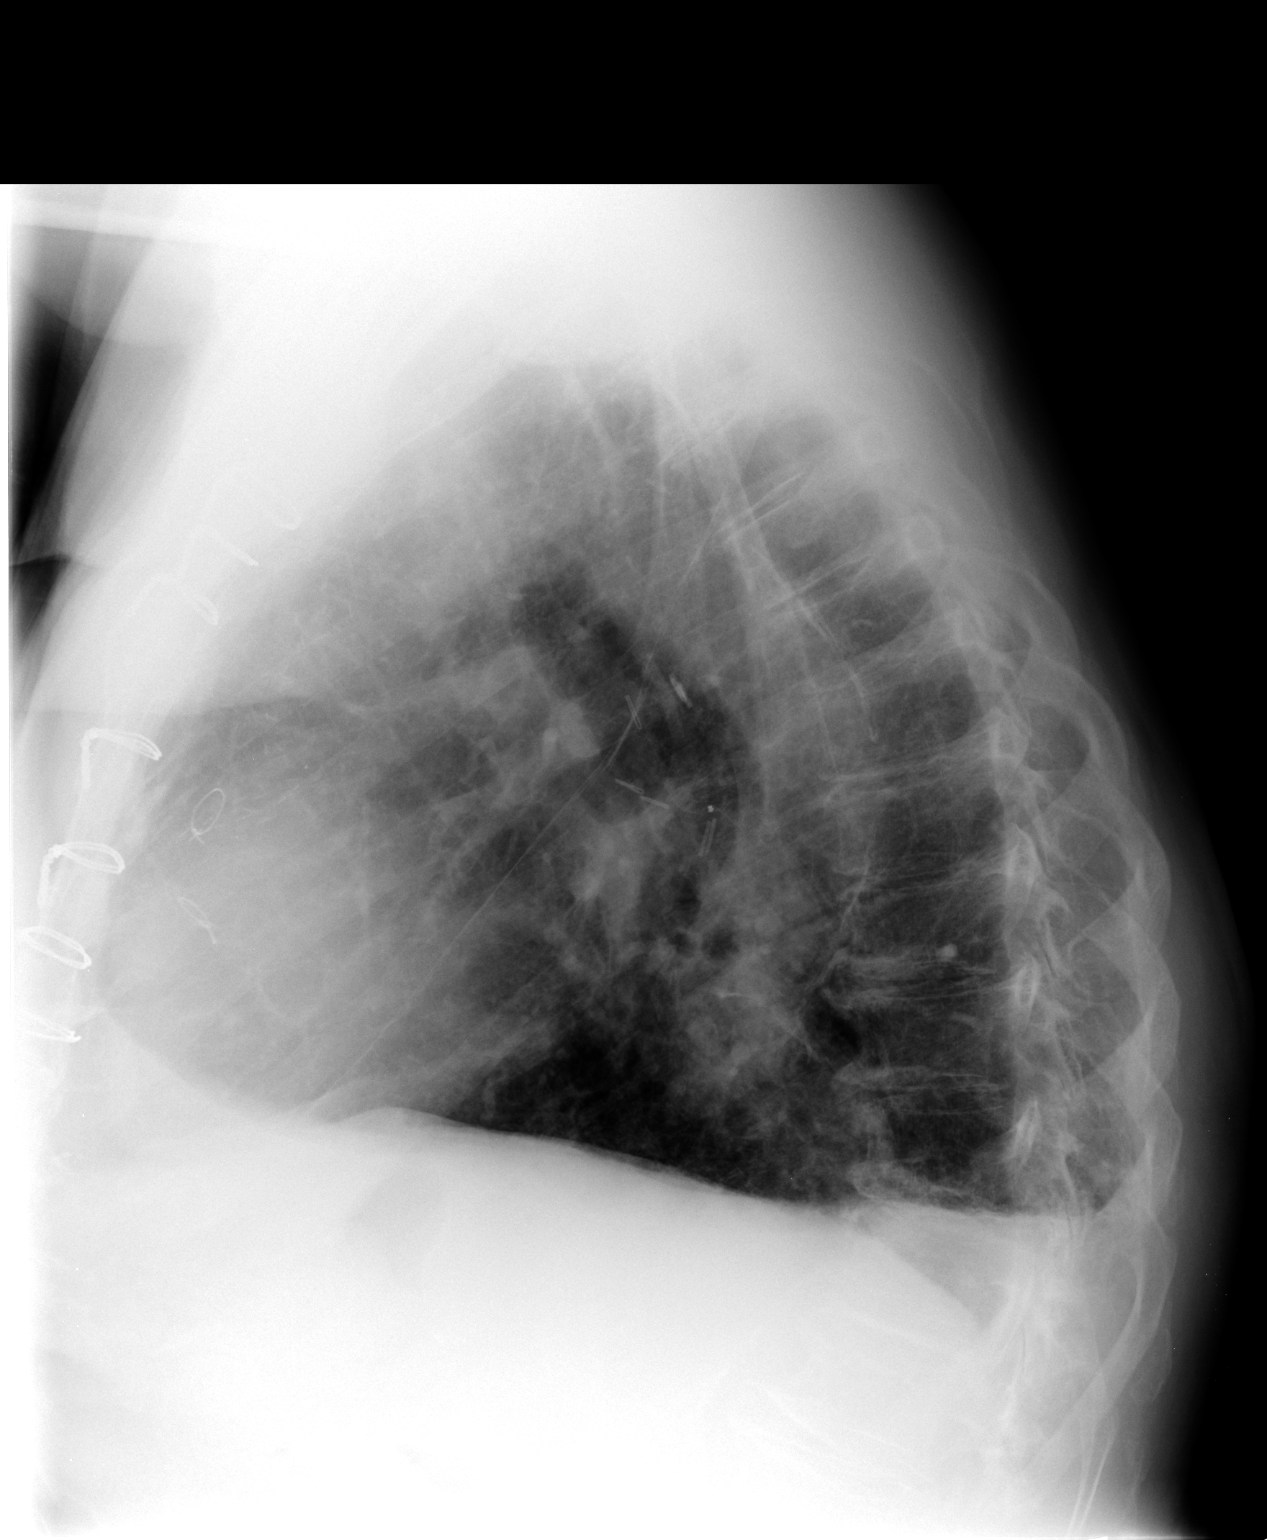

[2 of 2 positions shown; findings below may reference images not displayed]

FINDINGS: There is a small right pleural effusion. Slight improvement in lung
volumes and aeration since prior study. Basilar densities
bilaterally. Linear/interstitial In the right mid lung. Findings
most likely reflect previously seen fibrosis by CT. Heart is normal
size. Tortuosity and ectasia of the thoracic aorta. Prior CABG.
IMPRESSION: Peripheral and bibasilar opacities, improved since prior study with
increasing lung volumes. Persistent densities likely reflect
fibrosis seen on prior CT.

## 2015-10-01 ENCOUNTER — Other Ambulatory Visit: Payer: Self-pay | Admitting: Internal Medicine

## 2015-10-10 DIAGNOSIS — M1711 Unilateral primary osteoarthritis, right knee: Secondary | ICD-10-CM | POA: Diagnosis not present

## 2015-10-10 DIAGNOSIS — R531 Weakness: Secondary | ICD-10-CM | POA: Diagnosis not present

## 2015-10-10 DIAGNOSIS — Z96652 Presence of left artificial knee joint: Secondary | ICD-10-CM | POA: Diagnosis not present

## 2015-10-15 DIAGNOSIS — M1711 Unilateral primary osteoarthritis, right knee: Secondary | ICD-10-CM | POA: Diagnosis not present

## 2015-10-18 DIAGNOSIS — M1711 Unilateral primary osteoarthritis, right knee: Secondary | ICD-10-CM | POA: Diagnosis not present

## 2015-10-24 DIAGNOSIS — M1711 Unilateral primary osteoarthritis, right knee: Secondary | ICD-10-CM | POA: Diagnosis not present

## 2015-10-31 DIAGNOSIS — M1711 Unilateral primary osteoarthritis, right knee: Secondary | ICD-10-CM | POA: Diagnosis not present

## 2015-11-02 DIAGNOSIS — M1711 Unilateral primary osteoarthritis, right knee: Secondary | ICD-10-CM | POA: Diagnosis not present

## 2015-11-05 DIAGNOSIS — M1711 Unilateral primary osteoarthritis, right knee: Secondary | ICD-10-CM | POA: Diagnosis not present

## 2015-11-08 DIAGNOSIS — M1711 Unilateral primary osteoarthritis, right knee: Secondary | ICD-10-CM | POA: Diagnosis not present

## 2015-11-12 ENCOUNTER — Encounter: Payer: Self-pay | Admitting: Internal Medicine

## 2015-11-22 ENCOUNTER — Ambulatory Visit (INDEPENDENT_AMBULATORY_CARE_PROVIDER_SITE_OTHER): Payer: Medicare Other | Admitting: Internal Medicine

## 2015-11-22 ENCOUNTER — Encounter: Payer: Self-pay | Admitting: Internal Medicine

## 2015-11-22 VITALS — BP 172/80 | HR 64 | Temp 97.3°F | Resp 16 | Ht 70.0 in | Wt 250.8 lb

## 2015-11-22 DIAGNOSIS — Z79899 Other long term (current) drug therapy: Secondary | ICD-10-CM

## 2015-11-22 DIAGNOSIS — I25708 Atherosclerosis of coronary artery bypass graft(s), unspecified, with other forms of angina pectoris: Secondary | ICD-10-CM | POA: Diagnosis not present

## 2015-11-22 DIAGNOSIS — E559 Vitamin D deficiency, unspecified: Secondary | ICD-10-CM | POA: Diagnosis not present

## 2015-11-22 DIAGNOSIS — R7309 Other abnormal glucose: Secondary | ICD-10-CM

## 2015-11-22 DIAGNOSIS — Z1212 Encounter for screening for malignant neoplasm of rectum: Secondary | ICD-10-CM

## 2015-11-22 DIAGNOSIS — J449 Chronic obstructive pulmonary disease, unspecified: Secondary | ICD-10-CM | POA: Diagnosis not present

## 2015-11-22 DIAGNOSIS — E782 Mixed hyperlipidemia: Secondary | ICD-10-CM | POA: Diagnosis not present

## 2015-11-22 DIAGNOSIS — N320 Bladder-neck obstruction: Secondary | ICD-10-CM

## 2015-11-22 DIAGNOSIS — I739 Peripheral vascular disease, unspecified: Secondary | ICD-10-CM

## 2015-11-22 DIAGNOSIS — Z136 Encounter for screening for cardiovascular disorders: Secondary | ICD-10-CM | POA: Diagnosis not present

## 2015-11-22 DIAGNOSIS — E039 Hypothyroidism, unspecified: Secondary | ICD-10-CM

## 2015-11-22 DIAGNOSIS — I10 Essential (primary) hypertension: Secondary | ICD-10-CM

## 2015-11-22 DIAGNOSIS — Z125 Encounter for screening for malignant neoplasm of prostate: Secondary | ICD-10-CM

## 2015-11-22 LAB — CBC WITH DIFFERENTIAL/PLATELET
BASOS PCT: 1 %
Basophils Absolute: 75 cells/uL (ref 0–200)
EOS PCT: 3 %
Eosinophils Absolute: 225 cells/uL (ref 15–500)
HCT: 37.7 % — ABNORMAL LOW (ref 38.5–50.0)
Hemoglobin: 12.5 g/dL — ABNORMAL LOW (ref 13.2–17.1)
LYMPHS PCT: 42 %
Lymphs Abs: 3150 cells/uL (ref 850–3900)
MCH: 28.6 pg (ref 27.0–33.0)
MCHC: 33.2 g/dL (ref 32.0–36.0)
MCV: 86.3 fL (ref 80.0–100.0)
MONOS PCT: 9 %
MPV: 8.7 fL (ref 7.5–12.5)
Monocytes Absolute: 675 cells/uL (ref 200–950)
Neutro Abs: 3375 cells/uL (ref 1500–7800)
Neutrophils Relative %: 45 %
PLATELETS: 195 10*3/uL (ref 140–400)
RBC: 4.37 MIL/uL (ref 4.20–5.80)
RDW: 15 % (ref 11.0–15.0)
WBC: 7.5 10*3/uL (ref 3.8–10.8)

## 2015-11-22 NOTE — Patient Instructions (Signed)

## 2015-11-22 NOTE — Progress Notes (Signed)
Patient ID: [name], male   DOB: [birth year], 82 y.o.   MRN: [id number]  [facility] ADULT & ADOLESCENT [facility]NAL MEDICINE   [name], M.D.    [name]. [name], P.A.-C      [name], P.A.-C   [facility]                [address] [place], [place] [id number] Telephone [phone] Telefax [phone] _________________________________ Comprehensive Evaluation & Examination     This very nice 82 y.o. MWM presents for a  comprehensive evaluation and management of multiple medical co-morbidities.  Patient has been followed for HTN, ASHD/CABG,  ASPVD, COPD, Prediabetes, Hyperlipidemia and Vitamin D Deficiency.     HTN predates since 2005. Patient's BP has been controlled and today's BP was elevated at 172/80 after ambulating through the office , but after resting for a period his recheck BP dropped to 146/72.  In 2010, patient underwent CABG  And in 2012 heart cath found patent grafts. Patient denies any cardiac symptoms as chest pain, palpitations, dizziness or ankle swelling. Patient dies have severe COPd using O2 at night and prn during the day with most any activity as he becomes dyspneic with virtually any activity.      Patient's hyperlipidemia is not controlled with diet and supplements. Patient denies myalgias or other medication SE's. Last lipids were not at goal with Cholesterol 201*; HDL 43; elevated LDL 116; and elevated Triglycerides 210 on  08/15/2015.     Patient has prediabetes since 2012 with A1c 5.9% and patient denies reactive hypoglycemic symptoms, visual blurring, diabetic polys or paresthesias. Last A1c was 5.7% on 08/15/2015.      Finally, patient has history of Vitamin D Deficiency of  "28" in 2008 and as he does not supplement as recommended his last vitamin D was still very low at 32 on 05/10/2015.  Medication Sig  . VIT D 5000 UNITS  Take by mouth daily.   . Co Q10 400 MG  Take 1 capsule by mouth daily.   . cyclobenzaprine  10 MG  TAKE ONE-HALF TO ONE TABLET BY MOUTH THREE TIMES DAILY AS NEEDED FOR MUSCLE SPASM  . enalapril  20 MG  TAKE ONE TABLET BY MOUTH ONCE DAILY  . fish oil-omega-3  1000 MG  Take 1 g by mouth daily.    . furosemide  40 MG  Take 0.5 tablets (20 mg total) by mouth daily.  . IMDUR 60 MG  Take 1.5 tablets (90 mg total) by mouth daily.  Marland Kitchen levothyroxine  200 MCG  TAKE ONE TABLET BY MOUTH ONCE DAILY  . MAGNESIUM  Take 250 mg by mouth daily.   . Multiple Vitamin Take 1 tablet by mouth daily.  Marland Kitchen NITROLINGUAL 0.4 MG/SPRAY  Place 1 spray under the tongue every 5 (five) minutes as needed for chest pain.  . potassium chloride  20 MEQ  Take 20 mEq by mouth daily.  . traMADol (ULTRAM) 50 MG  Take by mouth every 6 (six) hours as needed.   Allergies  Allergen Reactions  . Prednisone    Past Medical History  Diagnosis Date  . Hypothyroidism   . 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  Marland Kitchen Hernia   . Bone spur     and fused vertebrae.  He   is treated by Dr. Theda Sers.  Marland Kitchen  CAD (coronary artery disease)     Cath March 2012. 95% left main stenosis. Patent LIMA to the LAD, patent saphenous vein graft to OM, patent saphenous vein graft to the right coronary artery.  Marland Kitchen PVD (peripheral vascular disease) (HCC)     (90% left iliac stenosis)  . Aortic ectasia (HCC)       no abdominal aortic aneurysm.  He is followed for this by Dr. Curt Jews.  . Constipated   . Hypertension   . COPD (chronic obstructive pulmonary disease) ([place])   . Hyperlipidemia   . Abdominal aortic aneurysm (HCC)     3.3 cm  . Pre-diabetes   . Vitamin D deficiency    Health Maintenance  Topic Date Due  . ZOSTAVAX  11/06/2017 (Originally 10/05/1986)  . INFLUENZA VACCINE  12/25/2015  . TETANUS/TDAP  11/11/2023  . PNA vac Low Risk Adult  Completed   Immunization History  Administered Date(s) Administered  . DT 10/27/2013  . Influenza, High Dose Seasonal PF 02/05/2015  .  Pneumococcal Conjugate-13 07/28/2014  . Pneumococcal Polysaccharide-23 12/25/2003, 07/25/2008  . Td 12/25/2003   Past Surgical History  Procedure Laterality Date  . Pneumonectomy    . Knee surgery    . Cataract extraction    . Pilonidal cyst / sinus excision    . Coronary artery bypass graft  2010     2010 Median sternotomy, extracorporeal circulation,   coronary artery bypass graft surgery x3 using a left internal mammary   artery graft to left anterior descending coronary artery, with a  saphenous vein graft to the intermediate coronary artery, and a saphenous vein graft to the right coronary artery.  Endoscopic vein harvesting from the left leg.    Family History  Problem Relation Age of Onset  . Heart disease Mother   . Pneumonia Father    Social History   Social History  . Marital Status: Married    Spouse Name: N/A  . Number of Children: N/A  . Years of Education: N/A   Occupational History  . Retired Chief Strategy Officer   Social History Main Topics  . Smoking status: Former Smoker - 72 yrs x 1-2 ppd    Quit date: 06/23/2002  . Smokeless tobacco: No  . Alcohol Use: No  . Drug Use: No  . Sexual Activity: Not on file    Social History Narrative   Mr. Buckalew has been married for the last 62 years.  He has one    daughter.  He is a retired Corporate treasurer.  He quit smoking    cigarettes July 2004.  Previous to this, he did    smoke a pack a day for 60 years.  Did not drink alcohol.  He lives with his wife in a multilevel dwelling.  He drives.   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    ROS Constitutional: Denies fever, chills, weight loss/gain, headaches, insomnia,  night sweats or change in appetite. Does c/o fatigue. Eyes: Denies redness, blurred vision, diplopia, discharge, itchy or watery eyes.  ENT: Denies discharge, congestion, post nasal drip, epistaxis, sore throat, earache, hearing loss, dental pain, Tinnitus, Vertigo, Sinus pain or snoring.  Cardio: Denies  chest pain, palpitations, irregular heartbeat, syncope, dyspnea, diaphoresis, orthopnea, PND, claudication or edema Respiratory: denies cough, pleurisy, hoarseness, laryngitis or wheezing. Does endorse dyspnea, DOE with minimal activity. Gastrointestinal: Denies dysphagia, heartburn, reflux, water brash, pain, cramps, nausea, vomiting, bloating, diarrhea, constipation, hematemesis, melena, hematochezia, jaundice or hemorrhoids Genitourinary: Denies dysuria,  frequency, urgency, nocturia, hesitancy, discharge, hematuria or flank pain Musculoskeletal: Denies arthralgia, myalgia, stiffness, Jt. Swelling, pain, limp or strain/sprain. Denies Falls. Skin: Denies puritis, rash, hives, warts, acne, eczema or change in skin lesion Neuro: No weakness, tremor, incoordination, spasms, paresthesia or pain Psychiatric: Denies confusion, memory loss or sensory loss. Denies Depression. Endocrine: Denies change in weight, skin, hair change, nocturia, and paresthesia, diabetic polys, visual blurring or hyper / hypo glycemic episodes.  Heme/Lymph: No excessive bleeding, bruising or enlarged lymph nodes.  Physical Exam  BP 172/80 - reck'd at 146/72  Pulse 64  Temp 97.3 F   Resp 16  Ht 5\' 10"    Wt 250 lb 12.8 oz    BMI 35.99  O2 sat was 92%  & dropped to 85% with ambulating about 20 feet on rm air.   General Appearance: Chronically ill appearing obese elderly male in no apparent distress.  Eyes: PERRLA, EOMs, conjunctiva no swelling or erythema, normal fundi and vessels. Sinuses: No frontal/maxillary tenderness ENT/Mouth: EACs patent / TMs  nl. Nares clear without erythema, swelling, mucoid exudates. Oral hygiene is good. No erythema, swelling, or exudate. Tongue normal, non-obstructing. Tonsils not swollen or erythematous. Hearing normal.  Neck: Supple, thyroid normal. No bruits, nodes or JVD. Respiratory: Respiratory effort normal.  BS equal and clear bilateral without rales, rhonci, wheezing or  stridor. Cardio: Heart sounds are normal with regular rate and rhythm and no murmurs, rubs or gallops. Peripheral pulses are normal and equal bilaterally without edema. No aortic or femoral bruits. Chest: symmetric with normal excursions and percussion.  Abdomen: Soft, with Nl bowel sounds. Nontender, no guarding, rebound, hernias, masses, or organomegaly.  Lymphatics: Non tender without lymphadenopathy.  Genitourinary: Deferred for age. Musculoskeletal: Generalized decrease in muscle power, tone & bulk consistent with age & deconditioning. Slow & sl BB gait.  Skin: Warm and dry without rashes, lesions, cyanosis, clubbing or  ecchymosis.  Neuro: Cranial nerves intact, reflexes equal bilaterally. Normal muscle tone, no cerebellar symptoms. Sensation intact.  Pysch: Alert and oriented X 3 with normal affect, insight and judgment appropriate.   Assessment and Plan  1. Essential hypertension  - Microalbumin / creatinine urine ratio - EKG 12-Lead - [place], RETROPERITNL ABD,  LTD - TSH  2. Hyperlipidemia  - Lipid panel - TSH  3. Abnormal glucose  - Hemoglobin A1c - Insulin, random  4. Vitamin D deficiency  - VITAMIN D 25 Hydroxy   5. Hypothyroidism   6. Chronic obstructive pulmonary disease ([place])   7. Atherosclerosis of coronary artery bypass graft with other forms of angina pectoris ([place])   8. PVD (peripheral vascular disease) with claudication ([place])   9. Screening for rectal cancer  - POC Hemoccult Bld/Stl   10. Prostate cancer screening  - PSA  11. Medication management  - Urinalysis, Routine w reflex microscopic  - CBC with Differential/Platelet - BASIC METABOLIC PANEL WITH GFR - Hepatic function panel - Magnesium  12. Bladder neck obstruction  - PSA   Continue prudent diet as discussed, weight control, BP monitoring, regular exercise, and medications as discussed.  Discussed med effects and SE's. Routine screening labs and tests as requested with regular  follow-up as recommended. Over 40 minutes of exam, counseling, chart review and high complex critical decision making was performed

## 2015-11-23 LAB — MICROALBUMIN / CREATININE URINE RATIO
CREATININE, URINE: 150 mg/dL (ref 20–370)
Microalb Creat Ratio: 3 mcg/mg creat (ref <30)
Microalb, Ur: 0.5 mg/dL

## 2015-11-23 LAB — HEPATIC FUNCTION PANEL
ALBUMIN: 4 g/dL (ref 3.6–5.1)
ALK PHOS: 83 U/L (ref 40–115)
ALT: 12 U/L (ref 9–46)
AST: 11 U/L (ref 10–35)
BILIRUBIN INDIRECT: 0.5 mg/dL (ref 0.2–1.2)
BILIRUBIN TOTAL: 0.6 mg/dL (ref 0.2–1.2)
Bilirubin, Direct: 0.1 mg/dL (ref <=0.2)
Total Protein: 6.3 g/dL (ref 6.1–8.1)

## 2015-11-23 LAB — PSA: PSA: 0.76 ng/mL (ref <=4.00)

## 2015-11-23 LAB — BASIC METABOLIC PANEL WITH GFR
BUN: 16 mg/dL (ref 7–25)
CO2: 24 mmol/L (ref 20–31)
Calcium: 9 mg/dL (ref 8.6–10.3)
Chloride: 105 mmol/L (ref 98–110)
Creat: 0.85 mg/dL (ref 0.70–1.11)
GFR, EST AFRICAN AMERICAN: 89 mL/min (ref >=60)
GFR, EST NON AFRICAN AMERICAN: 77 mL/min (ref >=60)
Glucose, Bld: 91 mg/dL (ref 65–99)
POTASSIUM: 4 mmol/L (ref 3.5–5.3)
Sodium: 139 mmol/L (ref 135–146)

## 2015-11-23 LAB — URINALYSIS, ROUTINE W REFLEX MICROSCOPIC
BILIRUBIN URINE: NEGATIVE
GLUCOSE, UA: NEGATIVE
Hgb urine dipstick: NEGATIVE
KETONES UR: NEGATIVE
Leukocytes, UA: NEGATIVE
Nitrite: NEGATIVE
PH: 5.5 (ref 5.0–8.0)
Protein, ur: NEGATIVE
SPECIFIC GRAVITY, URINE: 1.023 (ref 1.001–1.035)

## 2015-11-23 LAB — INSULIN, RANDOM: INSULIN: 8.1 u[IU]/mL (ref 2.0–19.6)

## 2015-11-23 LAB — LIPID PANEL
Cholesterol: 191 mg/dL (ref 125–200)
HDL: 43 mg/dL (ref >=40)
LDL CALC: 115 mg/dL (ref <130)
TRIGLYCERIDES: 167 mg/dL — AB (ref <150)
Total CHOL/HDL Ratio: 4.4 Ratio (ref <=5.0)
VLDL: 33 mg/dL — ABNORMAL HIGH (ref <30)

## 2015-11-23 LAB — HEMOGLOBIN A1C
Hgb A1c MFr Bld: 5.7 % — ABNORMAL HIGH (ref <5.7)
Mean Plasma Glucose: 117 mg/dL

## 2015-11-23 LAB — MAGNESIUM: Magnesium: 2 mg/dL (ref 1.5–2.5)

## 2015-11-23 LAB — VITAMIN D 25 HYDROXY (VIT D DEFICIENCY, FRACTURES): Vit D, 25-Hydroxy: 36 ng/mL (ref 30–100)

## 2015-11-23 LAB — TSH: TSH: 3.64 mIU/L (ref 0.40–4.50)

## 2015-12-05 DIAGNOSIS — Z96652 Presence of left artificial knee joint: Secondary | ICD-10-CM | POA: Diagnosis not present

## 2015-12-05 DIAGNOSIS — M1711 Unilateral primary osteoarthritis, right knee: Secondary | ICD-10-CM | POA: Diagnosis not present

## 2015-12-05 DIAGNOSIS — Z471 Aftercare following joint replacement surgery: Secondary | ICD-10-CM | POA: Diagnosis not present

## 2015-12-10 ENCOUNTER — Other Ambulatory Visit: Payer: Self-pay

## 2015-12-10 DIAGNOSIS — Z1212 Encounter for screening for malignant neoplasm of rectum: Secondary | ICD-10-CM

## 2015-12-10 LAB — POC HEMOCCULT BLD/STL (HOME/3-CARD/SCREEN)
Card #2 Fecal Occult Blod, POC: NEGATIVE
FECAL OCCULT BLD: NEGATIVE
FECAL OCCULT BLD: NEGATIVE

## 2016-02-08 ENCOUNTER — Other Ambulatory Visit: Payer: Self-pay | Admitting: Internal Medicine

## 2016-02-28 ENCOUNTER — Ambulatory Visit (INDEPENDENT_AMBULATORY_CARE_PROVIDER_SITE_OTHER): Payer: Medicare Other | Admitting: Internal Medicine

## 2016-02-28 ENCOUNTER — Encounter: Payer: Self-pay | Admitting: Internal Medicine

## 2016-02-28 VITALS — BP 148/72 | HR 78 | Temp 98.2°F | Resp 18 | Ht 70.0 in | Wt 243.0 lb

## 2016-02-28 DIAGNOSIS — E782 Mixed hyperlipidemia: Secondary | ICD-10-CM

## 2016-02-28 DIAGNOSIS — Z23 Encounter for immunization: Secondary | ICD-10-CM

## 2016-02-28 DIAGNOSIS — E039 Hypothyroidism, unspecified: Secondary | ICD-10-CM | POA: Diagnosis not present

## 2016-02-28 DIAGNOSIS — I25708 Atherosclerosis of coronary artery bypass graft(s), unspecified, with other forms of angina pectoris: Secondary | ICD-10-CM | POA: Diagnosis not present

## 2016-02-28 DIAGNOSIS — R296 Repeated falls: Secondary | ICD-10-CM

## 2016-02-28 DIAGNOSIS — I10 Essential (primary) hypertension: Secondary | ICD-10-CM

## 2016-02-28 DIAGNOSIS — J449 Chronic obstructive pulmonary disease, unspecified: Secondary | ICD-10-CM

## 2016-02-28 DIAGNOSIS — E559 Vitamin D deficiency, unspecified: Secondary | ICD-10-CM | POA: Diagnosis not present

## 2016-02-28 DIAGNOSIS — Z79899 Other long term (current) drug therapy: Secondary | ICD-10-CM

## 2016-02-28 DIAGNOSIS — I739 Peripheral vascular disease, unspecified: Secondary | ICD-10-CM | POA: Diagnosis not present

## 2016-02-28 NOTE — Patient Instructions (Signed)
[facility] are home health companies that do electric wheel chairs.    Please take lasix 40 mg once daily for the next 2 days to help get the fluid off your legs.  Please take it before 12 pm.  This will get it out of your system before bedtime.

## 2016-02-28 NOTE — Progress Notes (Signed)
Assessment and Plan:  Hypertension:  -Continue medication,  -monitor blood pressure at home.  -Continue DASH diet.   -Reminder to go to the ER if any CP, SOB, nausea, dizziness, severe HA, changes vision/speech, left arm numbness and tingling, and jaw pain.  Cholesterol: -Continue diet and exercise.   Pre-diabetes: -Continue diet and exercise.   Vitamin D Def: -continue medications.   CABG/ASCAD -cont cholesterol medications -cont blood pressure medications -ntg if anginal pain -if continued regular use of NTG consider adding in imdur -recommended patient take lasix first thing in the morning.  PVD -compression socks -elevate feet above heart as much as possible  Hypothyroidism -cont levothyroxine   High risk for falls -patient wants new power wheel chair for use at home and shower chair.   -will try to get set up with home health company.  Patient reports that he would like to have his labs done at the cardiologists office as he is set to go this month.  Will have them forwarded to [place].   Continue diet and meds as discussed. Further disposition pending results of labs.  HPI 82 y.o. male  presents for 3 month follow up with hypertension, hyperlipidemia, prediabetes and vitamin D.   His blood pressure has been controlled at home, today their BP is BP: (!) 148/72.   He does not workout. He denies chest pain, shortness of breath, dizziness.   He is on cholesterol medication and denies myalgias. His cholesterol is at goal. The cholesterol last visit was:   Lab Results  Component Value Date   CHOL 191 11/22/2015   HDL 43 11/22/2015   LDLCALC 115 11/22/2015   TRIG 167 (H) 11/22/2015   CHOLHDL 4.4 11/22/2015     He has been working on diet and exercise for prediabetes, and denies foot ulcerations, hyperglycemia, hypoglycemia , increased appetite, nausea, paresthesia of the feet, polydipsia, polyuria, visual disturbances, vomiting and weight loss. Last A1C in the office  was:  Lab Results  Component Value Date   HGBA1C 5.7 (H) 11/22/2015    Patient is on Vitamin D supplement.  Lab Results  Component Value Date   VD25OH 49 11/22/2015     He reports that he has been waking up later and has been going to bed earlier.  He reports that they are not eating as much now because his schedule has changed.  He reports that he moves as much as he can but his knees are really bothersome.  He does fall regularly at home.  He does not have injuries with most falls.  He has fallen in the yard 3 times in the last week.  He does have trouble bathing at home secondary to fear of falling in the tub/shower.    He reports that he is taking his thyroid medication first thing in the morning on an empty stomach.     He is set up with [facility] for Home O2.  He does not take his fluid pill at home like he is supposed to because he feels it keeps him up at nighttime with urinating even if he takes it in the morning.  He does have some shortness of breath and leg swelling.  He is not wearing his compression socks.     Current Medications:  Current Outpatient Prescriptions on File Prior to Visit  Medication Sig Dispense Refill  . Cholecalciferol (VITAMIN D-3) 5000 UNITS TABS Take by mouth daily.     . Coenzyme Q10 400 MG CAPS Take 1 capsule  by mouth daily.    . fish oil-omega-3 fatty acids 1000 MG capsule Take 1 g by mouth daily.      Marland Kitchen levothyroxine (SYNTHROID, LEVOTHROID) 200 MCG tablet TAKE ONE TABLET BY MOUTH ONCE DAILY 90 tablet 1  . MAGNESIUM PO Take 250 mg by mouth daily.     . Multiple Vitamin (MULTIVITAMIN) tablet Take 1 tablet by mouth daily.    . nitroGLYCERIN (NITROLINGUAL) 0.4 MG/SPRAY spray Place 1 spray under the tongue every 5 (five) minutes as needed for chest pain. 4.9 g 3  . potassium chloride SA (K-DUR,KLOR-CON) 20 MEQ tablet Take 20 mEq by mouth daily.    . traMADol (ULTRAM) 50 MG tablet Take by mouth every 6 (six) hours as needed.    . enalapril (VASOTEC) 20  MG tablet TAKE ONE TABLET BY MOUTH ONCE DAILY (Patient not taking: Reported on 02/28/2016) 90 tablet 3   No current facility-administered medications on file prior to visit.     Medical History:  Past Medical History:  Diagnosis Date  . Abdominal aortic aneurysm (HCC)    3.3 cm  . Aortic ectasia (HCC)      no abdominal aortic aneurysm.  He is followed for this by   Dr. Curt Jews.  . Bone spur    and fused vertebrae.  He   is treated by Dr. Theda Sers.  Marland Kitchen CAD (coronary artery disease)    Cath March 2012. 95% left main stenosis. Patent LIMA to the LAD, patent saphenous vein graft to OM, patent saphenous vein graft to the right coronary artery.  . Constipated   . COPD (chronic obstructive pulmonary disease) ([place])   . Hernia   . Hyperlipidemia   . Hypertension   . Hypothyroidism   . Pre-diabetes   . PVD (peripheral vascular disease) (HCC)    (90% left iliac stenosis)  . Skin cancer    recent squamous cell carcinoma removed from right   index finger.  . Vitamin D deficiency     Allergies:  Allergies  Allergen Reactions  . Prednisone      Review of Systems:  Review of Systems  Constitutional: Negative for chills, fever and malaise/fatigue.  HENT: Negative for congestion, ear pain and sore throat.   Eyes: Negative.   Respiratory: Positive for shortness of breath. Negative for cough and wheezing.   Cardiovascular: Positive for chest pain and leg swelling. Negative for palpitations, orthopnea and PND.  Gastrointestinal: Negative for abdominal pain, blood in stool, constipation, diarrhea, heartburn and melena.  Genitourinary: Negative.   Musculoskeletal: Positive for back pain and joint pain.  Skin: Negative.   Neurological: Negative for dizziness, sensory change, loss of consciousness and headaches.  Psychiatric/Behavioral: Negative for depression. The patient is not nervous/anxious and does not have insomnia.     Family history- Review and unchanged  Social history- Review  and unchanged  Physical Exam: BP (!) 148/72   Pulse 78   Temp 98.2 F (36.8 C) (Temporal)   Resp 18   Ht 5\' 10"  (1.778 m)   Wt 243 lb (110.2 kg)   BMI 34.87 kg/m  Wt Readings from Last 3 Encounters:  02/28/16 243 lb (110.2 kg)  11/22/15 250 lb 12.8 oz (113.8 kg)  08/15/15 243 lb (110.2 kg)    General Appearance: Well nourished well developed, in no apparent distress. Eyes: PERRLA, EOMs, conjunctiva no swelling or erythema ENT/Mouth: Ear canals normal without obstruction, swelling, erythma, discharge.  TMs normal bilaterally.  Oropharynx moist, clear, without exudate, or postoropharyngeal swelling.  Neck: Supple, thyroid normal,no cervical adenopathy  Respiratory: Respiratory effort normal, Breath sounds clear A&P without rhonchi, wheeze, or rale.  No retractions, no accessory usage. Cardio: RRR with no RGs. Brisk peripheral pulses with 2+ edema bilaterally in the pretibial area  Abdomen: rotund Soft, + BS,  Non tender, no guarding, rebound, hernias, masses. Musculoskeletal: Full ROM, 5/5 strength, slow shuffling gait with cane which is unsteady. Skin: Warm, dry without rashes, lesions, ecchymosis.  Neuro: Awake and oriented X 3, Cranial nerves intact. Normal muscle tone, no cerebellar symptoms. Psych: Normal affect, Insight and Judgment appropriate.    [name], PA-C 11:02 AM [facility]

## 2016-04-04 DIAGNOSIS — M25511 Pain in right shoulder: Secondary | ICD-10-CM | POA: Diagnosis not present

## 2016-04-04 DIAGNOSIS — M25512 Pain in left shoulder: Secondary | ICD-10-CM | POA: Diagnosis not present

## 2016-04-07 DIAGNOSIS — H7102 Cholesteatoma of attic, left ear: Secondary | ICD-10-CM | POA: Diagnosis not present

## 2016-04-07 DIAGNOSIS — H6123 Impacted cerumen, bilateral: Secondary | ICD-10-CM | POA: Diagnosis not present

## 2016-06-04 ENCOUNTER — Encounter: Payer: Self-pay | Admitting: Internal Medicine

## 2016-06-04 ENCOUNTER — Ambulatory Visit (INDEPENDENT_AMBULATORY_CARE_PROVIDER_SITE_OTHER): Payer: Medicare Other | Admitting: Internal Medicine

## 2016-06-04 VITALS — BP 162/98 | HR 72 | Temp 97.7°F | Resp 16 | Ht 70.0 in | Wt 246.8 lb

## 2016-06-04 DIAGNOSIS — I25118 Atherosclerotic heart disease of native coronary artery with other forms of angina pectoris: Secondary | ICD-10-CM

## 2016-06-04 DIAGNOSIS — E039 Hypothyroidism, unspecified: Secondary | ICD-10-CM | POA: Diagnosis not present

## 2016-06-04 DIAGNOSIS — I739 Peripheral vascular disease, unspecified: Secondary | ICD-10-CM

## 2016-06-04 DIAGNOSIS — E782 Mixed hyperlipidemia: Secondary | ICD-10-CM

## 2016-06-04 DIAGNOSIS — R7303 Prediabetes: Secondary | ICD-10-CM

## 2016-06-04 DIAGNOSIS — Z79899 Other long term (current) drug therapy: Secondary | ICD-10-CM | POA: Diagnosis not present

## 2016-06-04 DIAGNOSIS — I10 Essential (primary) hypertension: Secondary | ICD-10-CM | POA: Diagnosis not present

## 2016-06-04 DIAGNOSIS — J439 Emphysema, unspecified: Secondary | ICD-10-CM | POA: Diagnosis not present

## 2016-06-04 DIAGNOSIS — R7309 Other abnormal glucose: Secondary | ICD-10-CM | POA: Diagnosis not present

## 2016-06-04 DIAGNOSIS — E559 Vitamin D deficiency, unspecified: Secondary | ICD-10-CM

## 2016-06-04 LAB — CBC WITH DIFFERENTIAL/PLATELET
BASOS PCT: 1 %
Basophils Absolute: 97 cells/uL (ref 0–200)
Eosinophils Absolute: 194 cells/uL (ref 15–500)
Eosinophils Relative: 2 %
HCT: 42.2 % (ref 38.5–50.0)
Hemoglobin: 14 g/dL (ref 13.2–17.1)
Lymphocytes Relative: 37 %
Lymphs Abs: 3589 cells/uL (ref 850–3900)
MCH: 29.9 pg (ref 27.0–33.0)
MCHC: 33.2 g/dL (ref 32.0–36.0)
MCV: 90 fL (ref 80.0–100.0)
MONOS PCT: 9 %
MPV: 8.6 fL (ref 7.5–12.5)
Monocytes Absolute: 873 cells/uL (ref 200–950)
Neutro Abs: 4947 cells/uL (ref 1500–7800)
Neutrophils Relative %: 51 %
PLATELETS: 240 10*3/uL (ref 140–400)
RBC: 4.69 MIL/uL (ref 4.20–5.80)
RDW: 15.5 % — ABNORMAL HIGH (ref 11.0–15.0)
WBC: 9.7 10*3/uL (ref 3.8–10.8)

## 2016-06-04 LAB — TSH: TSH: 30.43 mIU/L — ABNORMAL HIGH (ref 0.40–4.50)

## 2016-06-04 LAB — HEMOGLOBIN A1C
Hgb A1c MFr Bld: 5.3 % (ref <5.7)
Mean Plasma Glucose: 105 mg/dL

## 2016-06-04 NOTE — Patient Instructions (Signed)

## 2016-06-04 NOTE — Progress Notes (Signed)
[facility] [name], M.D.        [name], P.A.-C       [name], P.A.-C  [facility]                [address] [place], [place] [id number] Telephone [phone] Telefax [phone] ______________________________________________________________________     This very nice 83 y.o. MWM presents for 6 month follow up with Hypertension, Hyperlipidemia, Morbid Obesity, Pre-Diabetes and Vitamin D Deficiency.      Patient is treated for HTN & BP has been controlled at home. Patient has ASHD and in 2010, he underwent CABG and Heart cath in 2012 showed patent grafts.Today's BP is elevated at 162/98 and rechecked at 142/86. Patient has had no complaints of any cardiac type chest pain, palpitations, dyspnea/orthopnea/PND, dizziness, claudication, or dependent edema. Other problems include COPD with use of Nasal O2 as even minimal activity decompensates respiratory status.      Hyperlipidemia is controlled with diet & meds. Patient denies myalgias or other med SE's. Last Lipids were not at goal: Lab Results  Component Value Date   CHOL 191 11/22/2015   HDL 43 11/22/2015   LDLCALC 115 11/22/2015   TRIG 167 (H) 11/22/2015   CHOLHDL 4.4 11/22/2015      Also, the patient has history of Morbid obesity (BMI 35+) and consequent PreDiabetes circa 2012 with A1c 5.9%  and has had no symptoms of reactive hypoglycemia, diabetic polys, paresthesias or visual blurring.  Last A1c was near goal: Lab Results  Component Value Date   HGBA1C 5.7 (H) 11/22/2015      Further, the patient also has history of Vitamin D Deficiency in 2008 of "28" and is reticent to take recommended Vit D and last several  vitamin D's  was still very low:  Lab Results  Component Value Date   VD25OH 36 11/22/2015   Current Outpatient Prescriptions on File Prior to Visit  Medication Sig  . VITAMIN D 5000 UNITS  Take by mouth  daily.   . Coenzyme Q10 400 MG  Take 1 capsule by mouth daily.  . enalapril  20 MG tablet TAKE ONE TABLET BY MOUTH ONCE DAILY  . fish oil-omega-3  1000 MG Take 1 g by mouth daily.    Marland Kitchen levothyroxine 200 MCG  TAKE ONE TABLET BY MOUTH ONCE DAILY  . MAGNESIUM  Take 250 mg by mouth daily.   . Multiple Vitamin  Take 1 tab daily.  Marland Kitchen NITROLINGUAL 0.4 MG/SPRAY 1 spray under the tongue as needed for chest pain.  Marland Kitchen K-DUR 20 MEQ tablet Take 20 mEqdaily.   Allergies  Allergen Reactions  . Prednisone    PMHx:   Past Medical History:  Diagnosis Date  . Abdominal aortic aneurysm (HCC)    3.3 cm  . Aortic ectasia (HCC)      no abdominal aortic aneurysm.  He is followed for this by   Dr. Curt Cook.  . Bone spur    and fused vertebrae.  He   is treated by Dr. Theda Cook.  Marland Kitchen CAD (coronary artery disease)    Cath March 2012. 95% left main stenosis. Patent LIMA to the LAD, patent saphenous vein graft to OM, patent saphenous vein graft to the right coronary artery.  . Constipated   . COPD (chronic obstructive pulmonary disease) ([place])   . Hernia   .  Hyperlipidemia   . Hypertension   . Hypothyroidism   . Pre-diabetes   . PVD (peripheral vascular disease) (HCC)    (90% left iliac stenosis)  . Skin cancer    recent squamous cell carcinoma removed from right   index finger.  . Vitamin D deficiency    Immunization History  Administered Date(s) Administered  . DT 10/27/2013  . Influenza, High Dose Seasonal PF 02/05/2015, 02/28/2016  . Pneumococcal Conjugate-13 07/28/2014  . Pneumococcal Polysaccharide-23 12/25/2003, 07/25/2008  . Td 12/25/2003   Past Surgical History:  Procedure Laterality Date  . CATARACT EXTRACTION    . CORONARY ARTERY BYPASS GRAFT      2010 Median sternotomy, extracorporeal circulation,   coronary artery bypass graft surgery x3 using a left internal mammary   artery graft to left anterior descending coronary artery, with a    saphenous vein graft to the intermediate coronary  artery, and a   saphenous vein graft to the right coronary artery.  Endoscopic vein   harvesting from the left leg.   Marland Kitchen KNEE SURGERY    . PILONIDAL CYST / SINUS EXCISION    . PNEUMONECTOMY     FHx:    Reviewed / unchanged  SHx:    Reviewed / unchanged  Systems Review:  Constitutional: Denies fever, chills, wt changes, headaches, insomnia, fatigue, night sweats, change in appetite. Eyes: Denies redness, blurred vision, diplopia, discharge, itchy, watery eyes.  ENT: Denies discharge, congestion, post nasal drip, epistaxis, sore throat, earache, hearing loss, dental pain, tinnitus, vertigo, sinus pain, snoring.  CV: Denies chest pain, palpitations, irregular heartbeat, syncope, dyspnea, diaphoresis, orthopnea, PND, claudication or edema. Respiratory: denies cough, dyspnea, DOE, pleurisy, hoarseness, laryngitis, wheezing.  Gastrointestinal: Denies dysphagia, odynophagia, heartburn, reflux, water brash, abdominal pain or cramps, nausea, vomiting, bloating, diarrhea, constipation, hematemesis, melena, hematochezia  or hemorrhoids. Genitourinary: Denies dysuria, frequency, urgency, nocturia, hesitancy, discharge, hematuria or flank pain. Musculoskeletal: Denies arthralgias, myalgias, stiffness, jt. swelling, pain, limping or strain/sprain.  Skin: Denies pruritus, rash, hives, warts, acne, eczema or change in skin lesion(s). Neuro: No weakness, tremor, incoordination, spasms, paresthesia or pain. Psychiatric: Denies confusion, memory loss or sensory loss. Endo: Denies change in weight, skin or hair change.  Heme/Lymph: No excessive bleeding, bruising or enlarged lymph nodes.  Physical Exam  BP 162/98 -> 142/86   P72   T 97.7 F    R 16   Ht 5\' 10"     Wt 246 lb 12.8 oz    BMI 35.41   Appears over nourished and in no distress.  O2 sat's at rest are 90% at rest and quickly plunge to 82-84% after walking 50 feet.   Eyes: PERRLA, EOMs, conjunctiva no swelling or erythema. Sinuses: No  frontal/maxillary tenderness ENT/Mouth: EAC's clear, TM's nl w/o erythema, bulging. Nares clear w/o erythema, swelling, exudates. Oropharynx clear without erythema or exudates. Oral hygiene is good. Tongue normal, non obstructing. Hearing intact.  Neck: Supple. Thyroid nl. Car 2+/2+ without bruits, nodes or JVD. Chest:  Median Sternotomy scar. Increased AP diameter. Respirations nl with BS very distant, clear & equal w/o rales, rhonchi, wheezing or stridor.  Cor: Heart sounds normal w/ regular rate and rhythm without sig. murmurs, gallops, clicks, or rubs. Peripheral pulses normal and equal  without edema.  Abdomen: Soft & bowel sounds normal. Non-tender w/o guarding, rebound, hernias, masses, or organomegaly.  Lymphatics: Unremarkable.  Musculoskeletal: Generalized decrease in Muscle power, tone and bulk from severe deconditioning. .  Skin: Warm, dry without exposed rashes, lesions or ecchymosis  apparent.  Neuro: Cranial nerves intact, reflexes equal bilaterally. Sensory-motor testing grossly intact. Tendon reflexes grossly intact.  Pysch: Alert & oriented x 3.  Insight and judgement nl & appropriate. No ideations.  Assessment and Plan:  1. Essential hypertension  - Continue medication, monitor blood pressure at home.  - Continue DASH diet. Reminder to go to the ER if any CP,  SOB, nausea, dizziness, severe HA, changes vision/speech,  left arm numbness and tingling and jaw pain. - CBC with Differential/Platelet - BASIC METABOLIC PANEL WITH GFR - TSH  2. Hyperlipidemia  - Continue diet/meds, exercise,& lifestyle modifications.  - Continue monitor periodic cholesterol/liver & renal functions  - Hepatic function panel - Lipid panel - TSH  3. Prediabetes  - Continue diet, exercise, lifestyle modifications.  - Monitor appropriate labs.  - Hemoglobin A1c - Insulin, random  4. Vitamin D deficiency  - Continue supplementation. - VITAMIN D 25 Hydroxy   5. Abnormal glucose  -  Hemoglobin A1c - Insulin, random  6. PVD (peripheral vascular disease) with claudication ([place])   7. Hypothyroidism  - TSH  8. Pulmonary emphysema ([place])   9. ASCAD s/p CABG ([place])   10. Medication management  - CBC with Differential/Platelet - BASIC METABOLIC PANEL WITH GFR - Hepatic function panel - Magnesium - TSH - VITAMIN D 25 Hydroxy        Recommended regular exercise, BP monitoring, weight control, and discussed med and SE's. Recommended labs to assess and monitor clinical status. Further disposition pending results of labs. Over 30 minutes of exam, counseling, chart review was performed

## 2016-06-05 ENCOUNTER — Other Ambulatory Visit: Payer: Self-pay | Admitting: Internal Medicine

## 2016-06-05 DIAGNOSIS — E039 Hypothyroidism, unspecified: Secondary | ICD-10-CM

## 2016-06-05 LAB — BASIC METABOLIC PANEL WITH GFR
BUN: 17 mg/dL (ref 7–25)
CO2: 21 mmol/L (ref 20–31)
Calcium: 9.4 mg/dL (ref 8.6–10.3)
Chloride: 106 mmol/L (ref 98–110)
Creat: 1.02 mg/dL (ref 0.70–1.11)
GFR, EST AFRICAN AMERICAN: 75 mL/min (ref >=60)
GFR, EST NON AFRICAN AMERICAN: 65 mL/min (ref >=60)
Glucose, Bld: 59 mg/dL — ABNORMAL LOW (ref 65–99)
POTASSIUM: 4.1 mmol/L (ref 3.5–5.3)
Sodium: 143 mmol/L (ref 135–146)

## 2016-06-05 LAB — LIPID PANEL
CHOLESTEROL: 222 mg/dL — AB (ref <200)
HDL: 64 mg/dL (ref >40)
LDL CALC: 134 mg/dL — AB (ref <100)
TRIGLYCERIDES: 119 mg/dL (ref <150)
Total CHOL/HDL Ratio: 3.5 Ratio (ref <5.0)
VLDL: 24 mg/dL (ref <30)

## 2016-06-05 LAB — INSULIN, RANDOM: INSULIN: 25.5 u[IU]/mL — AB (ref 2.0–19.6)

## 2016-06-05 LAB — HEPATIC FUNCTION PANEL
ALK PHOS: 85 U/L (ref 40–115)
ALT: 13 U/L (ref 9–46)
AST: 15 U/L (ref 10–35)
Albumin: 4.1 g/dL (ref 3.6–5.1)
BILIRUBIN INDIRECT: 0.5 mg/dL (ref 0.2–1.2)
BILIRUBIN TOTAL: 0.6 mg/dL (ref 0.2–1.2)
Bilirubin, Direct: 0.1 mg/dL (ref <=0.2)
Total Protein: 6.9 g/dL (ref 6.1–8.1)

## 2016-06-05 LAB — VITAMIN D 25 HYDROXY (VIT D DEFICIENCY, FRACTURES): Vit D, 25-Hydroxy: 43 ng/mL (ref 30–100)

## 2016-06-05 LAB — MAGNESIUM: Magnesium: 2.1 mg/dL (ref 1.5–2.5)

## 2016-06-09 ENCOUNTER — Encounter: Payer: Self-pay | Admitting: *Deleted

## 2016-07-10 ENCOUNTER — Ambulatory Visit (INDEPENDENT_AMBULATORY_CARE_PROVIDER_SITE_OTHER): Payer: Medicare Other | Admitting: *Deleted

## 2016-07-10 DIAGNOSIS — E039 Hypothyroidism, unspecified: Secondary | ICD-10-CM | POA: Diagnosis not present

## 2016-07-10 LAB — TSH: TSH: 6.71 m[IU]/L — AB (ref 0.40–4.50)

## 2016-07-10 NOTE — Progress Notes (Signed)
Patient presents for 1 month recheck TSH level.  Patient states he is taking Levothyroxine 200 mcg 1 pill QD.

## 2016-07-18 ENCOUNTER — Other Ambulatory Visit: Payer: Self-pay | Admitting: Internal Medicine

## 2016-08-04 DIAGNOSIS — M1711 Unilateral primary osteoarthritis, right knee: Secondary | ICD-10-CM | POA: Diagnosis not present

## 2016-08-15 DIAGNOSIS — M1711 Unilateral primary osteoarthritis, right knee: Secondary | ICD-10-CM | POA: Diagnosis not present

## 2016-08-21 DIAGNOSIS — M1711 Unilateral primary osteoarthritis, right knee: Secondary | ICD-10-CM | POA: Diagnosis not present

## 2016-08-25 DIAGNOSIS — M1711 Unilateral primary osteoarthritis, right knee: Secondary | ICD-10-CM | POA: Diagnosis not present

## 2016-08-29 DIAGNOSIS — M1711 Unilateral primary osteoarthritis, right knee: Secondary | ICD-10-CM | POA: Diagnosis not present

## 2016-09-03 DIAGNOSIS — M1711 Unilateral primary osteoarthritis, right knee: Secondary | ICD-10-CM | POA: Diagnosis not present

## 2016-09-05 DIAGNOSIS — M1711 Unilateral primary osteoarthritis, right knee: Secondary | ICD-10-CM | POA: Diagnosis not present

## 2016-09-15 DIAGNOSIS — M1711 Unilateral primary osteoarthritis, right knee: Secondary | ICD-10-CM | POA: Diagnosis not present

## 2016-09-16 NOTE — Progress Notes (Signed)
MEDICARE ANNUAL WELLNESS VISIT AND FOLLOW UP Assessment:    Essential hypertension - continue medications, DASH diet, exercise and monitor at home. Call if greater than 130/80.  - CBC with Differential/Platelet - BASIC METABOLIC PANEL WITH GFR - Hepatic function panel  Hypothyroidism, unspecified hypothyroidism type Hypothyroidism-check TSH level, continue medications the same, reminded to take on an empty stomach 30-50mins before food.  - TSH  Hyperlipidemia -continue medications, check lipids, decrease fatty foods, increase activity.  - Lipid panel   Prediabetes Discussed general issues about diabetes pathophysiology and management., Educational material distributed., Suggested low cholesterol diet., Encouraged aerobic exercise., Discussed foot care., Reminded to get yearly retinal exam. - Hemoglobin A1c   Medication management - Magnesium  Chronic obstructive pulmonary disease, unspecified COPD, unspecified chronic bronchitis type  continue meds and O2.  Check COPD- mild worsening dyspnea, check CXR, declines CP, O2 good at RA   PVD (peripheral vascular disease) with claudication Control blood pressure, cholesterol, glucose, increase exercise.  Increase walking, feet checks, get on ASA   Atherosclerosis of native coronary artery of native heart with other form of angina pectoris Control blood pressure, cholesterol, glucose, increase exercise.  Continue cardio follow up Refill NTG   Vitamin D deficiency Continue supplement  At high risk for falls Doing PT 2 x a week, going to get knee brace, walking with walker, continue f/u ortho  Encounter for Medicare annual wellness exam  History of lung cancer, RUL (1994)    Future Appointments Date Time Provider [place]  01/05/2017 3:00 PM [name], MD [id number] None     Plan:   During the course of the visit the patient was educated and counseled about appropriate screening and preventive services  including:    Pneumococcal vaccine   Influenza vaccine  Td vaccine  Screening electrocardiogram  Colorectal cancer screening  Diabetes screening  Glaucoma screening  Nutrition counseling .    Subjective:  [name] is a 83 y.o. male who presents for Medicare Annual Wellness Visit and 3 month follow up for HTN, hyperlipidemia, prediabetes, and vitamin D Def.   His blood pressure has been controlled at home, today their BP is BP: 140/80 He does not workout. He denies chest pain but has shortness of breath.  He has ASHD, follows with Dr. Percival Spanish, he has angina will take NTG as needed.  He has COPD and and he is O2 dependent, uses it at night and when he is walking, he is RL lobectomy for primary lung cancer by Dr. Arlyce Dice, ambulating just 30 feet with dyspnea. He is starting to have a hard time remember words, will think of a something and say another word, no focal deficiets, still good with minimental status exam. .  He is not on cholesterol medication and denies myalgias. His cholesterol is not at goal. The cholesterol last visit was:   Lab Results  Component Value Date   CHOL 222 (H) 06/04/2016   HDL 64 06/04/2016   LDLCALC 134 (H) 06/04/2016   TRIG 119 06/04/2016   CHOLHDL 3.5 06/04/2016   He has been working on diet and exercise for prediabetes, and denies polydipsia, polyuria and visual disturbances. Last A1C in the office was:  Lab Results  Component Value Date   HGBA1C 5.3 06/04/2016   Patient is on Vitamin D supplement.   Lab Results  Component Value Date   VD25OH 76 06/04/2016     He is on thyroid medication. His medication was changed last visit.   Lab Results  Component Value Date   TSH 6.71 (H) 07/10/2016  .  Decreased hearing BMI is Body mass index is 35.44 kg/m., he is working on diet and exercise. Wt Readings from Last 3 Encounters:  09/17/16 247 lb (112 kg)  06/04/16 246 lb 12.8 oz (111.9 kg)  02/28/16 243 lb (110.2 kg)   He walks with a  walker, has been seeing Dr. Theda Sers for injection in his right knee  Medication Review: Current Outpatient Prescriptions on File Prior to Visit  Medication Sig Dispense Refill  . Cholecalciferol (VITAMIN D-3) 5000 UNITS TABS Take by mouth daily.     . Coenzyme Q10 400 MG CAPS Take 1 capsule by mouth daily.    . enalapril (VASOTEC) 20 MG tablet TAKE ONE TABLET BY MOUTH ONCE DAILY 90 tablet 3  . fish oil-omega-3 fatty acids 1000 MG capsule Take 1 g by mouth daily.      Marland Kitchen ibuprofen (ADVIL,MOTRIN) 200 MG tablet Take 200 mg by mouth every 6 (six) hours as needed.    Marland Kitchen levothyroxine (SYNTHROID, LEVOTHROID) 200 MCG tablet TAKE ONE TABLET BY MOUTH ONCE DAILY 90 tablet 1  . MAGNESIUM PO Take 250 mg by mouth daily.     . Multiple Vitamin (MULTIVITAMIN) tablet Take 1 tablet by mouth daily.    . potassium chloride SA (K-DUR,KLOR-CON) 20 MEQ tablet Take 20 mEq by mouth daily.    . nitroGLYCERIN (NITROLINGUAL) 0.4 MG/SPRAY spray Place 1 spray under the tongue every 5 (five) minutes as needed for chest pain. 4.9 g 3   No current facility-administered medications on file prior to visit.     Current Problems (verified) Patient Active Problem List   Diagnosis Date Noted  . At high risk for falls 08/15/2015  . Encounter for Medicare annual wellness exam 08/15/2015  . History of lung cancer, RUL (1994)  10/30/2014  . COPD (chronic obstructive pulmonary disease) ([place]) 04/21/2014  . Essential hypertension 10/27/2013  . Abnormal glucose 10/27/2013  . Medication management 10/27/2013  . Vitamin D deficiency 10/27/2013  . Hyperlipidemia 10/05/2009  . ASCAD / CABG (2010)  10/05/2009  . PVD (peripheral vascular disease) with claudication ([place]) 10/05/2009  . Hypothyroidism 10/03/2009    Screening Tests Immunization History  Administered Date(s) Administered  . DT 10/27/2013  . Influenza, High Dose Seasonal PF 02/05/2015, 02/28/2016  . Pneumococcal Conjugate-13 07/28/2014  . Pneumococcal  Polysaccharide-23 12/25/2003, 07/25/2008  . Td 12/25/2003   Preventative care: Last colonoscopy: declines another Echo 03/2014 CXR 04/2014  Prior vaccinations: TD or Tdap: 2015 Influenza: 2017  Pneumococcal: 2010 Prevnar13: 2016 Shingles/Zostavax: declines  Names of Other Physician/Practitioners you currently use: 1. [facility] here for primary care 2. Dr.unknwon , eye doctor, last visit may 2017, has OV in may 3. None, dentist, has dentures 4. Dr. Theda Sers, ortho Patient Care Team: [name], MD as PCP - General (Internal Medicine) Minus Breeding, MD as Consulting Physician (Cardiology) Suella Broad, MD as Consulting Physician (Physical Medicine and Rehabilitation)  History reviewed: allergies, current medications, past family history, past medical history, past social history, past surgical history and problem list  Allergies Allergies  Allergen Reactions  . Prednisone     SURGICAL HISTORY He  has a past surgical history that includes Pneumonectomy; Knee surgery; Cataract extraction; Pilonidal cyst / sinus excision; and Coronary artery bypass graft. FAMILY HISTORY His family history includes Heart disease in his mother; Pneumonia in his father. SOCIAL HISTORY He  reports that he quit smoking about 14 years ago. He  has never used smokeless tobacco. He reports that he does not drink alcohol or use drugs.   MEDICARE WELLNESS OBJECTIVES: Physical activity: Current Exercise Habits: The patient does not participate in regular exercise at present, Exercise limited by: respiratory conditions(s);orthopedic condition(s) Cardiac risk factors: Cardiac Risk Factors include: advanced age (>65men, >55 women);dyslipidemia;family history of premature cardiovascular disease;hypertension;male gender;obesity (BMI >30kg/m2);sedentary lifestyle Depression/mood screen:   Depression screen [facility] 2/9 09/17/2016  Decreased Interest 0  Down, Depressed,  Hopeless 0  PHQ - 2 Score 0    ADLs:  In your present state of health, do you have any difficulty performing the following activities: 09/17/2016 06/04/2016  Hearing? N N  Vision? N N  Difficulty concentrating or making decisions? Y N  Walking or climbing stairs? Y Y  Dressing or bathing? Y N  Doing errands, shopping? N N  Preparing Food and eating ? Y -  Using the Toilet? Y -  In the past six months, have you accidently leaked urine? Y -  Do you have problems with loss of bowel control? N -  Managing your Medications? N -  Managing your Finances? N -  Housekeeping or managing your Housekeeping? Y -  Some recent data might be hidden    Cognitive Testing  Alert? Yes  Normal Appearance?Yes  Oriented to person? Yes  Place? Yes   Time? Yes  Recall of three objects?  Yes  Can perform simple calculations? Yes  Displays appropriate judgment?Yes  Can read the correct time from a watch face?Yes  EOL planning: Does Patient Have a Medical Advance Directive?: Yes Type of Advance Directive: Healthcare Power of Attorney, Living will [place] in Chart?: No - copy requested   Objective:   Blood pressure 140/80, pulse 69, temperature 97.3 F (36.3 C), resp. rate 16, height 5\' 10"  (1.778 m), weight 247 lb (112 kg), SpO2 96 %. Body mass index is 35.44 kg/m.  General appearance: alert, no distress, WD/WN, male General Appearance: Well nourished, in no apparent distress. Eyes: PERRLA, EOMs, conjunctiva no swelling or erythema Sinuses: No Frontal/maxillary tenderness ENT/Mouth: Ext aud canals clear, TMs without erythema, bulging. No erythema, swelling, or exudate on post pharynx.  Tonsils not swollen or erythematous. Hearing decreased  Neck: Supple, thyroid normal.  Respiratory: Respiratory effort normal, decreased BS bilateral bases  without rales, rhonchi, wheezing or stridor. Dsypnea with talking.  Cardio: RRR with systolic murmur. Decreased pulses bilateral,  decreased cap refill bialterally,  with 1+ edema, venous stasis without ulcers. .  Abdomen: Soft, + BS, obese  Non tender, no guarding, rebound, hernias, masses. Lymphatics: Non tender without lymphadenopathy.  Musculoskeletal: Full ROM, 4/5 strength, walks with a walker, antalgic gait.  Skin: +  Ecchymosis on arms Warm, dry without rashes, lesions Neurological: alert, oriented x 3, CN2-12 intact, sensation decreased bilateral legs to 1/3 up shin, DTRs 2+ throughout, no cerebellar signs Psychiatric: normal affect, behavior normal, pleasant   Medicare Attestation I have personally reviewed: The patient's medical and social history Their use of alcohol, tobacco or illicit drugs Their current medications and supplements The patient's functional ability including ADLs,fall risks, home safety risks, cognitive, and hearing and visual impairment Diet and physical activities Evidence for depression or mood disorders  The patient's weight, height, BMI, and visual acuity have been recorded in the chart.  I have made referrals, counseling, and provided education to the patient based on review of the above and I have provided the patient with a written personalized care plan for  preventive services.     Vicie Mutters, PA-C   09/17/2016

## 2016-09-17 ENCOUNTER — Encounter: Payer: Self-pay | Admitting: Physician Assistant

## 2016-09-17 ENCOUNTER — Ambulatory Visit (INDEPENDENT_AMBULATORY_CARE_PROVIDER_SITE_OTHER): Payer: Medicare Other | Admitting: Physician Assistant

## 2016-09-17 ENCOUNTER — Ambulatory Visit: Payer: Self-pay | Admitting: Internal Medicine

## 2016-09-17 VITALS — BP 140/80 | HR 69 | Temp 97.3°F | Resp 16 | Ht 70.0 in | Wt 247.0 lb

## 2016-09-17 DIAGNOSIS — I739 Peripheral vascular disease, unspecified: Secondary | ICD-10-CM | POA: Diagnosis not present

## 2016-09-17 DIAGNOSIS — E782 Mixed hyperlipidemia: Secondary | ICD-10-CM

## 2016-09-17 DIAGNOSIS — R7309 Other abnormal glucose: Secondary | ICD-10-CM | POA: Diagnosis not present

## 2016-09-17 DIAGNOSIS — Z79899 Other long term (current) drug therapy: Secondary | ICD-10-CM | POA: Diagnosis not present

## 2016-09-17 DIAGNOSIS — I10 Essential (primary) hypertension: Secondary | ICD-10-CM

## 2016-09-17 DIAGNOSIS — Z0001 Encounter for general adult medical examination with abnormal findings: Secondary | ICD-10-CM | POA: Diagnosis not present

## 2016-09-17 DIAGNOSIS — Z0000 Encounter for general adult medical examination without abnormal findings: Secondary | ICD-10-CM

## 2016-09-17 DIAGNOSIS — Z9181 History of falling: Secondary | ICD-10-CM | POA: Diagnosis not present

## 2016-09-17 DIAGNOSIS — I25708 Atherosclerosis of coronary artery bypass graft(s), unspecified, with other forms of angina pectoris: Secondary | ICD-10-CM | POA: Diagnosis not present

## 2016-09-17 DIAGNOSIS — J449 Chronic obstructive pulmonary disease, unspecified: Secondary | ICD-10-CM | POA: Diagnosis not present

## 2016-09-17 DIAGNOSIS — Z85118 Personal history of other malignant neoplasm of bronchus and lung: Secondary | ICD-10-CM | POA: Diagnosis not present

## 2016-09-17 DIAGNOSIS — E559 Vitamin D deficiency, unspecified: Secondary | ICD-10-CM | POA: Diagnosis not present

## 2016-09-17 DIAGNOSIS — R6889 Other general symptoms and signs: Secondary | ICD-10-CM | POA: Diagnosis not present

## 2016-09-17 DIAGNOSIS — E039 Hypothyroidism, unspecified: Secondary | ICD-10-CM | POA: Diagnosis not present

## 2016-09-17 LAB — BASIC METABOLIC PANEL WITH GFR
BUN: 14 mg/dL (ref 7–25)
CO2: 24 mmol/L (ref 20–31)
CREATININE: 1.03 mg/dL (ref 0.70–1.11)
Calcium: 9.1 mg/dL (ref 8.6–10.3)
Chloride: 106 mmol/L (ref 98–110)
GFR, Est African American: 74 mL/min (ref >=60)
GFR, Est Non African American: 64 mL/min (ref >=60)
Glucose, Bld: 89 mg/dL (ref 65–99)
Potassium: 4.2 mmol/L (ref 3.5–5.3)
Sodium: 141 mmol/L (ref 135–146)

## 2016-09-17 LAB — CBC WITH DIFFERENTIAL/PLATELET
BASOS ABS: 0 {cells}/uL (ref 0–200)
Basophils Relative: 0 %
EOS ABS: 166 {cells}/uL (ref 15–500)
Eosinophils Relative: 2 %
HEMATOCRIT: 40.4 % (ref 38.5–50.0)
HEMOGLOBIN: 13.2 g/dL (ref 13.2–17.1)
LYMPHS ABS: 3237 {cells}/uL (ref 850–3900)
Lymphocytes Relative: 39 %
MCH: 28.7 pg (ref 27.0–33.0)
MCHC: 32.7 g/dL (ref 32.0–36.0)
MCV: 87.8 fL (ref 80.0–100.0)
MONO ABS: 747 {cells}/uL (ref 200–950)
MONOS PCT: 9 %
MPV: 9.3 fL (ref 7.5–12.5)
NEUTROS ABS: 4150 {cells}/uL (ref 1500–7800)
Neutrophils Relative %: 50 %
Platelets: 210 10*3/uL (ref 140–400)
RBC: 4.6 MIL/uL (ref 4.20–5.80)
RDW: 14.3 % (ref 11.0–15.0)
WBC: 8.3 10*3/uL (ref 3.8–10.8)

## 2016-09-17 LAB — LIPID PANEL
CHOL/HDL RATIO: 4.7 ratio (ref <5.0)
Cholesterol: 205 mg/dL — ABNORMAL HIGH (ref <200)
HDL: 44 mg/dL (ref >40)
LDL CALC: 107 mg/dL — AB (ref <100)
Triglycerides: 270 mg/dL — ABNORMAL HIGH (ref <150)
VLDL: 54 mg/dL — ABNORMAL HIGH (ref <30)

## 2016-09-17 LAB — HEPATIC FUNCTION PANEL
ALBUMIN: 3.9 g/dL (ref 3.6–5.1)
ALK PHOS: 77 U/L (ref 40–115)
ALT: 11 U/L (ref 9–46)
AST: 14 U/L (ref 10–35)
Bilirubin, Direct: 0.1 mg/dL (ref <=0.2)
Indirect Bilirubin: 0.3 mg/dL (ref 0.2–1.2)
Total Bilirubin: 0.4 mg/dL (ref 0.2–1.2)
Total Protein: 6.3 g/dL (ref 6.1–8.1)

## 2016-09-17 LAB — TSH: TSH: 3.87 m[IU]/L (ref 0.40–4.50)

## 2016-09-17 LAB — MAGNESIUM: MAGNESIUM: 2 mg/dL (ref 1.5–2.5)

## 2016-09-17 NOTE — Patient Instructions (Signed)
Nitroglycerin sublingual tablets  You can take this medication if you have chest pain.  It relaxes blood vessels, increasing the blood and oxygen supply to your heart.  At the first sign of chest pain or tightness place one tablet under your tongue.  Please be sitting down when taking this medication because it can cause dizziness and low blood pressure.   Follow the directions on the prescription label.  -Let the tablet dissolve under the tongue. Do not swallow whole.  -Replace the dose if you accidentally swallow it. It will help if your mouth is not dry.  -Do not eat or drink, smoke or chew tobacco while a tablet is dissolving.   *If you are not better within 5 minutes after taking ONE dose of nitroglycerin, call 9-1-1 immediately to seek emergency medical care*.  *Do not take more than 3 nitroglycerin tablets over 15 minutes*.  If you take this medicine often to relieve symptoms of angina, your doctor or health care professional may provide you with different instructions to manage your symptoms.  If symptoms do not go away after following these instructions, it is important to call 9-1-1 immediately. Do not take more than 3 nitroglycerin tablets over 15 minutes.  If you have taken a medication for erectile dysfunction like viagra DO NOT take this medication, if you are having chest pain call 911 and go to the ER.  The tablets are only good for 3 months once the container has been opened.  If the container has never been opened, then they are good for 3 years.  One way to tell if they are good is to see if you notice a sting when placed under the tongue (ONLY TEST WHEN NEEDED).  If there is NOT a sting, then they are no longer good.  If you have any doubt, then please request a refill or call 911 if you are having chest pain.   Vascular Dementia Dementia is a condition in which a person has problems with thinking, memory, and behavior that are severe enough to interfere with daily life.  Vascular dementia is a type of dementia. It results from brain damage that is caused by the brain not getting enough blood. Vascular dementia usually begins between 52 and 66 years of age. What are the causes? Vascular dementia is caused by conditions that lessen blood flow to the brain. Common causes include:  Multiple small strokes. These may happen without symptoms (silent stroke).  Major stroke.  Damage to small blood vessels in the brain (cerebral small vessel disease). What increases the risk?  Advancing age.  Having had a stroke.  Having high blood pressure (hypertension) or high cholesterol.  Having a disease that affects the heart or blood vessels.  Smoking.  Having diabetes.  Being male.  Being obese.  Not being active.  Having depression. What are the signs or symptoms? Symptoms can vary a lot from one person to another. Symptoms may be mild or severe depending on the amount of damage and which parts of the brain have been affected. Symptoms may begin suddenly or may develop gradually. Symptoms may remain stable, or they may get worse over time. Symptoms of vascular dementia may be similar to those of Alzheimer disease. The two conditions can occur together (mixed dementia). Symptoms of vascular dementia may include: Mental   Confusion.  Memory problems.  Poor attention and concentration.  Trouble understanding speech.  Depression.  Personality changes.  Trouble recognizing familiar people.  Agitation or aggression.  Paranoia.  Delusions or hallucinations. Physical   Weakness.  Poor balance.  Loss of bladder or bowel control (incontinence).  Unsteady walking (gait).  Speaking problems. Behavioral   Getting lost in familiar places.  Problems with planning and judgment.  Trouble following instructions.  Social problems.  Emotional outbursts.  Trouble with daily activities and self-care.  Problems handling money. How is this  diagnosed? There is not a specific test to diagnose vascular dementia. The health care provider will consider the person's medical history and symptoms or changes that are reported by friends and family. The health care provider will do a physical exam and may order lab tests or other tests that check brain and nervous system function. Tests that may be done include:  Blood tests.  Brain imaging tests.  Tests of movement, speech, and other daily activities (neurological exam).  Tests of memory, thinking, and problem-solving (neuropsychological or neurocognitive testing). Diagnosis may involve several specialists. These may include a health care provider who specializes in the brain and nervous system (neurologist), a provider who specializes in disorders of the mind (psychiatrist), and a provider who focuses on speech and language changes (Electrical engineer). How is this treated? There is no cure for vascular dementia. Brain damage that has already occurred cannot be reversed. Treatment depends on:  How severe the condition is.  Which parts of the brain have been affected.  The person's overall health. Treatment measures aim to:  Treat the underlying cause of vascular dementia and manage risk factors. This may include:  Controlling blood pressure.  Lowering cholesterol.  Treating diabetes.  Quitting smoking.  Losing weight.  Manage symptoms.  Prevent further brain damage.  Improve the person's health and quality of life. Treatment for dementia may involve a team of health care providers, including:  A neurologist.  A psychiatrist.  An occupational therapist.  A speech pathologist.  A cardiologist.  An exercise physiologist or physical therapist. Follow these instructions at home: Home care for a person with vascular dementia depends on what caused the condition and how severe the symptoms are. General guidelines for care at home include:  Following the health  care provider's instructions for treating the condition that caused the dementia.  Using medicines only as told by the person's health care provider.  Creating a safe living space.  Learning ways to help the person remember people, appointments, and daily activities.  Finding a support group to help caregivers and family to cope with the effects of dementia.  Helping family and friends learn about ways to communicate with a person who has dementia.  Making sure the person keeps all follow-up visits and goes to all rehabilitation appointments as told by the health care team. This is important. Contact a health care provider if:  A fever develops.  New behavioral problems develop.  Problems with swallowing develop.  Confusion gets worse.  Sleepiness gets worse. Get help right away if:  Loss of consciousness occurs.  There is a sudden loss of speech, balance, or thinking ability.  New numbness or paralysis occurs.  Sudden, severe headache occurs.  Vision is lost or suddenly gets worse in one or both eyes. This information is not intended to replace advice given to you by your health care provider. Make sure you discuss any questions you have with your health care provider. Document Released: 05/02/2002 Document Revised: 10/18/2015 Document Reviewed: 08/23/2014 Elsevier Interactive Patient Education  2017 [employer]  1. Use "WARM" strategy.  W=  write it down  A= associate it  R= repeat it  M= make a mental note  2.   You can keep a Social worker.  Use a 3-ring notebook with sections for the following: calendar, important names and phone numbers,  medications, doctors' names/phone numbers, lists/reminders, and a section to journal what you did  each day.   3.    Use a calendar to write appointments down.  4.    Write yourself a schedule for the day.  This can be placed on the calendar or in a separate section of the Memory  Notebook.  Keeping a  regular schedule can help memory.  5.    Use medication organizer with sections for each day or morning/evening pills.  You may need help loading it  6.    Keep a basket, or pegboard by the door.  Place items that you need to take out with you in the basket or on the pegboard.  You may also want to  include a message board for reminders.  7.    Use sticky notes.  Place sticky notes with reminders in a place where the task is performed.  For example: " turn off the  stove" placed by the stove, "lock the door" placed on the door at eye level, " take your medications" on  the bathroom mirror or by the place where you normally take your medications.  8.    Use alarms/timers.  Use while cooking to remind yourself to check on food or as a reminder to take your medicine, or as a  reminder to make a call, or as a reminder to perform another task, etc.

## 2016-09-18 LAB — HEMOGLOBIN A1C
Hgb A1c MFr Bld: 5.2 % (ref <5.7)
MEAN PLASMA GLUCOSE: 103 mg/dL

## 2016-09-18 NOTE — Progress Notes (Signed)
LVM for pt to return office call for LAB results.

## 2016-09-25 DIAGNOSIS — H5452A1 Low vision left eye category 1, normal vision right eye: Secondary | ICD-10-CM | POA: Diagnosis not present

## 2016-09-25 DIAGNOSIS — Z961 Presence of intraocular lens: Secondary | ICD-10-CM | POA: Diagnosis not present

## 2016-09-25 DIAGNOSIS — H524 Presbyopia: Secondary | ICD-10-CM | POA: Diagnosis not present

## 2016-09-25 DIAGNOSIS — H35342 Macular cyst, hole, or pseudohole, left eye: Secondary | ICD-10-CM | POA: Diagnosis not present

## 2016-10-02 NOTE — Progress Notes (Signed)
Pt aware of lab results & voiced understanding.

## 2016-10-21 ENCOUNTER — Telehealth: Payer: Self-pay | Admitting: Internal Medicine

## 2016-10-21 ENCOUNTER — Encounter: Payer: Self-pay | Admitting: Physician Assistant

## 2016-10-21 DIAGNOSIS — L603 Nail dystrophy: Secondary | ICD-10-CM | POA: Insufficient documentation

## 2016-10-21 NOTE — Telephone Encounter (Signed)
patient called to request podiatrist referral for overgrown toenails. Please advise your recommendation with dx.

## 2016-12-17 DIAGNOSIS — M19011 Primary osteoarthritis, right shoulder: Secondary | ICD-10-CM | POA: Diagnosis not present

## 2016-12-17 DIAGNOSIS — M1711 Unilateral primary osteoarthritis, right knee: Secondary | ICD-10-CM | POA: Diagnosis not present

## 2017-01-05 ENCOUNTER — Ambulatory Visit (INDEPENDENT_AMBULATORY_CARE_PROVIDER_SITE_OTHER): Payer: Medicare Other | Admitting: Internal Medicine

## 2017-01-05 ENCOUNTER — Encounter: Payer: Self-pay | Admitting: Internal Medicine

## 2017-01-05 VITALS — BP 162/80 | HR 64 | Temp 97.8°F | Resp 24 | Ht 70.0 in | Wt 245.8 lb

## 2017-01-05 DIAGNOSIS — Z1212 Encounter for screening for malignant neoplasm of rectum: Secondary | ICD-10-CM

## 2017-01-05 DIAGNOSIS — E559 Vitamin D deficiency, unspecified: Secondary | ICD-10-CM

## 2017-01-05 DIAGNOSIS — N320 Bladder-neck obstruction: Secondary | ICD-10-CM

## 2017-01-05 DIAGNOSIS — R7303 Prediabetes: Secondary | ICD-10-CM

## 2017-01-05 DIAGNOSIS — Z1211 Encounter for screening for malignant neoplasm of colon: Secondary | ICD-10-CM | POA: Diagnosis not present

## 2017-01-05 DIAGNOSIS — J449 Chronic obstructive pulmonary disease, unspecified: Secondary | ICD-10-CM | POA: Diagnosis not present

## 2017-01-05 DIAGNOSIS — Z79899 Other long term (current) drug therapy: Secondary | ICD-10-CM

## 2017-01-05 DIAGNOSIS — E782 Mixed hyperlipidemia: Secondary | ICD-10-CM | POA: Diagnosis not present

## 2017-01-05 DIAGNOSIS — Z125 Encounter for screening for malignant neoplasm of prostate: Secondary | ICD-10-CM

## 2017-01-05 DIAGNOSIS — I10 Essential (primary) hypertension: Secondary | ICD-10-CM

## 2017-01-05 DIAGNOSIS — E039 Hypothyroidism, unspecified: Secondary | ICD-10-CM

## 2017-01-05 DIAGNOSIS — I25708 Atherosclerosis of coronary artery bypass graft(s), unspecified, with other forms of angina pectoris: Secondary | ICD-10-CM

## 2017-01-05 DIAGNOSIS — R7309 Other abnormal glucose: Secondary | ICD-10-CM

## 2017-01-05 DIAGNOSIS — Z136 Encounter for screening for cardiovascular disorders: Secondary | ICD-10-CM

## 2017-01-05 LAB — CBC WITH DIFFERENTIAL/PLATELET
BASOS ABS: 0 {cells}/uL (ref 0–200)
Basophils Relative: 0 %
EOS PCT: 3 %
Eosinophils Absolute: 216 cells/uL (ref 15–500)
HCT: 37.8 % — ABNORMAL LOW (ref 38.5–50.0)
Hemoglobin: 12.4 g/dL — ABNORMAL LOW (ref 13.2–17.1)
LYMPHS ABS: 2592 {cells}/uL (ref 850–3900)
LYMPHS PCT: 36 %
MCH: 29.1 pg (ref 27.0–33.0)
MCHC: 32.8 g/dL (ref 32.0–36.0)
MCV: 88.7 fL (ref 80.0–100.0)
MONOS PCT: 9 %
MPV: 8.5 fL (ref 7.5–12.5)
Monocytes Absolute: 648 cells/uL (ref 200–950)
NEUTROS PCT: 52 %
Neutro Abs: 3744 cells/uL (ref 1500–7800)
PLATELETS: 206 10*3/uL (ref 140–400)
RBC: 4.26 MIL/uL (ref 4.20–5.80)
RDW: 15.1 % — AB (ref 11.0–15.0)
WBC: 7.2 10*3/uL (ref 3.8–10.8)

## 2017-01-05 NOTE — Progress Notes (Signed)
[facility]   [name], M.D.      [name]. [name], P.A.-C [facility]                [address] [place], [place] [address] Telephone [phone] Telefax [phone] Comprehensive Evaluation & Examination     This very nice 83 y.o. MWM presents for a  comprehensive evaluation and management of multiple medical co-morbidities.  Patient has been followed for HTN, ASHD/CABG, COPD, Morbid Obesity, Prediabetes, Hyperlipidemia and Vitamin D Deficiency.     HTN predates since 2005. Patient's BP has been controlled at home.  Today's BP is elevated at 162/80 and rechecked at 142 /86. In 2010, he underwent CABG and in 2012, heart cath found patent grafts. Patient denies any cardiac symptoms as chest pain, palpitations, shortness of breath, dizziness or ankle swelling. Patient has O2 dependent COPD and becomes dyspneic with virtually any activity.  He had a RLLobectomy for primary Lung Cancer by Dr Arlyce Dice in 2004 w/o known recurrance.      Patient's hyperlipidemia is controlled with diet and medications. Patient denies myalgias or other medication SE's. Last lipids were not at goal with elevated LDL 107 and Trig's 270: Lab Results  Component Value Date   CHOL 205 (H) 09/17/2016   HDL 44 09/17/2016   LDLCALC 107 (H) 09/17/2016   TRIG 270 (H) 09/17/2016   CHOLHDL 4.7 09/17/2016      Patient has Morbid Obesity (BMI 35+) and prediabetes (A1c 5.95 in 2012) and patient denies reactive hypoglycemic symptoms, visual blurring, diabetic polys or paresthesias. Last A1c was at goal:  Lab Results  Component Value Date   HGBA1C 5.2 09/17/2016       Finally, patient has history of Vitamin D Deficiency ("28" in 2008) and last vitamin D was still low (goal 70-100): Lab Results  Component Value Date   VD25OH 43 06/04/2016   Current Outpatient Prescriptions on File Prior to Visit  Medication Sig  . Cholecalciferol  (VITAMIN D-3) 5000 UNITS TABS Take by mouth daily.   . Coenzyme Q10 400 MG CAPS Take 1 capsule by mouth daily.  . enalapril (VASOTEC) 20 MG tablet TAKE ONE TABLET BY MOUTH ONCE DAILY  . fish oil-omega-3 fatty acids 1000 MG capsule Take 1 g by mouth daily.    Marland Kitchen ibuprofen (ADVIL,MOTRIN) 200 MG tablet Take 200 mg by mouth every 6 (six) hours as needed.  Marland Kitchen levothyroxine (SYNTHROID, LEVOTHROID) 200 MCG tablet TAKE ONE TABLET BY MOUTH ONCE DAILY  . MAGNESIUM PO Take 250 mg by mouth daily.   . Multiple Vitamin (MULTIVITAMIN) tablet Take 1 tablet by mouth daily.  . potassium chloride SA (K-DUR,KLOR-CON) 20 MEQ tablet Take 20 mEq by mouth daily.  . nitroGLYCERIN (NITROLINGUAL) 0.4 MG/SPRAY spray Place 1 spray under the tongue every 5 (five) minutes as needed for chest pain.   No current facility-administered medications on file prior to visit.    Allergies  Allergen Reactions  . Prednisone    Past Medical History:  Diagnosis Date  . Abdominal aortic aneurysm (HCC)    3.3 cm  . Aortic ectasia (HCC)      no abdominal aortic aneurysm.  He is followed for this by   Dr. Curt Jews.  . Bone spur    and fused vertebrae.  He   is treated by Dr. Theda Sers.  Marland Kitchen CAD (coronary artery disease)  Cath March 2012. 95% left main stenosis. Patent LIMA to the LAD, patent saphenous vein graft to OM, patent saphenous vein graft to the right coronary artery.  . Constipated   . COPD (chronic obstructive pulmonary disease) ([place])   . Hernia   . Hyperlipidemia   . Hypertension   . Hypothyroidism   . Pre-diabetes   . PVD (peripheral vascular disease) (HCC)    (90% left iliac stenosis)  . Skin cancer    recent squamous cell carcinoma removed from right   index finger.  . Vitamin D deficiency    Health Maintenance  Topic Date Due  . INFLUENZA VACCINE  12/24/2016  . TETANUS/TDAP  11/11/2023  . PNA vac Low Risk Adult  Completed   Immunization History  Administered Date(s) Administered  . DT 10/27/2013  .  Influenza, High Dose Seasonal PF 02/05/2015, 02/28/2016  . Pneumococcal Conjugate-13 07/28/2014  . Pneumococcal Polysaccharide-23 12/25/2003, 07/25/2008  . Td 12/25/2003   Past Surgical History:  Procedure Laterality Date  . CATARACT EXTRACTION    . CORONARY ARTERY BYPASS GRAFT      2010 Median sternotomy, extracorporeal circulation,   coronary artery bypass graft surgery x3 using a left internal mammary   artery graft to left anterior descending coronary artery, with a    saphenous vein graft to the intermediate coronary artery, and a   saphenous vein graft to the right coronary artery.  Endoscopic vein   harvesting from the left leg.   Marland Kitchen KNEE SURGERY    . PILONIDAL CYST / SINUS EXCISION    . PNEUMONECTOMY     Family History  Problem Relation Age of Onset  . Heart disease Mother   . Pneumonia Father    Social History   Social History  . Marital status: Married    Spouse name: N/A  . Number of children: N/A  . Years of education: N/A   Occupational History  . Not on file.   Social History Main Topics  . Smoking status: Former Smoker    Quit date: 06/23/2002  . Smokeless tobacco: Never Used  . Alcohol use No  . Drug use: No  . Sexual activity: Not on file   Other Topics Concern  . Not on file   Social History Narrative   [name] has been married for the last 69 years.  He has one    daughter.  He is a retired Corporate treasurer.  He quit smoking    cigarettes July 2004.  Previous to this, he did    smoke a pack a day for 60 years.  Did not drink alcohol.  He lives with his wife in a multilevel dwelling.  He drives.   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    ROS Constitutional: Denies fever, chills, weight loss/gain, headaches, insomnia,  night sweats or change in appetite. Does c/o fatigue. Eyes: Denies redness, blurred vision, diplopia, discharge, itchy or watery eyes.  ENT: Denies discharge, congestion, post nasal drip, epistaxis, sore throat, earache,  hearing loss, dental pain, Tinnitus, Vertigo, Sinus pain or snoring.  Cardio: Denies chest pain, palpitations, irregular heartbeat, syncope, dyspnea, diaphoresis, orthopnea, PND, claudication or edema Respiratory: denies cough, dyspnea, DOE, pleurisy, hoarseness, laryngitis or wheezing.  Gastrointestinal: Denies dysphagia, heartburn, reflux, water brash, pain, cramps, nausea, vomiting, bloating, diarrhea, constipation, hematemesis, melena, hematochezia, jaundice or hemorrhoids Genitourinary: Denies dysuria, frequency, urgency, nocturia, hesitancy, discharge, hematuria or flank pain Musculoskeletal: Denies arthralgia, myalgia, stiffness, Jt. Swelling, pain, limp or strain/sprain.  Denies Falls. Skin: Denies puritis, rash, hives, warts, acne, eczema or change in skin lesion Neuro: No weakness, tremor, incoordination, spasms, paresthesia or pain Psychiatric: Denies confusion, memory loss or sensory loss. Denies Depression. Endocrine: Denies change in weight, skin, hair change, nocturia, and paresthesia, diabetic polys, visual blurring or hyper / hypo glycemic episodes.  Heme/Lymph: No excessive bleeding, bruising or enlarged lymph nodes.  Physical Exam  BP 162/80 -> reck 142/86   Pulse 64   Temp 97.8 F (36.6 C)   Resp (!) 24   Ht 5\' 10"  (1.778 m)   Wt 245 lb 12.8 oz (111.5 kg)   BMI 35.27 kg/m   O2 sat 87% on Rm Air.  General Appearance: Over nourished, elderly and appears chronically ill, but in no apparent distress.  Eyes: PERRLA, EOMs, conjunctiva no swelling or erythema, normal fundi and vessels. Sinuses: No frontal/maxillary tenderness ENT/Mouth: EACs patent / TMs  nl. Nares clear without erythema, swelling, mucoid exudates. Oral hygiene is good. No erythema, swelling, or exudate. Tongue normal, non-obstructing. Tonsils not swollen or erythematous. Hearing normal.  Neck: Supple, thyroid normal. No bruits, nodes or JVD. Respiratory: Respiratory effort normal.  BS very distant without  rales, rhonci, wheezing or stridor. Cardio: Heart sounds are soft with regular rate and rhythm and no murmurs. Peripheral pulses are normal and equal bilaterally with 1-2 (+) ankle & pretibial edema. No aortic or femoral bruits. Chest: Kyphotic with increased AP diameter.   Abdomen: Soft, rotund with Nl bowel sounds. Nontender, no guarding, rebound, hernias, masses, or organomegaly.  Lymphatics: Non tender without lymphadenopathy.  Genitourinary:  DRE - deferred to age Musculoskeletal: Generalized decrease in muscle power, tone & bulk. Broad based gait supported by a rolling walker. Skin: Warm and dry without rashes, lesions, cyanosis, clubbing or  ecchymosis.  Neuro: Cranial nerves intact, reflexes equal bilaterally. Normal muscle tone, no cerebellar symptoms. Sensation intact.  Pysch: Alert and oriented X 3 with normal affect, insight and judgment appropriate.   Assessment and Plan  1. Essential hypertension  - EKG 12-Lead - [place], RETROPERITNL ABD,  LTD - Urinalysis, Routine w reflex microscopic - Microalbumin / creatinine urine ratio - CBC with Differential/Platelet - BASIC METABOLIC PANEL WITH GFR - Magnesium - TSH  2. Hyperlipidemia, mixed  - EKG 12-Lead - [place], RETROPERITNL ABD,  LTD - Hepatic function panel - Lipid panel - TSH  3. Prediabetes  - EKG 12-Lead - [place], RETROPERITNL ABD,  LTD - Hemoglobin A1c - Insulin, random  4. Vitamin D deficiency  - VITAMIN D 25 Hydrox  5. Abnormal glucose  - Hemoglobin A1c - Insulin, random  6. Atherosclerosis of coronary artery bypass graft of native heart with other forms of angina pectoris ([place])  - EKG 12-Lead y   7. Chronic obstructive pulmonary disease ([place])   8. Hypothyroidism, unspecified type  - TSH  9. Prostate cancer screening  - PSA  10. Screening for rectal cancer  - POC Hemoccult Bld/Stl   11. Bladder neck obstruction  - PSA  12. Screening for ischemic heart disease  - EKG 12-Lead  13.  Screening for AAA (aortic abdominal aneurysm)  - [place], RETROPERITNL ABD,  LTD  14. Medication management  - Urinalysis, Routine w reflex microscopic - Microalbumin / creatinine urine ratio - CBC with Differential/Platelet - BASIC METABOLIC PANEL WITH GFR - Magnesium - Lipid panel - TSH - Hemoglobin A1c - Insulin, random - VITAMIN D 25 Hydroxy       Patient was counseled in prudent diet, weight control  to achieve/maintain BMI less than 25, BP monitoring, regular exercise and medications as discussed.  Discussed med effects and SE's. Routine screening labs and tests as requested with regular follow-up as recommended. Over 40 minutes of exam, counseling, chart review and high complex critical decision making was performed

## 2017-01-05 NOTE — Patient Instructions (Signed)

## 2017-01-06 LAB — LIPID PANEL
CHOL/HDL RATIO: 4.6 ratio (ref <5.0)
Cholesterol: 206 mg/dL — ABNORMAL HIGH (ref <200)
HDL: 45 mg/dL (ref >40)
LDL CALC: 122 mg/dL — AB (ref <100)
Triglycerides: 193 mg/dL — ABNORMAL HIGH (ref <150)
VLDL: 39 mg/dL — AB (ref <30)

## 2017-01-06 LAB — URINALYSIS, ROUTINE W REFLEX MICROSCOPIC
BILIRUBIN URINE: NEGATIVE
Glucose, UA: NEGATIVE
Hgb urine dipstick: NEGATIVE
Ketones, ur: NEGATIVE
LEUKOCYTES UA: NEGATIVE
Nitrite: NEGATIVE
Protein, ur: NEGATIVE
SPECIFIC GRAVITY, URINE: 1.025 (ref 1.001–1.035)
pH: 5.5 (ref 5.0–8.0)

## 2017-01-06 LAB — BASIC METABOLIC PANEL WITH GFR
BUN: 14 mg/dL (ref 7–25)
CALCIUM: 9.1 mg/dL (ref 8.6–10.3)
CO2: 22 mmol/L (ref 20–32)
CREATININE: 0.92 mg/dL (ref 0.70–1.11)
Chloride: 105 mmol/L (ref 98–110)
GFR, EST AFRICAN AMERICAN: 84 mL/min (ref >=60)
GFR, EST NON AFRICAN AMERICAN: 73 mL/min (ref >=60)
Glucose, Bld: 95 mg/dL (ref 65–99)
Potassium: 4.2 mmol/L (ref 3.5–5.3)
SODIUM: 140 mmol/L (ref 135–146)

## 2017-01-06 LAB — MAGNESIUM: MAGNESIUM: 2.1 mg/dL (ref 1.5–2.5)

## 2017-01-06 LAB — HEPATIC FUNCTION PANEL
ALT: 10 U/L (ref 9–46)
AST: 12 U/L (ref 10–35)
Albumin: 3.8 g/dL (ref 3.6–5.1)
Alkaline Phosphatase: 86 U/L (ref 40–115)
BILIRUBIN DIRECT: 0.1 mg/dL (ref <=0.2)
BILIRUBIN INDIRECT: 0.5 mg/dL (ref 0.2–1.2)
Total Bilirubin: 0.6 mg/dL (ref 0.2–1.2)
Total Protein: 6.2 g/dL (ref 6.1–8.1)

## 2017-01-06 LAB — MICROALBUMIN / CREATININE URINE RATIO
CREATININE, URINE: 186 mg/dL (ref 20–370)
MICROALB UR: 0.7 mg/dL
MICROALB/CREAT RATIO: 4 ug/mg{creat} (ref <30)

## 2017-01-06 LAB — VITAMIN D 25 HYDROXY (VIT D DEFICIENCY, FRACTURES): VIT D 25 HYDROXY: 40 ng/mL (ref 30–100)

## 2017-01-06 LAB — HEMOGLOBIN A1C
HEMOGLOBIN A1C: 5.3 % (ref <5.7)
Mean Plasma Glucose: 105 mg/dL

## 2017-01-06 LAB — PSA: PSA: 0.7 ng/mL (ref <=4.0)

## 2017-01-06 LAB — TSH: TSH: 23.04 m[IU]/L — AB (ref 0.40–4.50)

## 2017-01-06 LAB — INSULIN, RANDOM: Insulin: 9.6 u[IU]/mL (ref 2.0–19.6)

## 2017-01-21 ENCOUNTER — Other Ambulatory Visit: Payer: Self-pay

## 2017-01-21 DIAGNOSIS — M19011 Primary osteoarthritis, right shoulder: Secondary | ICD-10-CM | POA: Diagnosis not present

## 2017-01-21 DIAGNOSIS — Z1212 Encounter for screening for malignant neoplasm of rectum: Secondary | ICD-10-CM

## 2017-01-21 LAB — POC HEMOCCULT BLD/STL (HOME/3-CARD/SCREEN)
Card #3 Fecal Occult Blood, POC: NEGATIVE
FECAL OCCULT BLD: NEGATIVE
Fecal Occult Blood, POC: NEGATIVE

## 2017-02-11 ENCOUNTER — Ambulatory Visit: Payer: Medicare Other | Admitting: *Deleted

## 2017-02-11 DIAGNOSIS — E039 Hypothyroidism, unspecified: Secondary | ICD-10-CM | POA: Diagnosis not present

## 2017-02-11 LAB — TSH: TSH: 1.4 mIU/L (ref 0.40–4.50)

## 2017-02-11 NOTE — Progress Notes (Signed)
Patient here for a TSH. Patient was advised to take Levothyroxine 200 mcg 1 tablet 5 days a week and 1.5 tablets on Mondays and Thursdays, per his last lab result.  The patient reports he is taking 200 mcg every day

## 2017-03-16 DIAGNOSIS — M25511 Pain in right shoulder: Secondary | ICD-10-CM | POA: Diagnosis not present

## 2017-03-16 DIAGNOSIS — M1711 Unilateral primary osteoarthritis, right knee: Secondary | ICD-10-CM | POA: Diagnosis not present

## 2017-04-14 DIAGNOSIS — E669 Obesity, unspecified: Secondary | ICD-10-CM | POA: Insufficient documentation

## 2017-04-14 NOTE — Progress Notes (Signed)
FOLLOW UP  Assessment and Plan:   Hypertension Fair control in office today with recheck; reports excellent values from home  Monitor blood pressure at home; patient to call if consistently greater than 140/90 Continue DASH diet.   Reminder to go to the ER if any CP, SOB, nausea, dizziness, severe HA, changes vision/speech, left arm numbness and tingling and jaw pain.  PVD with claudication Control blood pressure, cholesterol, glucose, increase exercise. Continue ASA daily Increase walking as tolerated, feet checks  Cholesterol Not on statin secondary to age and limited benefit; continue fish oil supplement Continue low cholesterol diet and exercise.  Check lipid panel.   History of elevated glucose Recently well controlled with normalized A1Cs Continue diet and exercise.  Perform daily foot/skin check, notify office of any concerning changes.  Check A1C  Vitamin D Def/ osteoporosis prevention Continue supplementation Check Vit D level  Arthritis of multiple joints Continue to follow up with ortho for management; prescribed voltaren gel today for recent increase of pain in hand joints.   Continue diet and meds as discussed. Further disposition pending results of labs. Discussed med's effects and SE's.   Over 30 minutes of exam, counseling, chart review, and critical decision making was performed.   Future Appointments  Date Time Provider [place]  07/20/2017 10:30 AM [name], MD [facility] None  02/03/2018  3:00 PM [name], MD [facility] None    ----------------------------------------------------------------------------------------------------------------------  HPI 83 y.o. male  presents for 3 month follow up on hypertension, PVD (denies recent claudication symptoms), cholesterol, history of abnormal glucose, obesity and vitamin D deficiency. He does take ASA for orthopedic pain. He follows with ortho for joint injections for multi joint  osteoarthritis - reports increased pain in hand joints recently.    BMI is Body mass index is 35.07 kg/m.  Wt Readings from Last 3 Encounters:  04/15/17 244 lb 6.4 oz (110.9 kg)  01/05/17 245 lb 12.8 oz (111.5 kg)  09/17/16 247 lb (112 kg)   His blood pressure has been controlled at home (typically runs 125-135/ 70s), today their BP is BP: (!) 142/76  He does not workout. He denies chest pain, shortness of breath (consistent with baseline - uses 2L O 2 as needed - s/p R side pneumonectomy) , dizziness.   He is not on cholesterol medication secondary to age - on fish oil. His cholesterol is not at goal. The cholesterol last visit was:   Lab Results  Component Value Date   CHOL 206 (H) 01/05/2017   HDL 45 01/05/2017   LDLCALC 122 (H) 01/05/2017   TRIG 193 (H) 01/05/2017   CHOLHDL 4.6 01/05/2017    He has not been working on diet and exercise for history of elevated glucose, and denies nausea, paresthesia of the feet, polydipsia, polyuria, visual disturbances and vomiting. Last A1C in the office was:  Lab Results  Component Value Date   HGBA1C 5.3 01/05/2017   Patient is on Vitamin D supplement but remains below goal:    Lab Results  Component Value Date   VD25OH 40 01/05/2017      Current Medications:  Current Outpatient Medications on File Prior to Visit  Medication Sig  . aspirin 81 MG chewable tablet Chew 81 mg by mouth daily.  . Cholecalciferol (VITAMIN D-3) 5000 UNITS TABS Take by mouth daily.   . Coenzyme Q10 400 MG CAPS Take 1 capsule by mouth daily.  . enalapril (VASOTEC) 20 MG tablet TAKE ONE TABLET BY MOUTH ONCE DAILY  .  fish oil-omega-3 fatty acids 1000 MG capsule Take 1 g by mouth daily.    Marland Kitchen ibuprofen (ADVIL,MOTRIN) 200 MG tablet Take 200 mg by mouth every 6 (six) hours as needed.  Marland Kitchen levothyroxine (SYNTHROID, LEVOTHROID) 200 MCG tablet TAKE ONE TABLET BY MOUTH ONCE DAILY  . MAGNESIUM PO Take 250 mg by mouth daily.   . Multiple Vitamin (MULTIVITAMIN) tablet Take 1  tablet by mouth daily.  Marland Kitchen OVER THE COUNTER MEDICATION Takes MSM 1 tablet 1 daily for joints.  . potassium chloride SA (K-DUR,KLOR-CON) 20 MEQ tablet Take 20 mEq by mouth daily.  . nitroGLYCERIN (NITROLINGUAL) 0.4 MG/SPRAY spray Place 1 spray under the tongue every 5 (five) minutes as needed for chest pain.   No current facility-administered medications on file prior to visit.      Allergies:  Allergies  Allergen Reactions  . Prednisone      Medical History:  Past Medical History:  Diagnosis Date  . Abdominal aortic aneurysm (HCC)    3.3 cm  . Aortic ectasia (HCC)      no abdominal aortic aneurysm.  He is followed for this by   Dr. Curt Jews.  . Bone spur    and fused vertebrae.  He   is treated by Dr. Theda Sers.  Marland Kitchen CAD (coronary artery disease)    Cath March 2012. 95% left main stenosis. Patent LIMA to the LAD, patent saphenous vein graft to OM, patent saphenous vein graft to the right coronary artery.  . Constipated   . COPD (chronic obstructive pulmonary disease) ([place])   . Hernia   . Hyperlipidemia   . Hypertension   . Hypothyroidism   . Pre-diabetes   . PVD (peripheral vascular disease) (HCC)    (90% left iliac stenosis)  . Skin cancer    recent squamous cell carcinoma removed from right   index finger.  . Vitamin D deficiency    Family history- Reviewed and unchanged Social history- Reviewed and unchanged   Review of Systems:  Review of Systems  Constitutional: Negative for malaise/fatigue and weight loss.  HENT: Negative for hearing loss and tinnitus.   Eyes: Negative for blurred vision and double vision.  Respiratory: Positive for shortness of breath (Uses 2L O2 as needed - consistent with baseline). Negative for cough and wheezing.   Cardiovascular: Positive for leg swelling. Negative for chest pain, palpitations, orthopnea and claudication.  Gastrointestinal: Negative for abdominal pain, blood in stool, constipation, diarrhea, heartburn, melena, nausea and  vomiting.  Genitourinary: Negative.   Musculoskeletal: Negative for joint pain and myalgias.  Skin: Negative for rash.  Neurological: Negative for dizziness, tingling, sensory change, weakness and headaches.  Endo/Heme/Allergies: Negative for polydipsia.  Psychiatric/Behavioral: Negative.   All other systems reviewed and are negative.   Physical Exam: BP (!) 142/76   Pulse 95   Temp (!) 97.5 F (36.4 C)   Ht 5\' 10"  (1.778 m)   Wt 244 lb 6.4 oz (110.9 kg)   SpO2 97%   BMI 35.07 kg/m  Wt Readings from Last 3 Encounters:  04/15/17 244 lb 6.4 oz (110.9 kg)  01/05/17 245 lb 12.8 oz (111.5 kg)  09/17/16 247 lb (112 kg)   General Appearance: Well nourished, in no apparent distress. Eyes: PERRLA, EOMs, conjunctiva no swelling or erythema Sinuses: No Frontal/maxillary tenderness ENT/Mouth: Ext aud canals clear, TMs without erythema, bulging. No erythema, swelling, or exudate on post pharynx.  Tonsils not swollen or erythematous. Hearing normal.  Neck: Supple, thyroid normal.  Respiratory: Respiratory effort normal,  BS clear on L without rales, rhonchi, wheezing or stridor. R side with coarse bronchial sounds throughout - s/p pneumonectomy Cardio: RRR with no MRGs. 2+ pitting edema to bilateral ankles consistent with baseline, pulses difficult to palpate.   Abdomen: Soft, + BS.  Non tender, no guarding, rebound, hernias, masses. Lymphatics: Non tender without lymphadenopathy.  Musculoskeletal: Full ROM, 5/5 strength, Ambulates very slowly with walker  Skin: Warm, dry without rashes, lesions, ecchymosis.  Neuro: Cranial nerves intact. No cerebellar symptoms.  Psych: Awake and oriented X 3, normal affect, Insight and Judgment appropriate.    Izora Ribas, NP 11:38 AM [facility]

## 2017-04-15 ENCOUNTER — Ambulatory Visit (INDEPENDENT_AMBULATORY_CARE_PROVIDER_SITE_OTHER): Payer: Medicare Other | Admitting: Adult Health

## 2017-04-15 ENCOUNTER — Encounter: Payer: Self-pay | Admitting: Adult Health

## 2017-04-15 VITALS — BP 142/76 | HR 95 | Temp 97.5°F | Ht 70.0 in | Wt 244.4 lb

## 2017-04-15 DIAGNOSIS — Z79899 Other long term (current) drug therapy: Secondary | ICD-10-CM

## 2017-04-15 DIAGNOSIS — M159 Polyosteoarthritis, unspecified: Secondary | ICD-10-CM

## 2017-04-15 DIAGNOSIS — E039 Hypothyroidism, unspecified: Secondary | ICD-10-CM

## 2017-04-15 DIAGNOSIS — I10 Essential (primary) hypertension: Secondary | ICD-10-CM | POA: Diagnosis not present

## 2017-04-15 DIAGNOSIS — E559 Vitamin D deficiency, unspecified: Secondary | ICD-10-CM

## 2017-04-15 DIAGNOSIS — M150 Primary generalized (osteo)arthritis: Secondary | ICD-10-CM | POA: Diagnosis not present

## 2017-04-15 DIAGNOSIS — E782 Mixed hyperlipidemia: Secondary | ICD-10-CM | POA: Diagnosis not present

## 2017-04-15 DIAGNOSIS — I739 Peripheral vascular disease, unspecified: Secondary | ICD-10-CM | POA: Diagnosis not present

## 2017-04-15 DIAGNOSIS — M1990 Unspecified osteoarthritis, unspecified site: Secondary | ICD-10-CM | POA: Insufficient documentation

## 2017-04-15 DIAGNOSIS — R7309 Other abnormal glucose: Secondary | ICD-10-CM

## 2017-04-15 MED ORDER — DICLOFENAC SODIUM 1 % TD GEL: 4.0000 g | Freq: Four times a day (QID) | TRANSDERMAL | 3 refills | Status: DC

## 2017-04-16 LAB — CBC WITH DIFFERENTIAL/PLATELET
BASOS ABS: 30 {cells}/uL (ref 0–200)
Basophils Relative: 0.4 %
EOS ABS: 222 {cells}/uL (ref 15–500)
Eosinophils Relative: 3 %
HEMATOCRIT: 39.4 % (ref 38.5–50.0)
Hemoglobin: 13.4 g/dL (ref 13.2–17.1)
LYMPHS ABS: 2657 {cells}/uL (ref 850–3900)
MCH: 29.8 pg (ref 27.0–33.0)
MCHC: 34 g/dL (ref 32.0–36.0)
MCV: 87.8 fL (ref 80.0–100.0)
MPV: 9.3 fL (ref 7.5–12.5)
Monocytes Relative: 8.8 %
NEUTROS PCT: 51.9 %
Neutro Abs: 3841 cells/uL (ref 1500–7800)
Platelets: 208 10*3/uL (ref 140–400)
RBC: 4.49 10*6/uL (ref 4.20–5.80)
RDW: 13.5 % (ref 11.0–15.0)
TOTAL LYMPHOCYTE: 35.9 %
WBC: 7.4 10*3/uL (ref 3.8–10.8)
WBCMIX: 651 {cells}/uL (ref 200–950)

## 2017-04-16 LAB — BASIC METABOLIC PANEL WITH GFR
BUN: 15 mg/dL (ref 7–25)
CALCIUM: 9.2 mg/dL (ref 8.6–10.3)
CO2: 28 mmol/L (ref 20–32)
Chloride: 106 mmol/L (ref 98–110)
Creat: 0.82 mg/dL (ref 0.70–1.11)
GFR, EST AFRICAN AMERICAN: 90 mL/min/{1.73_m2} (ref >=60)
GFR, EST NON AFRICAN AMERICAN: 78 mL/min/{1.73_m2} (ref >=60)
Glucose, Bld: 87 mg/dL (ref 65–99)
POTASSIUM: 4 mmol/L (ref 3.5–5.3)
SODIUM: 142 mmol/L (ref 135–146)

## 2017-04-16 LAB — TSH: TSH: 8.72 mIU/L — ABNORMAL HIGH (ref 0.40–4.50)

## 2017-04-16 LAB — HEMOGLOBIN A1C
EAG (MMOL/L): 6 (calc)
Hgb A1c MFr Bld: 5.4 % of total Hgb (ref <5.7)
MEAN PLASMA GLUCOSE: 108 (calc)

## 2017-04-16 LAB — LIPID PANEL
CHOL/HDL RATIO: 3.5 (calc) (ref <5.0)
CHOLESTEROL: 209 mg/dL — AB (ref <200)
HDL: 59 mg/dL (ref >40)
LDL Cholesterol (Calc): 124 mg/dL (calc) — ABNORMAL HIGH
Non-HDL Cholesterol (Calc): 150 mg/dL (calc) — ABNORMAL HIGH (ref <130)
Triglycerides: 146 mg/dL (ref <150)

## 2017-04-16 LAB — HEPATIC FUNCTION PANEL
AG Ratio: 1.6 (calc) (ref 1.0–2.5)
ALBUMIN MSPROF: 4 g/dL (ref 3.6–5.1)
ALT: 12 U/L (ref 9–46)
AST: 14 U/L (ref 10–35)
Alkaline phosphatase (APISO): 83 U/L (ref 40–115)
BILIRUBIN DIRECT: 0.1 mg/dL (ref 0.0–0.2)
BILIRUBIN INDIRECT: 0.4 mg/dL (ref 0.2–1.2)
BILIRUBIN TOTAL: 0.5 mg/dL (ref 0.2–1.2)
Globulin: 2.5 g/dL (calc) (ref 1.9–3.7)
Total Protein: 6.5 g/dL (ref 6.1–8.1)

## 2017-04-19 ENCOUNTER — Other Ambulatory Visit: Payer: Self-pay | Admitting: Adult Health

## 2017-04-19 DIAGNOSIS — E039 Hypothyroidism, unspecified: Secondary | ICD-10-CM

## 2017-04-25 ENCOUNTER — Other Ambulatory Visit: Payer: Self-pay | Admitting: Internal Medicine

## 2017-05-21 ENCOUNTER — Ambulatory Visit (INDEPENDENT_AMBULATORY_CARE_PROVIDER_SITE_OTHER): Payer: Medicare Other | Admitting: *Deleted

## 2017-05-21 DIAGNOSIS — E039 Hypothyroidism, unspecified: Secondary | ICD-10-CM

## 2017-05-21 NOTE — Progress Notes (Signed)
Patient here for a  recheck of his TSH.  Per Liane Comber, DNP, the patient was to increase his Levothyroxine 200 mcg to 1 tablet 5 days a week and 1.5 tablets on Monday and Thursday.  The patient states he is taking 20 mcg every day.

## 2017-05-22 LAB — TSH: TSH: 5.52 m[IU]/L — AB (ref 0.40–4.50)

## 2017-07-20 ENCOUNTER — Encounter: Payer: Self-pay | Admitting: Internal Medicine

## 2017-07-20 ENCOUNTER — Ambulatory Visit (INDEPENDENT_AMBULATORY_CARE_PROVIDER_SITE_OTHER): Payer: Medicare Other | Admitting: Internal Medicine

## 2017-07-20 VITALS — BP 186/90 | HR 72 | Temp 97.3°F | Resp 20 | Ht 70.0 in | Wt 243.4 lb

## 2017-07-20 DIAGNOSIS — E559 Vitamin D deficiency, unspecified: Secondary | ICD-10-CM

## 2017-07-20 DIAGNOSIS — Z79899 Other long term (current) drug therapy: Secondary | ICD-10-CM | POA: Diagnosis not present

## 2017-07-20 DIAGNOSIS — R7309 Other abnormal glucose: Secondary | ICD-10-CM | POA: Diagnosis not present

## 2017-07-20 DIAGNOSIS — R7303 Prediabetes: Secondary | ICD-10-CM

## 2017-07-20 DIAGNOSIS — I10 Essential (primary) hypertension: Secondary | ICD-10-CM

## 2017-07-20 DIAGNOSIS — E039 Hypothyroidism, unspecified: Secondary | ICD-10-CM

## 2017-07-20 DIAGNOSIS — E782 Mixed hyperlipidemia: Secondary | ICD-10-CM

## 2017-07-20 MED ORDER — FUROSEMIDE 40 MG PO TABS: ORAL_TABLET | ORAL | 3 refills | Status: AC

## 2017-07-20 MED ORDER — ENALAPRIL MALEATE 20 MG PO TABS: ORAL_TABLET | ORAL | 3 refills | Status: AC

## 2017-07-20 NOTE — Patient Instructions (Signed)

## 2017-07-20 NOTE — Progress Notes (Signed)
This very nice 84 y.o.  MWM presents for 6 month follow up with HTN, HLD, Pre-Diabetes and Vitamin D Deficiency.  In 2004, patient underwent RLL lobectomy for Lung cancer and is w/o known recurrence. Patient becomes dyspneic after walking less than 20 feet. Patient has severe O2 dependent COPD.      Patient is treated for HTN (2005) & BP has been controlled at home.  He underwent CABG in 2010. Then heart cath in 2012 found patent grafts.  Today's BP is very elevated at 186/90.  Patient has had no complaints of any cardiac type chest pain, palpitations, dyspnea / orthopnea / PND, dizziness, claudication, or dependent edema.     Hyperlipidemia is controlled with diet & meds. Patient denies myalgias or other med SE's. Last Lipids were not at goal: Lab Results  Component Value Date   CHOL 209 (H) 04/15/2017   HDL 59 04/15/2017   LDLCALC 122 (H) 01/05/2017   TRIG 146 04/15/2017   CHOLHDL 3.5 04/15/2017      Also, the patient has history of PreDiabetes and has had no symptoms of reactive hypoglycemia, diabetic polys, paresthesias or visual blurring.  Last A1c was Normal & at goal: Lab Results  Component Value Date   HGBA1C 5.4 04/15/2017      Patient has been on Thyroid replacement since 2000. Further, the patient also has history of Vitamin D Deficiency and supplements vitamin D without any suspected side-effects. Last vitamin D was still low: Lab Results  Component Value Date   VD25OH 40 01/05/2017   Current Outpatient Medications on File Prior to Visit  Medication Sig  . aspirin 81 MG chewable tablet Chew 81 mg by mouth daily.  . Cholecalciferol (VITAMIN D-3) 5000 UNITS TABS Take by mouth daily.   . Coenzyme Q10 400 MG CAPS Take 1 capsule by mouth daily.  . fish oil-omega-3 fatty acids 1000 MG capsule Take 1 g by mouth daily.    Marland Kitchen ibuprofen (ADVIL,MOTRIN) 200 MG tablet Take 200 mg by mouth every 6 (six) hours as needed.  Marland Kitchen levothyroxine (SYNTHROID, LEVOTHROID) 200 MCG tablet TAKE 1  TABLET BY MOUTH ONCE DAILY  . MAGNESIUM PO Take 250 mg by mouth daily.   . Multiple Vitamin (MULTIVITAMIN) tablet Take 1 tablet by mouth daily.  . potassium chloride SA (K-DUR,KLOR-CON) 20 MEQ tablet Take 20 mEq by mouth daily.  . enalapril (VASOTEC) 20 MG tablet TAKE ONE TABLET BY MOUTH ONCE DAILY  . nitroGLYCERIN (NITROLINGUAL) 0.4 MG/SPRAY spray Place 1 spray under the tongue every 5 (five) minutes as needed for chest pain.   No current facility-administered medications on file prior to visit.    Allergies  Allergen Reactions  . Prednisone    PMHx:   Past Medical History:  Diagnosis Date  . Abdominal aortic aneurysm (HCC)    3.3 cm  . Aortic ectasia (HCC)      no abdominal aortic aneurysm.  He is followed for this by   Dr. Curt Jews.  . Bone spur    and fused vertebrae.  He   is treated by Dr. Theda Sers.  Marland Kitchen CAD (coronary artery disease)    Cath March 2012. 95% left main stenosis. Patent LIMA to the LAD, patent saphenous vein graft to OM, patent saphenous vein graft to the right coronary artery.  . Constipated   . COPD (chronic obstructive pulmonary disease) ([place])   . Hernia   . Hyperlipidemia   . Hypertension   . Hypothyroidism   .  Pre-diabetes   . PVD (peripheral vascular disease) (HCC)    (90% left iliac stenosis)  . Skin cancer    recent squamous cell carcinoma removed from right   index finger.  . Vitamin D deficiency    Immunization History  Administered Date(s) Administered  . DT 10/27/2013  . Influenza, High Dose Seasonal PF 02/05/2015, 02/28/2016  . Pneumococcal Conjugate-13 07/28/2014  . Pneumococcal Polysaccharide-23 12/25/2003, 07/25/2008  . Td 12/25/2003   Past Surgical History:  Procedure Laterality Date  . CATARACT EXTRACTION    . CORONARY ARTERY BYPASS GRAFT      2010 Median sternotomy, extracorporeal circulation,   coronary artery bypass graft surgery x3 using a left internal mammary   artery graft to left anterior descending coronary artery, with a     saphenous vein graft to the intermediate coronary artery, and a   saphenous vein graft to the right coronary artery.  Endoscopic vein   harvesting from the left leg.   Marland Kitchen KNEE SURGERY    . PILONIDAL CYST / SINUS EXCISION    . PNEUMONECTOMY     FHx:    Reviewed / unchanged  SHx:    Reviewed / unchanged  Systems Review:  Constitutional: Denies fever, chills, wt changes, headaches, insomnia, fatigue, night sweats, change in appetite. Eyes: Denies redness, blurred vision, diplopia, discharge, itchy, watery eyes.  ENT: Denies discharge, congestion, post nasal drip, epistaxis, sore throat, earache, hearing loss, dental pain, tinnitus, vertigo, sinus pain, snoring.  CV: Denies chest pain, palpitations, irregular heartbeat, syncope, dyspnea, diaphoresis, orthopnea, PND, claudication or edema. Respiratory: denies cough, dyspnea, DOE, pleurisy, hoarseness, laryngitis, wheezing.  Gastrointestinal: Denies dysphagia, odynophagia, heartburn, reflux, water brash, abdominal pain or cramps, nausea, vomiting, bloating, diarrhea, constipation, hematemesis, melena, hematochezia  or hemorrhoids. Genitourinary: Denies dysuria, frequency, urgency, nocturia, hesitancy, discharge, hematuria or flank pain. Musculoskeletal: Denies arthralgias, myalgias, stiffness, jt. swelling, pain, limping or strain/sprain.  Skin: Denies pruritus, rash, hives, warts, acne, eczema or change in skin lesion(s). Neuro: No weakness, tremor, incoordination, spasms, paresthesia or pain. Psychiatric: Denies confusion, memory loss or sensory loss. Endo: Denies change in weight, skin or hair change.  Heme/Lymph: No excessive bleeding, bruising or enlarged lymph nodes.  Physical Exam  BP (!) 186/90   Pulse 72   Temp (!) 97.3 F (36.3 C)   Resp 20   Ht 5\' 10"  (1.778 m)   Wt 243 lb 6.4 oz (110.4 kg)   BMI 34.92 kg/m   Appears  well nourished, well groomed  and in no distress.  Eyes: PERRLA, EOMs, conjunctiva no swelling or  erythema. Sinuses: No frontal/maxillary tenderness ENT/Mouth: EAC's clear, TM's nl w/o erythema, bulging. Nares clear w/o erythema, swelling, exudates. Oropharynx clear without erythema or exudates. Oral hygiene is good. Tongue normal, non obstructing. Hearing intact.  Neck: Supple. Thyroid not palpable. Car 2+/2+ without bruits, nodes or JVD. Chest: Increased AP diameter. Respirations nl with very distant BS w/o rales, rhonchi, wheezing or stridor.  Cor: Heart sounds normal w/ regular rate and rhythm without sig. murmurs, gallops, clicks or rubs. Pedal pulses 1+ / 1+  and equal  without edema.  Abdomen: Soft & bowel sounds normal. Non-tender w/o guarding, rebound, hernias, masses or organomegaly.  Lymphatics: Unremarkable.  Musculoskeletal: Full ROM all peripheral extremities, joint stability, 5/5 strength and normal gait.  Skin: Warm, dry without exposed rashes, lesions or ecchymosis apparent.  Neuro: Cranial nerves intact, reflexes equal bilaterally. Sensory-motor testing grossly intact. Tendon reflexes grossly intact.  Pysch: Alert & oriented x 3.  Insight and judgement nl & appropriate. No ideations.  Assessment and Plan:  1. Essential hypertension  - Continue medication, monitor blood pressure at home.  - Continue DASH diet. Reminder to go to the ER if any CP,  SOB, nausea, dizziness, severe HA, changes vision/speech.  - CBC with Differential/Platelet - BASIC METABOLIC PANEL WITH GFR - Magnesium - TSH - RESTART MEDS - enalapril (VASOTEC) 20 MG tablet; Take 1 tablet daily for BP & to prevent Stroke  Dispense: 90 tablet; Refill: 3 - furosemide (LASIX) 40 MG tablet; Take 1 tablet daily form BP & Fluid  Dispense: 90 tablet; Refill: 3  2. Hyperlipidemia, mixed  - Continue diet/meds, exercise,& lifestyle modifications.  - Continue monitor periodic cholesterol/liver & renal functions   - Hepatic function panel - Lipid panel - TSH  3. Abnormal glucose  - Continue diet,  exercise, lifestyle modifications.  - Monitor appropriate labs.  - Hemoglobin A1c  4. Vitamin D deficiency  - Continue supplementation.   - VITAMIN D 25 Hydroxyl  5. Prediabetes  - Hemoglobin A1c - Insulin, random  6. Hypothyroidism  - TSH  7. Medication management  - CBC with Differential/Platelet - BASIC METABOLIC PANEL WITH GFR - Hepatic function panel - Magnesium - Lipid panel - TSH - Hemoglobin A1c - Insulin, random - VITAMIN D 25 Hydroxyl      Discussed  regular exercise, BP monitoring, weight control to achieve/maintain BMI less than 25 and discussed med and SE's. Recommended labs to assess and monitor clinical status with further disposition pending results of labs. Over 30 minutes of exam, counseling, chart review was performed.

## 2017-07-21 LAB — CBC WITH DIFFERENTIAL/PLATELET
BASOS PCT: 0.8 %
Basophils Absolute: 62 cells/uL (ref 0–200)
EOS PCT: 2.5 %
Eosinophils Absolute: 193 cells/uL (ref 15–500)
HCT: 37.7 % — ABNORMAL LOW (ref 38.5–50.0)
Hemoglobin: 12.9 g/dL — ABNORMAL LOW (ref 13.2–17.1)
Lymphs Abs: 2464 cells/uL (ref 850–3900)
MCH: 29.6 pg (ref 27.0–33.0)
MCHC: 34.2 g/dL (ref 32.0–36.0)
MCV: 86.5 fL (ref 80.0–100.0)
MONOS PCT: 8.8 %
MPV: 9.7 fL (ref 7.5–12.5)
Neutro Abs: 4304 cells/uL (ref 1500–7800)
Neutrophils Relative %: 55.9 %
PLATELETS: 204 10*3/uL (ref 140–400)
RBC: 4.36 10*6/uL (ref 4.20–5.80)
RDW: 13.2 % (ref 11.0–15.0)
TOTAL LYMPHOCYTE: 32 %
WBC: 7.7 10*3/uL (ref 3.8–10.8)
WBCMIX: 678 {cells}/uL (ref 200–950)

## 2017-07-21 LAB — BASIC METABOLIC PANEL WITH GFR
BUN: 19 mg/dL (ref 7–25)
CALCIUM: 9.3 mg/dL (ref 8.6–10.3)
CHLORIDE: 110 mmol/L (ref 98–110)
CO2: 25 mmol/L (ref 20–32)
Creat: 0.87 mg/dL (ref 0.70–1.11)
GFR, Est African American: 88 mL/min/{1.73_m2} (ref >=60)
GFR, Est Non African American: 76 mL/min/{1.73_m2} (ref >=60)
GLUCOSE: 93 mg/dL (ref 65–99)
Potassium: 4.3 mmol/L (ref 3.5–5.3)
Sodium: 144 mmol/L (ref 135–146)

## 2017-07-21 LAB — MAGNESIUM: MAGNESIUM: 2 mg/dL (ref 1.5–2.5)

## 2017-07-21 LAB — HEMOGLOBIN A1C
Hgb A1c MFr Bld: 5.3 % of total Hgb (ref <5.7)
MEAN PLASMA GLUCOSE: 105 (calc)
eAG (mmol/L): 5.8 (calc)

## 2017-07-21 LAB — LIPID PANEL
Cholesterol: 206 mg/dL — ABNORMAL HIGH (ref <200)
HDL: 50 mg/dL (ref >40)
LDL Cholesterol (Calc): 131 mg/dL (calc) — ABNORMAL HIGH
Non-HDL Cholesterol (Calc): 156 mg/dL (calc) — ABNORMAL HIGH (ref <130)
Total CHOL/HDL Ratio: 4.1 (calc) (ref <5.0)
Triglycerides: 133 mg/dL (ref <150)

## 2017-07-21 LAB — HEPATIC FUNCTION PANEL
AG Ratio: 1.7 (calc) (ref 1.0–2.5)
ALKALINE PHOSPHATASE (APISO): 80 U/L (ref 40–115)
ALT: 12 U/L (ref 9–46)
AST: 14 U/L (ref 10–35)
Albumin: 4.1 g/dL (ref 3.6–5.1)
BILIRUBIN INDIRECT: 0.5 mg/dL (ref 0.2–1.2)
Bilirubin, Direct: 0.1 mg/dL (ref 0.0–0.2)
Globulin: 2.4 g/dL (calc) (ref 1.9–3.7)
Total Bilirubin: 0.6 mg/dL (ref 0.2–1.2)
Total Protein: 6.5 g/dL (ref 6.1–8.1)

## 2017-07-21 LAB — INSULIN, RANDOM: Insulin: 8.4 u[IU]/mL (ref 2.0–19.6)

## 2017-07-21 LAB — VITAMIN D 25 HYDROXY (VIT D DEFICIENCY, FRACTURES): Vit D, 25-Hydroxy: 57 ng/mL (ref 30–100)

## 2017-07-21 LAB — TSH: TSH: 2.63 m[IU]/L (ref 0.40–4.50)

## 2017-08-03 ENCOUNTER — Ambulatory Visit (INDEPENDENT_AMBULATORY_CARE_PROVIDER_SITE_OTHER): Payer: Medicare Other | Admitting: *Deleted

## 2017-08-03 DIAGNOSIS — I10 Essential (primary) hypertension: Secondary | ICD-10-CM | POA: Diagnosis not present

## 2017-08-03 NOTE — Progress Notes (Signed)
Patient is here for a NV to recheck his blood pressure.  He has restarted his Enalapril 20 mg and Furosemide 40 mg.  His BP this AM is 158/78 and pulse is 72.(last BP was 186/90).  Patient reports good output on Furosemide.

## 2017-08-24 DIAGNOSIS — 419620001 Death: Secondary | SNOMED CT | POA: Diagnosis not present

## 2017-08-24 DEATH — deceased

## 2017-11-09 ENCOUNTER — Ambulatory Visit: Payer: Self-pay | Admitting: Adult Health

## 2018-02-03 ENCOUNTER — Encounter: Payer: Self-pay | Admitting: Internal Medicine

## 2018-02-18 ENCOUNTER — Encounter: Payer: Self-pay | Admitting: Internal Medicine
